# Patient Record
Sex: Female | Born: 1940 | Race: Black or African American | Hispanic: No | Smoking: Former smoker
Health system: Southern US, Community
[De-identification: ages and names within clinical notes are randomized; demographics above are authoritative.]

## PROBLEM LIST (undated history)

## (undated) DIAGNOSIS — I10 Essential (primary) hypertension: Secondary | ICD-10-CM

## (undated) DIAGNOSIS — E785 Hyperlipidemia, unspecified: Secondary | ICD-10-CM

## (undated) DIAGNOSIS — M1990 Unspecified osteoarthritis, unspecified site: Secondary | ICD-10-CM

## (undated) DIAGNOSIS — H269 Unspecified cataract: Secondary | ICD-10-CM

## (undated) DIAGNOSIS — C801 Malignant (primary) neoplasm, unspecified: Secondary | ICD-10-CM

## (undated) HISTORY — DX: Hyperlipidemia, unspecified: E78.5

## (undated) HISTORY — PX: UPPER GASTROINTESTINAL ENDOSCOPY: SHX188

## (undated) HISTORY — DX: Essential (primary) hypertension: I10

## (undated) HISTORY — DX: Unspecified cataract: H26.9

## (undated) HISTORY — DX: Malignant (primary) neoplasm, unspecified: C80.1

## (undated) HISTORY — PX: COLONOSCOPY: SHX174

## (undated) HISTORY — DX: Unspecified osteoarthritis, unspecified site: M19.90

## (undated) HISTORY — PX: OTHER SURGICAL HISTORY: SHX169

---

## 2020-03-28 HISTORY — PX: ESOPHAGOGASTRODUODENOSCOPY: SHX1529

## 2020-05-24 ENCOUNTER — Encounter: Payer: Self-pay | Admitting: *Deleted

## 2020-05-24 NOTE — Progress Notes (Signed)
Reached out to Vanessa Garrett, and spoke to her daughter, [name], to introduce myself as the office RN Navigator and explain our new patient process. Reviewed the reason for their referral and scheduled their new patient appointment along with labs. Provided address and directions to the office including call back phone number. Reviewed with patient any concerns they may have or any possible barriers to attending their appointment.   Records from recent hospitalization in [place] obtained and printed for Dr Marin Olp.   Oncology Nurse Navigator Documentation  Oncology Nurse Navigator Flowsheets 05/24/2020  Abnormal Finding Date 05/17/2020  Navigator Follow Up Date: 05/28/2020  Navigator Follow Up Reason: New Patient Appointment  Navigator Location [facility]  Referral Date to RadOnc/MedOnc 05/21/2020  Navigator Encounter Type Introductory Phone Call  Patient Visit Type MedOnc  Treatment Phase Abnormal Labs;Abnormal Scans  Barriers/Navigation Needs Coordination of Care;Education  Education Other  Interventions Coordination of Care;Education  Acuity Level 2-Minimal Needs (1-2 Barriers Identified)  Coordination of Care Appts  Education Method Verbal  Support Groups/Services Friends and Family  Time Spent with Patient 48

## 2020-05-28 ENCOUNTER — Inpatient Hospital Stay (HOSPITAL_BASED_OUTPATIENT_CLINIC_OR_DEPARTMENT_OTHER): Payer: Medicare Other | Admitting: Hematology & Oncology

## 2020-05-28 ENCOUNTER — Encounter: Payer: Self-pay | Admitting: Hematology & Oncology

## 2020-05-28 ENCOUNTER — Inpatient Hospital Stay: Payer: Medicare Other | Attending: Hematology & Oncology

## 2020-05-28 ENCOUNTER — Other Ambulatory Visit: Payer: Self-pay

## 2020-05-28 ENCOUNTER — Encounter: Payer: Self-pay | Admitting: *Deleted

## 2020-05-28 VITALS — BP 173/70 | HR 80 | Temp 98.6°F | Resp 18 | Ht 65.5 in | Wt 175.0 lb

## 2020-05-28 DIAGNOSIS — C221 Intrahepatic bile duct carcinoma: Secondary | ICD-10-CM

## 2020-05-28 DIAGNOSIS — R160 Hepatomegaly, not elsewhere classified: Secondary | ICD-10-CM

## 2020-05-28 DIAGNOSIS — Z79899 Other long term (current) drug therapy: Secondary | ICD-10-CM | POA: Insufficient documentation

## 2020-05-28 DIAGNOSIS — C787 Secondary malignant neoplasm of liver and intrahepatic bile duct: Secondary | ICD-10-CM | POA: Insufficient documentation

## 2020-05-28 DIAGNOSIS — C779 Secondary and unspecified malignant neoplasm of lymph node, unspecified: Secondary | ICD-10-CM | POA: Diagnosis not present

## 2020-05-28 DIAGNOSIS — Z7189 Other specified counseling: Secondary | ICD-10-CM

## 2020-05-28 DIAGNOSIS — K769 Liver disease, unspecified: Secondary | ICD-10-CM | POA: Diagnosis not present

## 2020-05-28 DIAGNOSIS — D500 Iron deficiency anemia secondary to blood loss (chronic): Secondary | ICD-10-CM

## 2020-05-28 DIAGNOSIS — R978 Other abnormal tumor markers: Secondary | ICD-10-CM | POA: Insufficient documentation

## 2020-05-28 DIAGNOSIS — D509 Iron deficiency anemia, unspecified: Secondary | ICD-10-CM | POA: Diagnosis not present

## 2020-05-28 DIAGNOSIS — Z87891 Personal history of nicotine dependence: Secondary | ICD-10-CM | POA: Diagnosis not present

## 2020-05-28 DIAGNOSIS — IMO0001 Reserved for inherently not codable concepts without codable children: Secondary | ICD-10-CM | POA: Insufficient documentation

## 2020-05-28 DIAGNOSIS — C23 Malignant neoplasm of gallbladder: Secondary | ICD-10-CM | POA: Diagnosis not present

## 2020-05-28 DIAGNOSIS — Z789 Other specified health status: Secondary | ICD-10-CM

## 2020-05-28 DIAGNOSIS — K909 Intestinal malabsorption, unspecified: Secondary | ICD-10-CM | POA: Insufficient documentation

## 2020-05-28 HISTORY — DX: Iron deficiency anemia secondary to blood loss (chronic): D50.0

## 2020-05-28 HISTORY — DX: Other specified counseling: Z71.89

## 2020-05-28 HISTORY — DX: Other specified health status: Z78.9

## 2020-05-28 HISTORY — DX: Reserved for inherently not codable concepts without codable children: IMO0001

## 2020-05-28 HISTORY — DX: Hepatomegaly, not elsewhere classified: R16.0

## 2020-05-28 HISTORY — DX: Intestinal malabsorption, unspecified: K90.9

## 2020-05-28 LAB — CMP (CANCER CENTER ONLY)
ALT: 6 U/L (ref 0–44)
AST: 15 U/L (ref 15–41)
Albumin: 3.7 g/dL (ref 3.5–5.0)
Alkaline Phosphatase: 109 U/L (ref 38–126)
Anion gap: 7 (ref 5–15)
BUN: 14 mg/dL (ref 8–23)
CO2: 31 mmol/L (ref 22–32)
Calcium: 9.3 mg/dL (ref 8.9–10.3)
Chloride: 100 mmol/L (ref 98–111)
Creatinine: 0.72 mg/dL (ref 0.44–1.00)
GFR, Est AFR Am: >60 mL/min (ref >60)
GFR, Estimated: >60 mL/min (ref >60)
Glucose, Bld: 111 mg/dL — ABNORMAL HIGH (ref 70–99)
Potassium: 3.3 mmol/L — ABNORMAL LOW (ref 3.5–5.1)
Sodium: 138 mmol/L (ref 135–145)
Total Bilirubin: 0.2 mg/dL — ABNORMAL LOW (ref 0.3–1.2)
Total Protein: 7 g/dL (ref 6.5–8.1)

## 2020-05-28 LAB — CBC WITH DIFFERENTIAL (CANCER CENTER ONLY)
Abs Immature Granulocytes: 0.07 10*3/uL (ref 0.00–0.07)
Basophils Absolute: 0.1 10*3/uL (ref 0.0–0.1)
Basophils Relative: 1 %
Eosinophils Absolute: 0.1 10*3/uL (ref 0.0–0.5)
Eosinophils Relative: 1 %
HCT: 31.7 % — ABNORMAL LOW (ref 36.0–46.0)
Hemoglobin: 9.3 g/dL — ABNORMAL LOW (ref 12.0–15.0)
Immature Granulocytes: 1 %
Lymphocytes Relative: 21 %
Lymphs Abs: 1.1 10*3/uL (ref 0.7–4.0)
MCH: 23.7 pg — ABNORMAL LOW (ref 26.0–34.0)
MCHC: 29.3 g/dL — ABNORMAL LOW (ref 30.0–36.0)
MCV: 80.9 fL (ref 80.0–100.0)
Monocytes Absolute: 0.5 10*3/uL (ref 0.1–1.0)
Monocytes Relative: 10 %
Neutro Abs: 3.4 10*3/uL (ref 1.7–7.7)
Neutrophils Relative %: 66 %
Platelet Count: 335 10*3/uL (ref 150–400)
RBC: 3.92 MIL/uL (ref 3.87–5.11)
RDW: 16.6 % — ABNORMAL HIGH (ref 11.5–15.5)
WBC Count: 5.2 10*3/uL (ref 4.0–10.5)
nRBC: 0 % (ref 0.0–0.2)

## 2020-05-28 LAB — RETICULOCYTES
Immature Retic Fract: 10 % (ref 2.3–15.9)
RBC.: 3.88 MIL/uL (ref 3.87–5.11)
Retic Count, Absolute: 46.9 10*3/uL (ref 19.0–186.0)
Retic Ct Pct: 1.2 % (ref 0.4–3.1)

## 2020-05-28 LAB — PREALBUMIN: Prealbumin: 15.3 mg/dL — ABNORMAL LOW (ref 18–38)

## 2020-05-28 NOTE — Progress Notes (Signed)
Referral MD  Reason for Referral: Hepatobiliary tumor-biopsy results still pending; severe iron deficiency anemia-Jehovah's Witness  Chief Complaint  Patient presents with  . Follow-up  : I had very low blood and was in the hospital for 3 weeks.  HPI: [name] is an incredibly charming 79 year old African-American female.  She comes in with a daughter.  A son and daughter-in-law are on the cell phone.  She has recently moved down here from [place].  As such, we had a great time talking about [place] since I went to medical school in [place] and lived in [place].  She apparently was incredibly weak and had to go to the hospital.  This was in July.  She was found to have a hemoglobin of 2.3.  I suspect she was GI bleeding.  She only had an upper endoscopy.  I am not sure why colonoscopy was not done.  She is a Restaurant manager, fast food.  She cannot receive blood.  She was given ESA and iron.  This has helped.  While hospitalized, she had a CT scan of the abdomen.  This showed a mass in the liver.  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  A stack of records came with her.  Unfortunately I cannot find anything in the records about tumor markers or biopsies.  Her daughter said that she did have a biopsy of the liver as an outpatient.  We will have to call up to her hospital and her oncologist to see if we can get information.  She looks quite healthy.  She obviously has a strong constitution to be able to manage a hemoglobin of 2.3.  She has had no real weight loss.  I think she may have lost weight while in the hospital.  She has had a decreased appetite.  She says that food just does not taste all that good.  She has had no diarrhea.  She used to smoke but stopped over 50 years ago.  Quality of life clearly is our primary goal.  Currently, her performance status is ECOG 1.   History reviewed. No pertinent past medical  history.:  History reviewed. No pertinent surgical history.:   Current Outpatient Medications:  .  Iron-Vitamins (S.S.S. TONIC PO), Take 45 mLs by mouth every other day., Disp: , Rfl:  .  NON FORMULARY, Take 2 tablets by mouth every other day. Mega Food - Blood Builder, Disp: , Rfl:  .  NON FORMULARY, 1.25 mg every other day. Beet Root Powder, Disp: , Rfl:  .  metoprolol tartrate (LOPRESSOR) 12.5 mg TABS tablet, Take 12.5 mg by mouth 2 (two) times daily., Disp: , Rfl:  .  vitamin B-12 (CYANOCOBALAMIN) 1000 MCG tablet, Take 1,000 mcg by mouth daily., Disp: , Rfl: :  :  No Known Allergies:  History reviewed. No pertinent family history.:  Social History   Socioeconomic History  . Marital status: Divorced    Spouse name: Not on file  . Number of children: Not on file  . Years of education: Not on file  . Highest education level: Not on file  Occupational History  . Not on file  Tobacco Use  . Smoking status: Former Smoker    Packs/day: 0.25    Years: 10.00    Pack years: 2.50    Types: Cigarettes    Quit date: 08/26/1969    Years since quitting: 50.7  . Smokeless tobacco: Never Used  Vaping Use  . Vaping Use:  Never used  Substance and Sexual Activity  . Alcohol use: Not on file  . Drug use: Not on file  . Sexual activity: Not on file  Other Topics Concern  . Not on file  Social History Narrative  . Not on file   Social Determinants of Health   Financial Resource Strain:   . Difficulty of Paying Living Expenses: Not on file  Food Insecurity:   . Worried About Charity fundraiser in the Last Year: Not on file  . Ran Out of Food in the Last Year: Not on file  Transportation Needs:   . Lack of Transportation (Medical): Not on file  . Lack of Transportation (Non-Medical): Not on file  Physical Activity:   . Days of Exercise per Week: Not on file  . Minutes of Exercise per Session: Not on file  Stress:   . Feeling of Stress : Not on file  Social Connections:   .  Frequency of Communication with Friends and Family: Not on file  . Frequency of Social Gatherings with Friends and Family: Not on file  . Attends Religious Services: Not on file  . Active Member of Clubs or Organizations: Not on file  . Attends Archivist Meetings: Not on file  . Marital Status: Not on file  Intimate Partner Violence:   . Fear of Current or Ex-Partner: Not on file  . Emotionally Abused: Not on file  . Physically Abused: Not on file  . Sexually Abused: Not on file  :  Review of Systems  Constitutional: Positive for malaise/fatigue.  HENT: Negative.   Eyes: Negative.   Respiratory: Negative.   Cardiovascular: Negative.   Gastrointestinal: Negative.   Genitourinary: Negative.   Musculoskeletal: Negative.   Skin: Negative.   Neurological: Negative.   Endo/Heme/Allergies: Negative.   Psychiatric/Behavioral: Negative.      Exam:  Well-developed well-nourished African-American female in no obvious distress.  Vital signs show temperature of 98.6.  Pulse 80.  Blood pressure 173/70.  Weight is 175 pounds.  Head and neck exam shows no ocular or oral lesions.  Conjunctiva are slightly pale.  She has no scleral icterus.  There is no adenopathy in the neck or supraclavicular regions.  Lungs are clear bilaterally.  Cardiac exam regular rate and rhythm with no murmurs, rubs or bruits.  Abdomen is soft.  She has decent bowel sounds.  Her liver edge is palpable about 3 to 4 cm below the right costal margin.  There is no palpable spleen tip.  There is no fluid wave in the abdomen.  Back exam shows no tenderness over the spine, ribs or hips.  Extremities shows no clubbing or cyanosis.  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  She has decent range of motion of her joints.  She has good strength bilaterally.  Skin exam shows no rashes, ecchymoses or petechia.  Neurological exam shows no focal neurological deficits.   @IPVITALS @   Recent Labs    05/28/20 1528  WBC  5.2  HGB 9.3*  HCT 31.7*  PLT 335   Recent Labs    05/28/20 1528  NA 138  K 3.3*  CL 100  CO2 31  GLUCOSE 111*  BUN 14  CREATININE 0.72  CALCIUM 9.3    Blood smear review: None  Pathology: Pending    Assessment and Plan: [name] is a very charming 79 year old African-American female.  She has the main problem being this hepatic mass.  Again she had a  biopsy done.  Will have to speak to her oncologist up in [place] and see if we can get information about this mass.  If it is a primary liver or biliary cancer, we really need to make sure molecular markers have been sent.  We will have to get scans down here so we know what is going on.  I will get an MRI of her abdomen and a CT of her chest.  This will give [place] an idea as to how extensive this tumor involves her liver.  We did send off some tumor markers.  We will see what this shows.  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  She has been given IV iron.  We will go ahead and give her IV iron.  She may or may not need all ESA.  I think that what ever we are dealing with in the liver, we are not going to cure.  We can certainly treat this but we really need to know what the pathology is.  Again our primary goal is quality of life.  I want to make sure that we focus on that for Ms. Alroy Dust.  Of note, she will need a colonoscopy.  We will have to see if the gastroenterologists across the hall will be able to see her and do a colonoscopy quickly.  We will set her up with IV iron.  We will do this weekly.  Again, we may or may not need ESA.  I spent a good 60 minutes with Ms. Hester and her daughter.  I answered all their questions.  She is delightful.  It was really nice talking to her.  She has a very strong faith.  I did give her a prayer blanket and she was grateful for this.  We will plan to get her back probably in a few weeks depending on what we find with the  biopsy results up in [place].

## 2020-05-28 NOTE — Progress Notes (Signed)
Initial RN Navigator Patient Visit  Name: Vanessa Garrett Date of Referral : 05/21/20 Diagnosis: Cholangiocarcinoma  Met with patient prior to their visit with MD. Hanley Seamen patient MD and Navigator business card. Reviewed with patient the general overview of expected course after initial diagnosis and time frame for all steps to be completed.  Patient completed visit with Dr. Marin Olp  Will review patient's appointment when I return to the office on Tuesday and continue to coordinate all patient needs.   Patient understands all follow up procedures and expectations. They have my number to reach out for any further clarification or additional needs.   Oncology Nurse Navigator Documentation  Oncology Nurse Navigator Flowsheets 05/28/2020  Abnormal Finding Date -  Navigator Follow Up Date: 06/01/2020  Navigator Follow Up Reason: Appointment Review  Navigator Location [facility]  Referral Date to RadOnc/MedOnc -  Navigator Encounter Type Initial MedOnc  Patient Visit Type MedOnc  Treatment Phase Pre-Tx/Tx Discussion  Barriers/Navigation Needs Coordination of Care;Education  Education Other  Interventions Coordination of Care;Education  Acuity Level 2-Minimal Needs (1-2 Barriers Identified)  Coordination of Care -  Education Method Verbal  Support Groups/Services Friends and Family  Time Spent with Patient 30

## 2020-05-30 LAB — IRON AND TIBC
Iron: 91 ug/dL (ref 28–170)
Saturation Ratios: 26 % (ref 10.4–31.8)
TIBC: 353 ug/dL (ref 250–450)
UIBC: 262 ug/dL

## 2020-05-30 LAB — FERRITIN: Ferritin: 48 ng/mL (ref 11–307)

## 2020-05-31 ENCOUNTER — Other Ambulatory Visit: Payer: Self-pay

## 2020-05-31 DIAGNOSIS — Z1211 Encounter for screening for malignant neoplasm of colon: Secondary | ICD-10-CM

## 2020-05-31 DIAGNOSIS — K921 Melena: Secondary | ICD-10-CM

## 2020-05-31 LAB — CEA (IN HOUSE-CHCC): CEA (CHCC-In House): 2.06 ng/mL (ref 0.00–5.00)

## 2020-06-01 ENCOUNTER — Encounter: Payer: Self-pay | Admitting: *Deleted

## 2020-06-01 LAB — CANCER ANTIGEN 19-9: CA 19-9: 2205 U/mL — ABNORMAL HIGH (ref 0–35)

## 2020-06-01 NOTE — Progress Notes (Signed)
Reviewed patient's new patient appointment.   Dr Marin Olp would like to talk to Dr Courtney Paris with [facility] in [place]. Able to connect them via the telephone. Dr Marin Olp would like [place] to determine if molecular studies have been sent. We received a message from [place] with Dr Courtney Paris that [place] had been sent on the sample, and that we should have results in 7-10 days. Contact: Wells Guiles [phone])  Patient needs a CT and MRI. Radiology called because patient is receiving IV iron and they prefer to scheduled MRIs at least 3 months out from IV iron. Called and spoke to Dr Kris Hartmann at [facility] and discussed the case with him. 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 We cannot stop for 3 months. He agreed that in this case, we should proceed with the MRI, being aware that we may have suboptimal images. Notified the radiology department and they will schedule.   Oncology Nurse Navigator Documentation  Oncology Nurse Navigator Flowsheets 06/01/2020  Abnormal Finding Date -  Navigator Follow Up Date: 06/28/2020  Navigator Follow Up Reason: Follow-up Appointment  Navigator Location [facility]  Referral Date to RadOnc/MedOnc -  Navigator Encounter Type Appt/Treatment Plan Review;Telephone  Telephone Outgoing Call  Patient Visit Type MedOnc  Treatment Phase Pre-Tx/Tx Discussion  Barriers/Navigation Needs Coordination of Care;Education  Education -  Interventions Coordination of Care;Other  Acuity Level 2-Minimal Needs (1-2 Barriers Identified)  Coordination of Care Appts;Radiology;Other  Education Method -  Support Groups/Services Friends and Family  Time Spent with Patient 8

## 2020-06-02 ENCOUNTER — Ambulatory Visit (INDEPENDENT_AMBULATORY_CARE_PROVIDER_SITE_OTHER): Payer: Medicare Other | Admitting: Gastroenterology

## 2020-06-02 ENCOUNTER — Encounter: Payer: Self-pay | Admitting: Gastroenterology

## 2020-06-02 VITALS — BP 142/68 | HR 84 | Ht 65.5 in | Wt 175.1 lb

## 2020-06-02 DIAGNOSIS — K921 Melena: Secondary | ICD-10-CM | POA: Diagnosis not present

## 2020-06-02 DIAGNOSIS — C249 Malignant neoplasm of biliary tract, unspecified: Secondary | ICD-10-CM | POA: Diagnosis not present

## 2020-06-02 DIAGNOSIS — D509 Iron deficiency anemia, unspecified: Secondary | ICD-10-CM | POA: Diagnosis not present

## 2020-06-02 NOTE — Patient Instructions (Signed)
If you are age 79 or older, your body mass index should be between 23-30. Your Body mass index is 28.7 kg/m. If this is out of the aforementioned range listed, please consider follow up with your Primary Care Provider.  If you are age 2 or younger, your body mass index should be between 19-25. Your Body mass index is 28.7 kg/m. If this is out of the aformentioned range listed, please consider follow up with your Primary Care Provider.   You have been scheduled for a colonoscopy. Please follow written instructions given to you at your visit today.  Please pick up your prep supplies at the pharmacy within the next 1-3 days. If you use inhalers (even only as needed), please bring them with you on the day of your procedure.  You have been given a Clenpiq prep.  It was a pleasure to see you today!  Vito Cirigliano, D.O.

## 2020-06-02 NOTE — Progress Notes (Signed)
Chief Complaint: Iron deficiency anemia, hepatobiliary tumor   Referring Provider:     Burney Gauze, MD   HPI:     Vanessa Garrett is a 79 y.o. female referred to the Gastroenterology Clinic for evaluation of iron deficiency anemia and recently diagnosed hepatobiliary tumor.  She was hospitalized 03/16/2020 with weakness, fatigue, symptomatic anemia with admission hemoglobin of 2.8.  Per patient, was hospitalized x3 weeks, treated with Epo, IV iron. Was evaluated with EGD as below, but colonoscopy was not done.  She was discharged with hemoglobin 8.3, and has since moved from the [place] to [facility].  -CTA abdomen/pelvis (03/16/2020): 1. Extensive hepatobiliary abnormalities as described consistent with malignancy. Amorphous intraductal mass centered at the hepatic hilum and extending throughout the extrahepatic biliary ducts to the level of the ampulla, and within the gallbladder, most likely cholangiocarcinoma or less likely gallbladder carcinoma with extension into the ducts.  2. Associated severe obstructive intrahepatic ductal dilatation.  3. 2 ill-defined masses in the medial aspect of the right hepatic lobe measuring up to4.6 cm, consistent with metastatic disease.  4. Enlarged portacaval lymph node which may be metastatic.  5. No evidence of active arterial GI bleeding.  6. Fibroid uterus. Small left adnexal cyst which could be followed up with ultrasound in 12 months.  7. Cardiomegaly. Evidence of anemia.  -EGD (04/05/2020): Normal  -Ultrasound-guided liver biopsy (05/10/2020): Moderate to poorly differentiated adenocarcinoma.  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 Marin Olp in the Oncology clinic on 05/28/2020.  Send off tumor markers/labs as below and additional imaging with MRI abdomen and CT chest (pending).  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  Referred to GI clinic for expedited  colonoscopy which is scheduled for tomorrow.  -CEA normal -CA 19-9 2205 -Ferritin 48, iron 91, TIBC 353, sat 26% -H/H 9.3/31.7 with MCV/RDW 81/16.6 -K 3.3 otherwise normal CMP  Today, states prior to hospital admission had intermittent dark stools, then brisk marroon stools just prior to admission. No prior similar sxs. No prior colonsocopy.  Since hospital discharge, has had intermittent small-volume hematochezia.  No known family history of CRC, GI malignancy, liver disease, pancreatic disease, or IBD.    Past Medical History:  Diagnosis Date  . Goals of care, counseling/discussion 05/28/2020  . 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  . 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 Liver mass 05/28/2020  . Patient is Jehovah's Witness 05/28/2020     Past Surgical History:  Procedure Laterality Date  . ESOPHAGOGASTRODU[facility]  [facility]   Family History  Problem Relation Age of Onset  . Cancer Maternal Aunt        not sure the type  . Colon cancer Neg Hx    Social History   Tobacco Use  . Smoking status: Former Smoker    Packs/day: 0.25    Years: 10.00    Pack years: 2.50    Types: Cigarettes    Quit date: 08/26/1969    Years since quitting: 50.8  . Smokeless tobacco: Never Used  Vaping Use  . Vaping Use: Never used  Substance Use Topics  . Alcohol use: Not Currently  . Drug use: Never   Current Outpatient Medications  Medication Sig Dispense Refill  . Iron-Vitamins (S.S.S. TONIC PO) Take 45 mLs by mouth every other day.    . metoprolol tartrate (  LOPRESSOR) 25 MG tablet Take 12.5 mg by mouth 2 (two) times daily.    . NON FORMULARY Take 2 tablets by mouth every other day. Mega Food - Blood Builder    . NON FORMULARY 1.25 mg every other day. Beet Root Powder    . vitamin B-12 (CYANOCOBALAMIN) 1000 MCG tablet Take 1,000 mcg by mouth daily.    Marland Kitchen VITAMIN D PO Take 2 tablets by mouth daily.     No current facility-administered  medications for this visit.   No Known Allergies   Review of Systems: All systems reviewed and negative except where noted in HPI.     Physical Exam:    Wt Readings from Last 3 Encounters:  06/02/20 175 lb 2 oz (79.4 kg)  05/28/20 175 lb (79.4 kg)    BP (!) 142/68   Pulse 84   Ht 5' 5.5" (1.664 m)   Wt 175 lb 2 oz (79.4 kg)   BMI 28.70 kg/m  Constitutional:  Pleasant, in no acute distress. Psychiatric: Normal mood and affect. Behavior is normal. EENT: Pupils normal.  Conjunctivae are normal. No scleral icterus. Neck supple. No cervical LAD. Cardiovascular: Normal rate, regular rhythm. No edema Pulmonary/chest: Effort normal and breath sounds normal. No wheezing, rales or rhonchi. Abdominal: Palpable mass with TTP in RUQ.  No rebound or guarding.  No peritoneal signs.  Soft, nondistended. Bowel sounds active throughout. There are no masses palpable. No hepatomegaly. Neurological: Alert and oriented to person place and time. Skin: Skin is warm and dry. No rashes noted. Rectal: Exam deferred by patient to time of colonoscopy.    ASSESSMENT AND PLAN;   1) Iron deficiency anemia 2) Hematochezia -Plan for colonoscopy as scheduled tomorrow for completion of IDA work-up along with evaluation of hematochezia -Continue serial CBC checks with additional IV iron as needed -Patient is Jehovah's Witness  3) Hepatobiliary tumor with apparent regional metastasis -Was seen in the Oncology clinic by Dr. Marin Olp last week.  Biopsies requested and I was able to review some of those records from [place] today.  Liver biopsy consistent with hepatobiliary adenocarcinoma -CMP otherwise normal and without evidence of obstruction   The indications, risks, and benefits of colonoscopy were explained to the patient in detail. Risks include but are not limited to bleeding, perforation, adverse reaction to medications, and cardiopulmonary compromise. Sequelae include but are not limited to the  possibility of surgery, hospitalization, and mortality. The patient verbalized understanding and wished to proceed. All questions answered, referred to the scheduler and bowel prep ordered. Further recommendations pending results of the exam.     Lavena Bullion, DO, FACG  06/02/2020, 10:31 AM   Einar Pheasant, DO

## 2020-06-03 ENCOUNTER — Encounter: Payer: Self-pay | Admitting: Gastroenterology

## 2020-06-03 ENCOUNTER — Other Ambulatory Visit: Payer: Self-pay

## 2020-06-03 ENCOUNTER — Ambulatory Visit (AMBULATORY_SURGERY_CENTER): Payer: Medicare Other | Admitting: Gastroenterology

## 2020-06-03 VITALS — BP 164/88 | HR 68 | Temp 97.9°F | Resp 21 | Ht 65.5 in | Wt 175.0 lb

## 2020-06-03 DIAGNOSIS — K641 Second degree hemorrhoids: Secondary | ICD-10-CM

## 2020-06-03 DIAGNOSIS — K921 Melena: Secondary | ICD-10-CM | POA: Diagnosis not present

## 2020-06-03 DIAGNOSIS — D128 Benign neoplasm of rectum: Secondary | ICD-10-CM

## 2020-06-03 DIAGNOSIS — D509 Iron deficiency anemia, unspecified: Secondary | ICD-10-CM

## 2020-06-03 DIAGNOSIS — K635 Polyp of colon: Secondary | ICD-10-CM

## 2020-06-03 DIAGNOSIS — D125 Benign neoplasm of sigmoid colon: Secondary | ICD-10-CM

## 2020-06-03 DIAGNOSIS — K5730 Diverticulosis of large intestine without perforation or abscess without bleeding: Secondary | ICD-10-CM

## 2020-06-03 MED ORDER — SODIUM CHLORIDE 0.9 % IV SOLN: 500.0000 mL | Freq: Once | INTRAVENOUS | Status: DC

## 2020-06-03 NOTE — Progress Notes (Signed)
Called daughter to bedside, pt w/o complaints, passing flatus, no bleeding.  Explained pt has 2 clips per rectum and clip card given. Instructions

## 2020-06-03 NOTE — Progress Notes (Signed)
To PACU, VSS. Report to [email or web address] 

## 2020-06-03 NOTE — Patient Instructions (Signed)
Handouts given for diverticulosis, hemorrhoids and polyps.  YOU HAD AN ENDOSCOPIC PROCEDURE TODAY AT [facility]:   Refer to the procedure report that was given to you for any specific questions about what was found during the examination.  If the procedure report does not answer your questions, please call your gastroenterologist to clarify.  If you requested that your care partner not be given the details of your procedure findings, then the procedure report has been included in a sealed envelope for you to review at your convenience later.  YOU SHOULD EXPECT: Some feelings of bloating in the abdomen. Passage of more gas than usual.  Walking can help get rid of the air that was put into your GI tract during the procedure and reduce the bloating. If you had a lower endoscopy (such as a colonoscopy or flexible sigmoidoscopy) you may notice spotting of blood in your stool or on the toilet paper. If you underwent a bowel prep for your procedure, you may not have a normal bowel movement for a few days.  Please Note:  You might notice some irritation and congestion in your nose or some drainage.  This is from the oxygen used during your procedure.  There is no need for concern and it should clear up in a day or so.  SYMPTOMS TO REPORT IMMEDIATELY:   Following lower endoscopy (colonoscopy or flexible sigmoidoscopy):  Excessive amounts of blood in the stool  Significant tenderness or worsening of abdominal pains  Swelling of the abdomen that is new, acute  Fever of 100F or higher  For urgent or emergent issues, a gastroenterologist can be reached at any hour by calling [phone]. Do not use MyChart messaging for urgent concerns.    DIET:  We do recommend a small meal at first, but then you may proceed to your regular diet.  Drink plenty of fluids but you should avoid alcoholic beverages for 24 hours.  ACTIVITY:  You should plan to take it easy for the rest of today and you  should NOT DRIVE or use heavy machinery until tomorrow (because of the sedation medicines used during the test).    FOLLOW UP: Our staff will call the number listed on your records 48-72 hours following your procedure to check on you and address any questions or concerns that you may have regarding the information given to you following your procedure. If we do not reach you, we will leave a message.  We will attempt to reach you two times.  During this call, we will ask if you have developed any symptoms of COVID 19. If you develop any symptoms (ie: fever, flu-like symptoms, shortness of breath, cough etc.) before then, please call [phone].  If you test positive for Covid 19 in the 2 weeks post procedure, please call and report this information to [place].    If any biopsies were taken you will be contacted by phone or by letter within the next 1-3 weeks.  Please call us at [phone] if you have not heard about the biopsies in 3 weeks.    SIGNATURES/CONFIDENTIALITY: You and/or your care partner have signed paperwork which will be entered into your electronic medical record.  These signatures attest to the fact that that the information above on your After Visit Summary has been reviewed and is understood.  Full responsibility of the confidentiality of this discharge information lies with you and/or your care-partner.

## 2020-06-03 NOTE — Progress Notes (Signed)
Called to room to assist during endoscopic procedure.  Patient ID and intended procedure confirmed with present staff. Received instructions for my participation in the procedure from the performing physician.  

## 2020-06-03 NOTE — Progress Notes (Signed)
VS- Vanessa Garrett 

## 2020-06-03 NOTE — Op Note (Signed)
[facility] Patient Name: Vanessa Garrett Procedure Date: 06/03/2020 3:41 PM MRN: [id number] Endoscopist: Gerrit Heck , MD Age: 79 Referring MD:  Date of Birth: June 29, 1941 Gender: Female Account #: [id number] Procedure:                Colonoscopy Indications:              Hematochezia, Iron deficiency anemia, Abnormal CT                            of the GI tract Medicines:                Monitored Anesthesia Care Procedure:                Pre-Anesthesia Assessment:                           - Prior to the procedure, a History and Physical                            was performed, and patient medications and                            allergies were reviewed. The patient's tolerance of                            previous anesthesia was also reviewed. The risks                            and benefits of the procedure and the sedation                            options and risks were discussed with the patient.                            All questions were answered, and informed consent                            was obtained. Prior Anticoagulants: The patient has                            taken no previous anticoagulant or antiplatelet                            agents. ASA Grade Assessment: II - A patient with                            mild systemic disease. After reviewing the risks                            and benefits, the patient was deemed in                            satisfactory condition to undergo the procedure.  After obtaining informed consent, the colonoscope                            was passed under direct vision. Throughout the                            procedure, the patient's blood pressure, pulse, and                            oxygen saturations were monitored continuously. The                            Colonoscope was introduced through the anus and                            advanced to the the terminal ileum. The  colonoscopy                            was performed without difficulty. The patient                            tolerated the procedure well. The quality of the                            bowel preparation was good. The terminal ileum,                            ileocecal valve, appendiceal orifice, and rectum                            were photographed. Scope In: 3:47:02 PM Scope Out: 4:14:29 PM Scope Withdrawal Time: 0 hours 19 minutes 44 seconds  Total Procedure Duration: 0 hours 27 minutes 27 seconds  Findings:                 Hemorrhoids and skin tags were found on perianal                            exam.                           A 4 mm polyp was found in the sigmoid colon. The                            polyp was sessile. The polyp was removed with a                            cold snare. Resection and retrieval were complete.                            Estimated blood loss was minimal.                           A localized area of congested, erythematous and  granular mucosa was found in the distal rectum.                            Biopsies were taken with a cold forceps for                            histology. There was prolonged bleeding at the                            biopsy site. For hemostasis, two hemostatic clips                            were successfully placed. There was no bleeding at                            the end of the procedure.                           Non-bleeding internal hemorrhoids were found during                            retroflexion. The hemorrhoids were small and Grade                            II (internal hemorrhoids that prolapse but reduce                            spontaneously).                           Multiple small and large-mouthed diverticula were                            found in the sigmoid colon.                           The exam was otherwise normal throughout the                             remainder of the colon.                           The terminal ileum appeared normal. Complications:            No immediate complications. Estimated Blood Loss:     Estimated blood loss was minimal. Impression:               - Hemorrhoids found on perianal exam.                           - One 4 mm polyp in the sigmoid colon, removed with                            a cold snare. Resected and retrieved.                           -  Congested, erythematous and granular mucosa in                            the distal rectum. Biopsied. Clips were placed.                           - Non-bleeding internal hemorrhoids.                           - Diverticulosis in the sigmoid colon.                           - The examined portion of the ileum was normal. Recommendation:           - Patient has a contact number available for                            emergencies. The signs and symptoms of potential                            delayed complications were discussed with the                            patient. Return to normal activities tomorrow.                            Written discharge instructions were provided to the                            patient.                           - Resume previous diet.                           - Continue present medications.                           - Await pathology results.                           - Return to GI clinic at appointment to be                            scheduled.                           - Follow-up with Dr. Marin Olp in the                            Hematology-Oncology Clinic as scheduled.                           - Proceed with additional imaging studies this                            month as scheduled. [employer],  MD 06/03/2020 4:23:54 PM

## 2020-06-04 ENCOUNTER — Inpatient Hospital Stay: Payer: Medicare Other

## 2020-06-04 VITALS — BP 180/78 | HR 66 | Temp 97.9°F | Resp 18

## 2020-06-04 DIAGNOSIS — K909 Intestinal malabsorption, unspecified: Secondary | ICD-10-CM

## 2020-06-04 DIAGNOSIS — C23 Malignant neoplasm of gallbladder: Secondary | ICD-10-CM | POA: Diagnosis not present

## 2020-06-04 DIAGNOSIS — D500 Iron deficiency anemia secondary to blood loss (chronic): Secondary | ICD-10-CM

## 2020-06-04 MED ORDER — SODIUM CHLORIDE 0.9 % IV SOLN
300.0000 mg | Freq: Once | INTRAVENOUS | Status: AC
  Administered 2020-06-04: 300 mg via INTRAVENOUS
  Filled 2020-06-04: qty 15

## 2020-06-04 MED ORDER — SODIUM CHLORIDE 0.9 % IV SOLN
Freq: Once | INTRAVENOUS | Status: AC
  Filled 2020-06-04: qty 250

## 2020-06-07 ENCOUNTER — Telehealth: Payer: Self-pay

## 2020-06-07 ENCOUNTER — Telehealth: Payer: Self-pay | Admitting: *Deleted

## 2020-06-07 NOTE — Telephone Encounter (Signed)
First attempt follow up call to pt, lm on vm 

## 2020-06-07 NOTE — Telephone Encounter (Signed)
No answer for post procedure call back. Unable to leave message. 

## 2020-06-11 ENCOUNTER — Inpatient Hospital Stay: Payer: Medicare Other

## 2020-06-11 ENCOUNTER — Other Ambulatory Visit: Payer: Self-pay

## 2020-06-11 ENCOUNTER — Encounter: Payer: Self-pay | Admitting: *Deleted

## 2020-06-11 VITALS — BP 187/89 | HR 70 | Temp 98.3°F | Resp 17

## 2020-06-11 DIAGNOSIS — D500 Iron deficiency anemia secondary to blood loss (chronic): Secondary | ICD-10-CM

## 2020-06-11 DIAGNOSIS — C23 Malignant neoplasm of gallbladder: Secondary | ICD-10-CM | POA: Diagnosis not present

## 2020-06-11 DIAGNOSIS — K909 Intestinal malabsorption, unspecified: Secondary | ICD-10-CM

## 2020-06-11 MED ORDER — SODIUM CHLORIDE 0.9 % IV SOLN
Freq: Once | INTRAVENOUS | Status: AC
  Filled 2020-06-11: qty 250

## 2020-06-11 MED ORDER — SODIUM CHLORIDE 0.9 % IV SOLN
300.0000 mg | Freq: Once | INTRAVENOUS | Status: AC
  Administered 2020-06-11: 300 mg via INTRAVENOUS
  Filled 2020-06-11: qty 15

## 2020-06-11 NOTE — Patient Instructions (Signed)

## 2020-06-11 NOTE — Progress Notes (Signed)
CARIS report received from oncologist in [place]. Dr Marin Olp has a copy, and a copy placed into the scan bin.   Oncology Nurse Navigator Documentation  Oncology Nurse Navigator Flowsheets 06/11/2020  Abnormal Finding Date -  Navigator Follow Up Date: 06/28/2020  Navigator Follow Up Reason: Follow-up Appointment  Navigator Location [facility]  Referral Date to RadOnc/MedOnc -  Navigator Encounter Type Molecular Studies  Telephone -  Patient Visit Type MedOnc  Treatment Phase Pre-Tx/Tx Discussion  Barriers/Navigation Needs Coordination of Care;Education  Education -  Interventions Other  Acuity Level 2-Minimal Needs (1-2 Barriers Identified)  Coordination of Care Other  Education Method -  Support Groups/Services Friends and Family  Time Spent with Patient 15

## 2020-06-12 ENCOUNTER — Encounter (HOSPITAL_BASED_OUTPATIENT_CLINIC_OR_DEPARTMENT_OTHER): Payer: Self-pay

## 2020-06-12 ENCOUNTER — Ambulatory Visit (HOSPITAL_BASED_OUTPATIENT_CLINIC_OR_DEPARTMENT_OTHER)
Admission: RE | Admit: 2020-06-12 | Discharge: 2020-06-12 | Disposition: A | Discharge status: Home / Self Care | Payer: Medicare Other | Source: Ambulatory Visit | Attending: Hematology & Oncology | Admitting: Hematology & Oncology

## 2020-06-12 DIAGNOSIS — R160 Hepatomegaly, not elsewhere classified: Secondary | ICD-10-CM | POA: Diagnosis present

## 2020-06-12 DIAGNOSIS — K769 Liver disease, unspecified: Secondary | ICD-10-CM | POA: Insufficient documentation

## 2020-06-12 IMAGING — CT CT CHEST W/ CM
2 of 4 series · 15 of 36 positions shown, 18 images · IV contrast (omnipaque)
Comparison: None.

CLINICAL DATA: Liver mass, for staging

EXAM:
CT CHEST WITH CONTRAST
TECHNIQUE: Multidetector CT imaging of the chest was performed during
intravenous contrast administration.
CONTRAST:  75mL OMNIPAQUE IOHEXOL 300 MG/ML  SOLN

[Series 2: axial st · axial · 0.87mm/px · z∈[-295,-49]mm · 12 of 145 slices shown, 15 images]
[im 11/145  mediastinal]
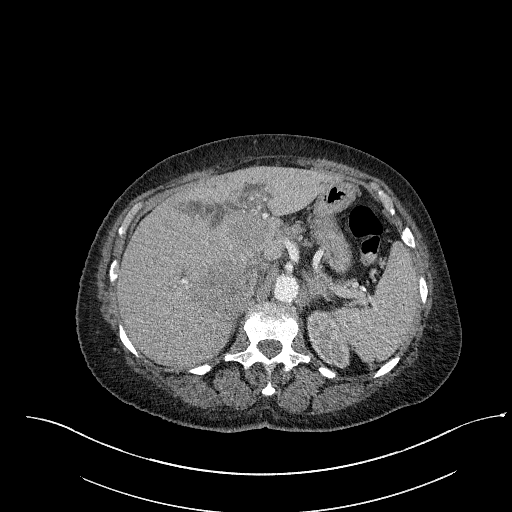
[im 11/145  lung]
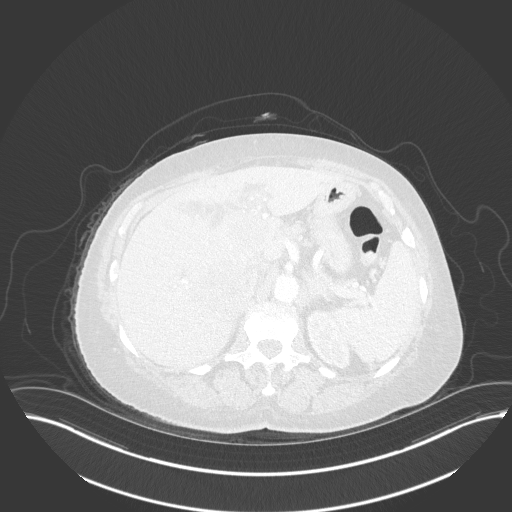
[im 21/145  lung]
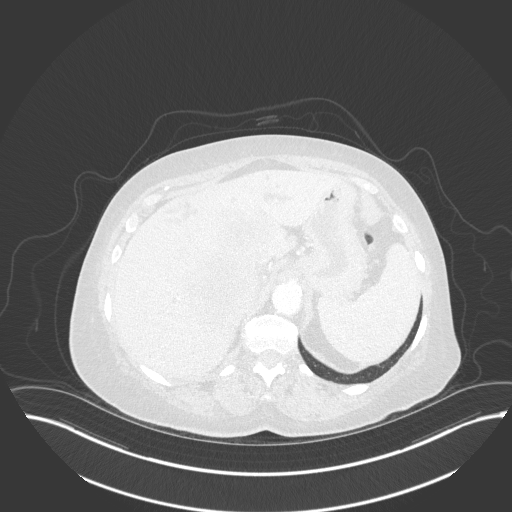
[im 31/145  lung]
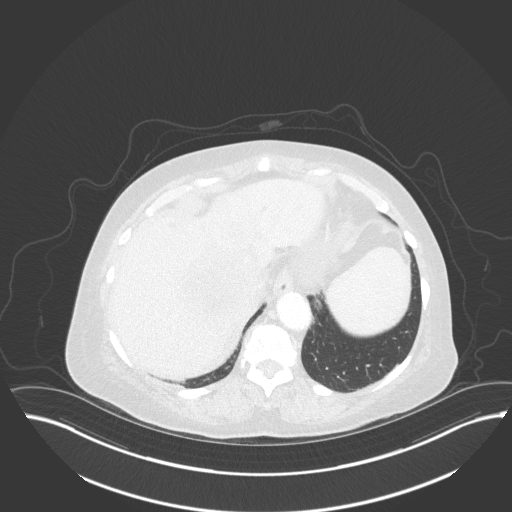
[im 42/145  lung]
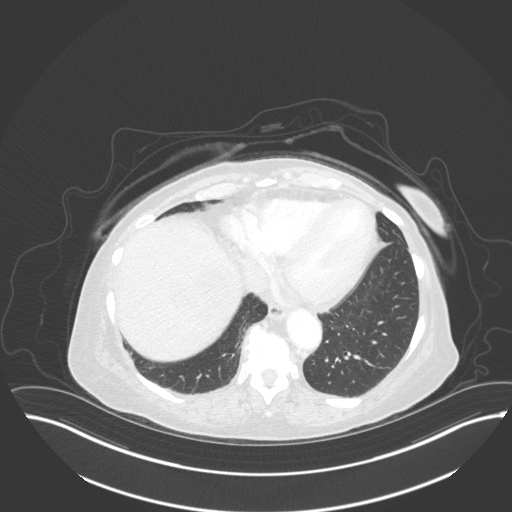
[im 52/145  mediastinal]
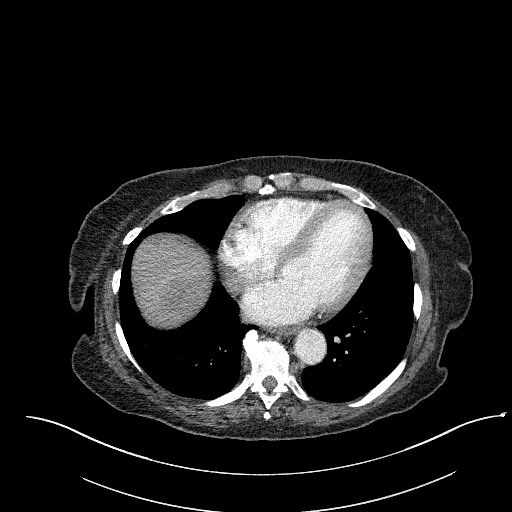
[im 52/145  lung]
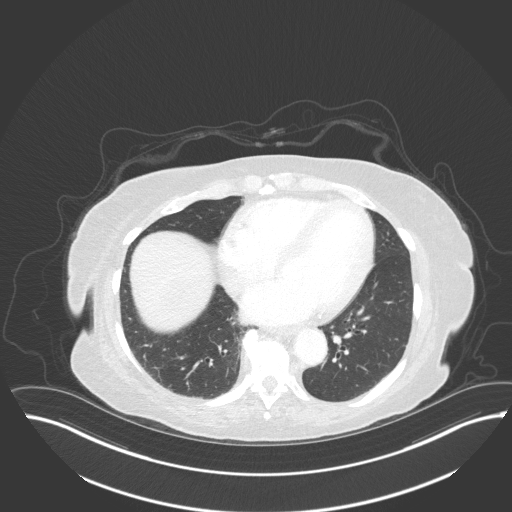
[im 62/145  lung]
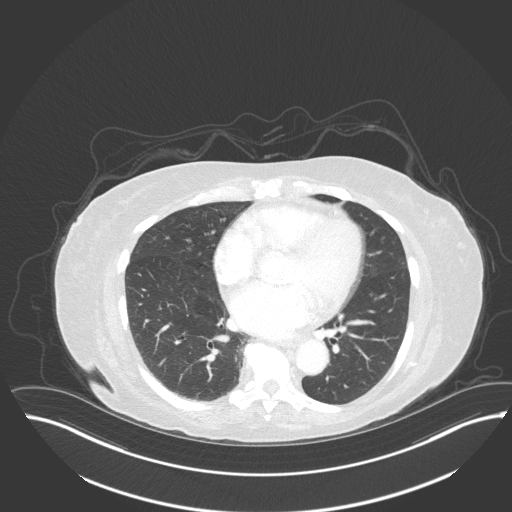
[im 83/145  lung]
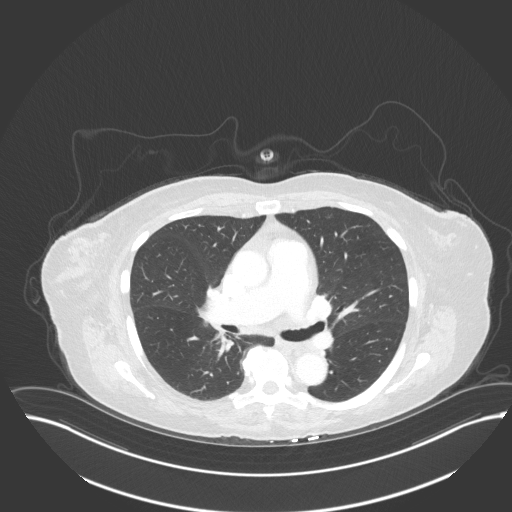
[im 93/145  lung]
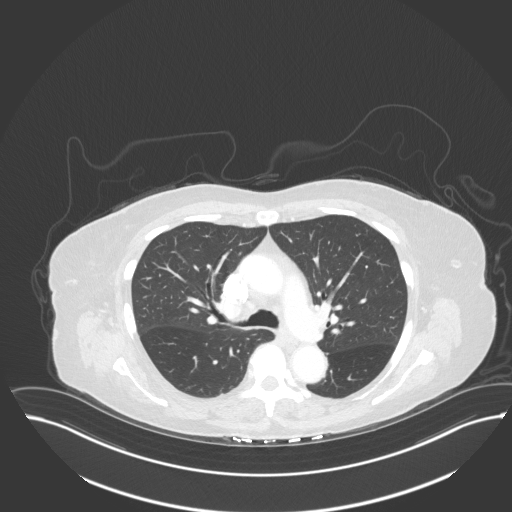
[im 103/145  mediastinal]
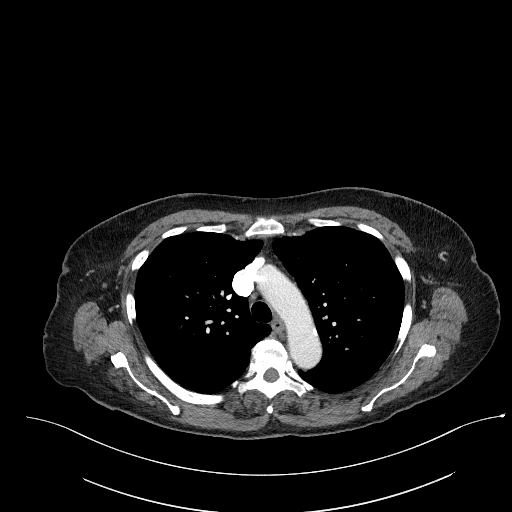
[im 103/145  lung]
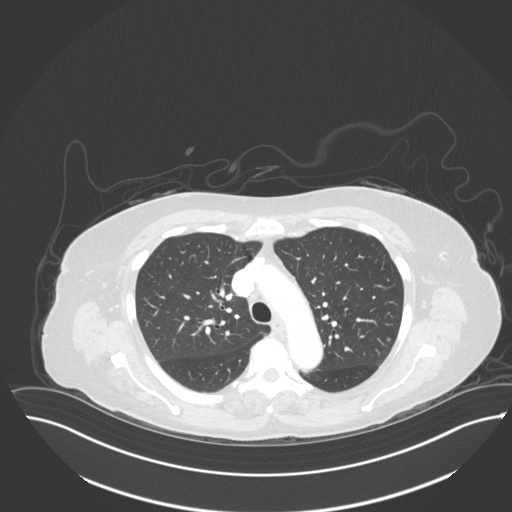
[im 114/145  lung]
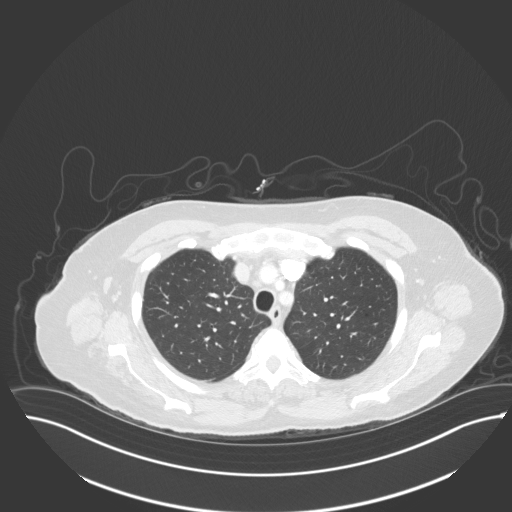
[im 124/145  lung]
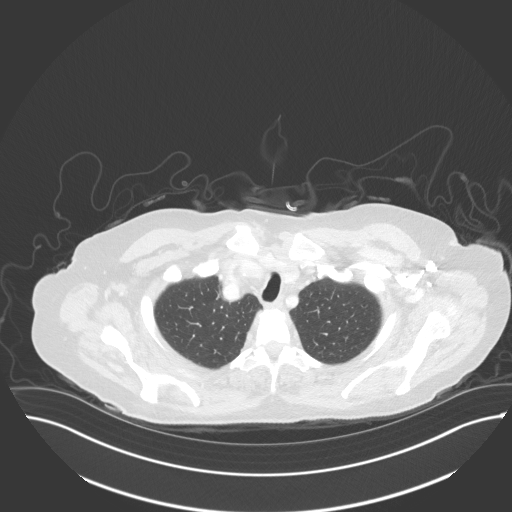
[im 134/145  lung]
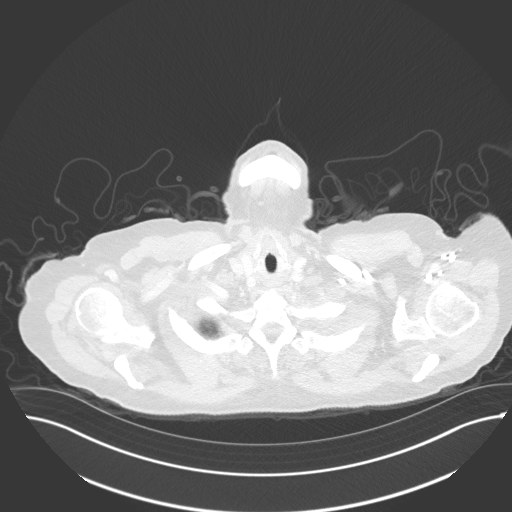

[Series 5: coronal · coronal · 0.60mm/px · 3 of 128 slices shown]
[im 26/128  lung]
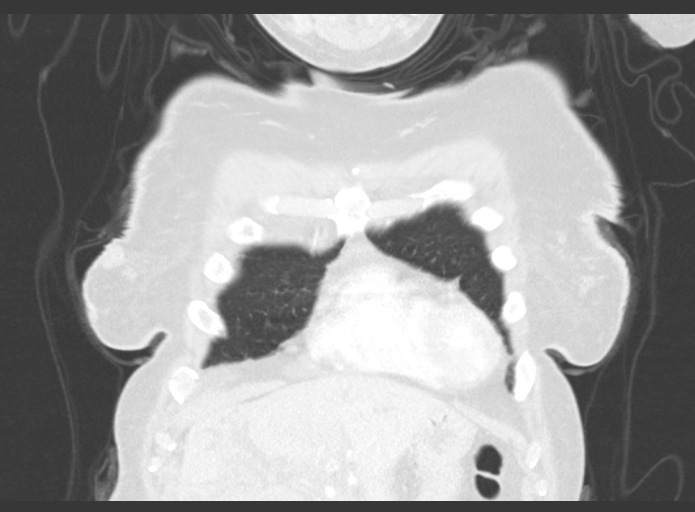
[im 51/128  lung]
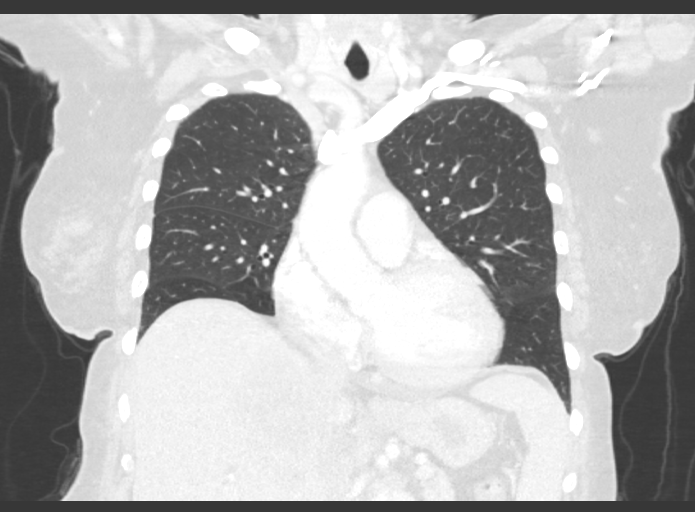
[im 77/128  lung]
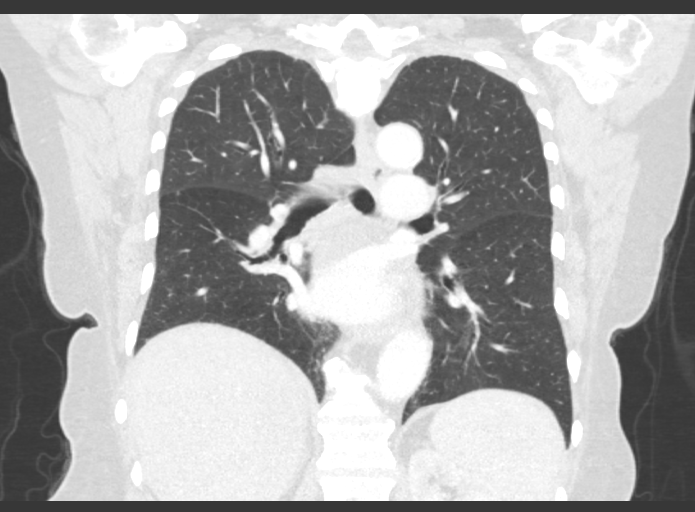

[15 of 36 positions shown; findings below may reference images not displayed]

FINDINGS: Cardiovascular: The heart is normal in size. No pericardial
effusion.

No evidence of thoracic aortic aneurysm. Atherosclerotic
calcifications of the aortic arch.

Mild three-vessel coronary atherosclerosis.

Mediastinum/Nodes: Small mediastinal lymph nodes which do not meet
pathologic CT size criteria.

Visualized thyroid is unremarkable.

Lungs/Pleura: No suspicious pulmonary nodules.

No focal consolidation. Mild compressive atelectasis in the medial
right lower lobe.

No pleural effusion or pneumothorax.

Upper Abdomen: Visualized upper abdomen is notable for multifocal
hepatic masses measuring up to 6.9 cm in segment 5 (series 2/image
117). Intrahepatic ductal dilatation within the central left hepatic
lobe (series 2/image 137). These findings are better evaluated on
concurrent MRI abdomen.

Musculoskeletal: Degenerative changes of the visualized
thoracolumbar spine.
IMPRESSION: No evidence of metastatic disease in the chest.

Multifocal hepatic masses, better evaluated on concurrent MRI
abdomen.

Aortic Atherosclerosis ([I7]-[I7]).

## 2020-06-12 IMAGING — MR MR ABDOMEN WO/W CM
8 of 18 series · 18 of 48 positions shown · IV contrast (gadavist)
Comparison: None.

CLINICAL DATA: Rectal bleeding. Liver masses, favoring primary
hepatobiliary malignancy on outside report.

EXAM:
MRI ABDOMEN WITHOUT AND WITH CONTRAST
TECHNIQUE: Multiplanar multisequence MR imaging of the abdomen was performed
both before and after the administration of intravenous contrast.
CONTRAST:  8mL GADAVIST GADOBUTROL 1 MMOL/ML IV SOLN

[Series 3: T2 fat-sat · axial · 6.0mm · 1.48mm/px · 1 of 30 slices shown]
[im 1/30]
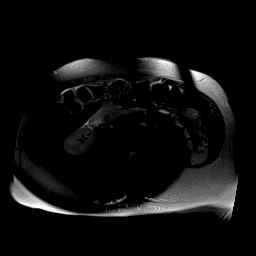

[Series 4: ep2d_diff_b50_500_800 free breathing · axial · 6.0mm · 1.98mm/px · z∈[-82,+135]mm · 4 of 90 slices shown]
[im 1/90]
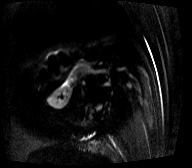
[im 30/90]
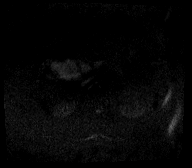
[im 60/90]
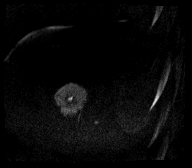
[im 90/90]
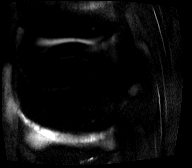

[Series 5: ep2d_diff_b50_500_800 free breathing_adc · axial · 6.0mm · 1.98mm/px · 1 of 30 slices shown]
[im 1/30]
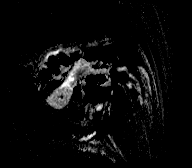

[Series 6: T2 · coronal · 7.0mm · 1.56mm/px · 2 of 36 slices shown (1 of 2)]
[im 1/36]
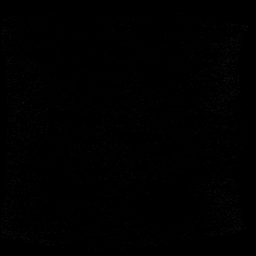
[im 36/36]
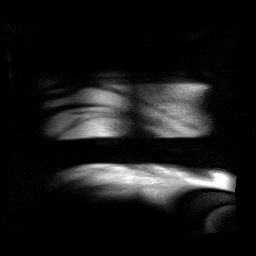

[Series 9: DWI · axial · 6.0mm · 0.82mm/px · z∈[-146,+131]mm · 2 of 38 slices shown]
[im 1/38]
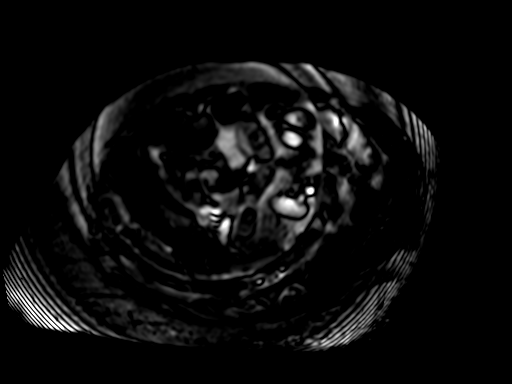
[im 38/38]
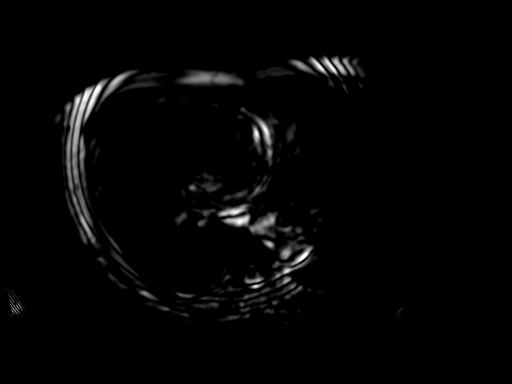

[Series 11: acr-in + out · axial · 6.0mm · 0.74mm/px · z∈[-138,+124]mm · 3 of 72 slices shown]
[im 1/72]
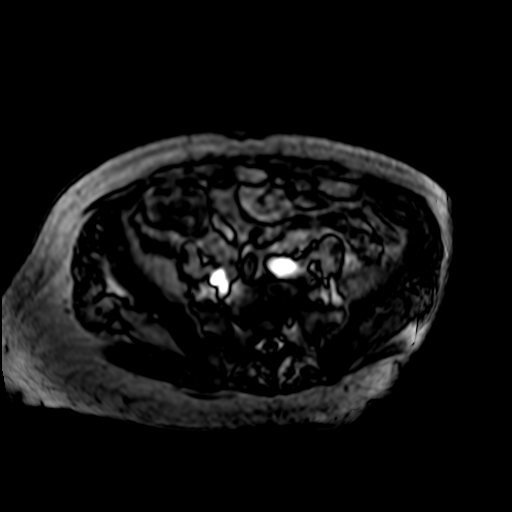
[im 36/72]
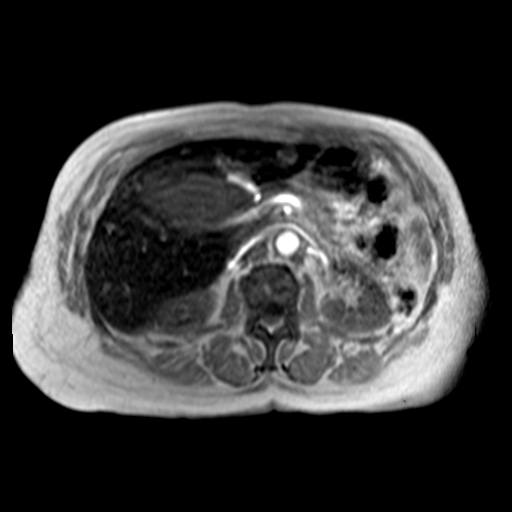
[im 72/72]
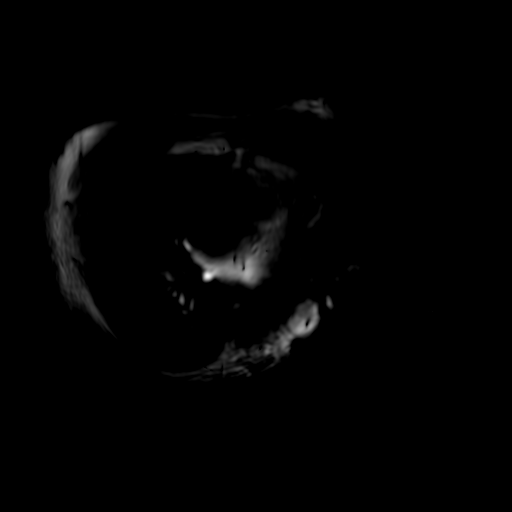

[Series 24: T2 · axial · 6.0mm · 1.56mm/px · z∈[-122,+141]mm · 2 of 36 slices shown (2 of 2)]
[im 1/36]
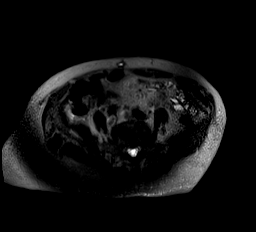
[im 36/36]
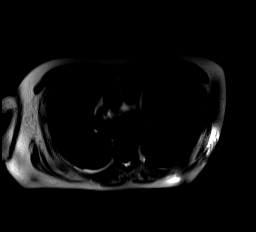

[Series 5003: sub_3 min · axial · 4.4mm · 0.74mm/px · z∈[-168,+144]mm · 3 of 72 slices shown]
[im 1/72]
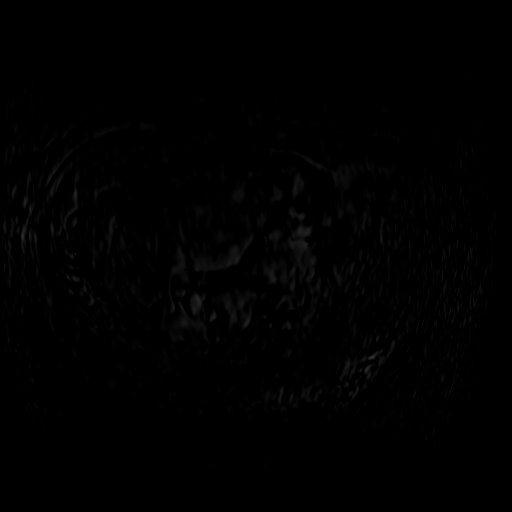
[im 36/72]
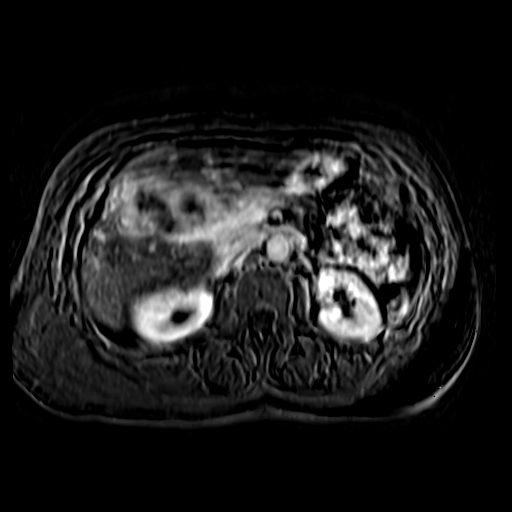
[im 72/72]
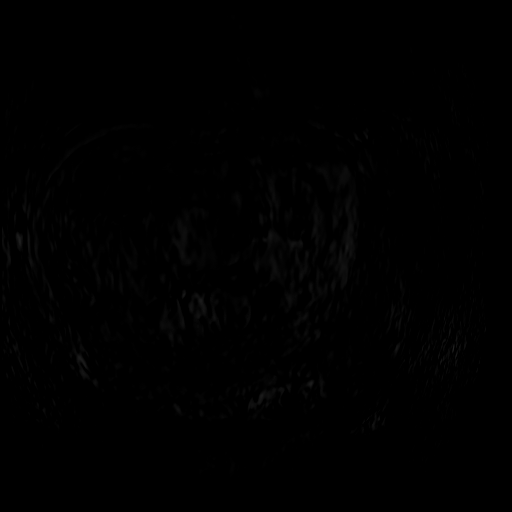

[18 of 48 positions shown; findings below may reference images not displayed]

FINDINGS: Motion degraded imaging, particularly on the dynamic postcontrast
imaging, which constraints evaluation.

Lower chest: Visualized lungs are clear.

Hepatobiliary: Irregular soft tissue/enhancing mass in the expected
location of the gallbladder, measuring 8.0 x 6.1 x 7.6 cm (series
24/image 11), suspicious for gallbladder adenocarcinoma. If the
patient is status post cholecystectomy, a central cholangiocarcinoma
extruding into the gallbladder fossa would be the other diagnostic
consideration.

Mild central intrahepatic ductal dilatation in the left hepatic lobe
(series 3/image 18).

Three enhancing masses in the liver, compatible with metastatic
disease, as follows:

--3.1 x 3.7 cm lesion in segment 7 (series 3/image 5)

--6.9 x 6.7 cm lesion in segment [DATE] (series 3/image 12)

--5.1 x 4.1 cm lesion in segment 4B (series 3/image 15)

No morphologic findings of cirrhosis. Signal loss on inphase
imaging, raising concern for iron deposition/hemosiderosis.

Pancreas:  Within normal limits.

Spleen:  Within normal limits.

Adrenals/Urinary Tract:  Adrenal glands are within normal limits.

Kidneys are within normal limits.  No hydronephrosis.

Stomach/Bowel: Stomach and visualized bowel are grossly
unremarkable.

Vascular/Lymphatic:  No evidence of abdominal aortic aneurysm.

Upper abdominal nodes measuring up to 11 mm short axis (series
3/images 15, 19, and 23). 9 mm short axis left para-aortic node
(series 3/image 22).

Other:  No abdominal ascites.

Musculoskeletal: No focal osseous lesions.
IMPRESSION: Motion degraded images.

8.0 cm mass in the expected location of the gallbladder, suspicious
for gallbladder adenocarcinoma. If the patient is status post
cholecystectomy, a central cholangiocarcinoma extruding into the
gallbladder fossa would be the other diagnostic consideration. Mild
central intrahepatic ductal dilatation in the left hepatic lobe.

Three hepatic metastases measuring up to 6.9 cm, as above.

Suspected upper abdominal/retroperitoneal nodal metastases measuring
up to 11 mm short axis.

## 2020-06-12 MED ORDER — GADOBUTROL 1 MMOL/ML IV SOLN
8.0000 mL | Freq: Once | INTRAVENOUS | Status: AC | PRN
  Administered 2020-06-12: 8 mL via INTRAVENOUS

## 2020-06-12 MED ORDER — IOHEXOL 300 MG/ML  SOLN
100.0000 mL | Freq: Once | INTRAMUSCULAR | Status: AC | PRN
  Administered 2020-06-12: 75 mL via INTRAVENOUS

## 2020-06-14 ENCOUNTER — Encounter: Payer: Self-pay | Admitting: Gastroenterology

## 2020-06-14 ENCOUNTER — Other Ambulatory Visit: Payer: Medicare Other

## 2020-06-14 ENCOUNTER — Ambulatory Visit: Payer: Medicare Other | Admitting: Hematology & Oncology

## 2020-06-14 ENCOUNTER — Other Ambulatory Visit (HOSPITAL_BASED_OUTPATIENT_CLINIC_OR_DEPARTMENT_OTHER): Payer: Medicare Other

## 2020-06-14 ENCOUNTER — Ambulatory Visit: Payer: Medicare Other

## 2020-06-15 ENCOUNTER — Encounter: Payer: Self-pay | Admitting: *Deleted

## 2020-06-15 NOTE — Progress Notes (Signed)
Oncology Nurse Navigator Documentation  Oncology Nurse Navigator Flowsheets 06/15/2020  Abnormal Finding Date -  Navigator Follow Up Date: 06/28/2020  Navigator Follow Up Reason: Follow-up Appointment  Navigator Location [facility]  Referral Date to RadOnc/MedOnc -  Navigator Encounter Type Scan Review  Telephone -  Patient Visit Type MedOnc  Treatment Phase Pre-Tx/Tx Discussion  Barriers/Navigation Needs Coordination of Care;Education  Education -  Interventions None Required  Acuity Level 2-Minimal Needs (1-2 Barriers Identified)  Coordination of Care -  Education Method -  Support Groups/Services Friends and Family  Time Spent with Patient 15

## 2020-06-17 ENCOUNTER — Encounter: Payer: Self-pay | Admitting: *Deleted

## 2020-06-17 ENCOUNTER — Other Ambulatory Visit: Payer: Self-pay | Admitting: Hematology & Oncology

## 2020-06-17 NOTE — Progress Notes (Signed)
Vanessa Garrett, patient's daughter, calling for treatment plan update. Patient has had all her scans, and colonoscopy. Her CARIS report has returned and was scanned into the chart. Vanessa Garrett would like to know what treatment recommendations Dr Marin Olp has. Currently, patient's next appointment isn't until 06/28/2020.  Spoke with daughter and informed her that Dr Marin Olp is out of the office this week. Explained that I would talk to Dr Marin Olp Monday morning and let her know what the next steps would be. She appreciated the information.   Oncology Nurse Navigator Documentation  Oncology Nurse Navigator Flowsheets 06/17/2020  Abnormal Finding Date -  Navigator Follow Up Date: 06/21/2020  Navigator Follow Up Reason: Appointment Review  Navigator Location [facility]  Referral Date to RadOnc/MedOnc -  Navigator Encounter Type Telephone  Telephone Incoming Call;Appt Confirmation/Clarification  Patient Visit Type MedOnc  Treatment Phase Pre-Tx/Tx Discussion  Barriers/Navigation Needs Coordination of Care;Education  Education Other  Interventions Coordination of Care;Psycho-Social Support  Acuity Level 2-Minimal Needs (1-2 Barriers Identified)  Coordination of Care -  Education Method Verbal  Support Groups/Services Friends and Family  Time Spent with Patient 30

## 2020-06-18 ENCOUNTER — Other Ambulatory Visit: Payer: Self-pay

## 2020-06-18 ENCOUNTER — Inpatient Hospital Stay: Payer: Medicare Other

## 2020-06-18 VITALS — BP 184/61 | HR 69 | Temp 98.6°F | Resp 17

## 2020-06-18 DIAGNOSIS — C23 Malignant neoplasm of gallbladder: Secondary | ICD-10-CM | POA: Diagnosis not present

## 2020-06-18 DIAGNOSIS — K909 Intestinal malabsorption, unspecified: Secondary | ICD-10-CM

## 2020-06-18 DIAGNOSIS — D500 Iron deficiency anemia secondary to blood loss (chronic): Secondary | ICD-10-CM

## 2020-06-18 MED ORDER — SODIUM CHLORIDE 0.9 % IV SOLN
300.0000 mg | Freq: Once | INTRAVENOUS | Status: AC
  Administered 2020-06-18: 300 mg via INTRAVENOUS
  Filled 2020-06-18: qty 15

## 2020-06-18 MED ORDER — SODIUM CHLORIDE 0.9 % IV SOLN
Freq: Once | INTRAVENOUS | Status: AC
  Filled 2020-06-18: qty 250

## 2020-06-18 NOTE — Patient Instructions (Signed)

## 2020-06-21 ENCOUNTER — Encounter: Payer: Self-pay | Admitting: *Deleted

## 2020-06-21 ENCOUNTER — Other Ambulatory Visit: Payer: Self-pay

## 2020-06-21 ENCOUNTER — Inpatient Hospital Stay: Payer: Medicare Other

## 2020-06-21 ENCOUNTER — Encounter: Payer: Self-pay | Admitting: Hematology & Oncology

## 2020-06-21 ENCOUNTER — Inpatient Hospital Stay (HOSPITAL_BASED_OUTPATIENT_CLINIC_OR_DEPARTMENT_OTHER): Payer: Medicare Other | Admitting: Hematology & Oncology

## 2020-06-21 DIAGNOSIS — D500 Iron deficiency anemia secondary to blood loss (chronic): Secondary | ICD-10-CM

## 2020-06-21 DIAGNOSIS — C23 Malignant neoplasm of gallbladder: Secondary | ICD-10-CM | POA: Diagnosis not present

## 2020-06-21 DIAGNOSIS — K909 Intestinal malabsorption, unspecified: Secondary | ICD-10-CM

## 2020-06-21 DIAGNOSIS — R160 Hepatomegaly, not elsewhere classified: Secondary | ICD-10-CM

## 2020-06-21 HISTORY — DX: Malignant neoplasm of gallbladder: C23

## 2020-06-21 LAB — CBC WITH DIFFERENTIAL (CANCER CENTER ONLY)
Abs Immature Granulocytes: 0.06 10*3/uL (ref 0.00–0.07)
Basophils Absolute: 0 10*3/uL (ref 0.0–0.1)
Basophils Relative: 1 %
Eosinophils Absolute: 0 10*3/uL (ref 0.0–0.5)
Eosinophils Relative: 1 %
HCT: 36.8 % (ref 36.0–46.0)
Hemoglobin: 10.7 g/dL — ABNORMAL LOW (ref 12.0–15.0)
Immature Granulocytes: 1 %
Lymphocytes Relative: 18 %
Lymphs Abs: 1.1 10*3/uL (ref 0.7–4.0)
MCH: 23.5 pg — ABNORMAL LOW (ref 26.0–34.0)
MCHC: 29.1 g/dL — ABNORMAL LOW (ref 30.0–36.0)
MCV: 80.9 fL (ref 80.0–100.0)
Monocytes Absolute: 0.5 10*3/uL (ref 0.1–1.0)
Monocytes Relative: 9 %
Neutro Abs: 4.2 10*3/uL (ref 1.7–7.7)
Neutrophils Relative %: 70 %
Platelet Count: 333 10*3/uL (ref 150–400)
RBC: 4.55 MIL/uL (ref 3.87–5.11)
RDW: 17.4 % — ABNORMAL HIGH (ref 11.5–15.5)
WBC Count: 6 10*3/uL (ref 4.0–10.5)
nRBC: 0 % (ref 0.0–0.2)

## 2020-06-21 LAB — CMP (CANCER CENTER ONLY)
ALT: 5 U/L (ref 0–44)
AST: 14 U/L — ABNORMAL LOW (ref 15–41)
Albumin: 3.6 g/dL (ref 3.5–5.0)
Alkaline Phosphatase: 104 U/L (ref 38–126)
Anion gap: 7 (ref 5–15)
BUN: 10 mg/dL (ref 8–23)
CO2: 32 mmol/L (ref 22–32)
Calcium: 9.4 mg/dL (ref 8.9–10.3)
Chloride: 99 mmol/L (ref 98–111)
Creatinine: 0.72 mg/dL (ref 0.44–1.00)
GFR, Estimated: >60 mL/min (ref >60)
Glucose, Bld: 133 mg/dL — ABNORMAL HIGH (ref 70–99)
Potassium: 3.6 mmol/L (ref 3.5–5.1)
Sodium: 138 mmol/L (ref 135–145)
Total Bilirubin: 0.3 mg/dL (ref 0.3–1.2)
Total Protein: 7.3 g/dL (ref 6.5–8.1)

## 2020-06-21 LAB — AMMONIA: Ammonia: 24 umol/L (ref 9–35)

## 2020-06-21 LAB — RETICULOCYTES
Immature Retic Fract: 13.4 % (ref 2.3–15.9)
RBC.: 4.39 MIL/uL (ref 3.87–5.11)
Retic Count, Absolute: 54.9 10*3/uL (ref 19.0–186.0)
Retic Ct Pct: 1.3 % (ref 0.4–3.1)

## 2020-06-21 NOTE — Progress Notes (Signed)
Spoke with Dr Marin Olp this morning. He would like to see patient today. Called and spoke to [name]. She is able to bring patient to an appointment this afternoon to discuss treatment plan updates.  Oncology Nurse Navigator Documentation  Oncology Nurse Navigator Flowsheets 06/21/2020  Abnormal Finding Date -  Navigator Follow Up Date: 06/21/2020  Navigator Follow Up Reason: Follow-up Appointment  Navigator Location [facility]  Referral Date to RadOnc/MedOnc -  Navigator Encounter Type Treatment;Appt/Treatment Plan Review  Telephone Outgoing Call  Patient Visit Type MedOnc  Treatment Phase Pre-Tx/Tx Discussion  Barriers/Navigation Needs Coordination of Care;Education  Education -  Interventions Coordination of Care  Acuity Level 2-Minimal Needs (1-2 Barriers Identified)  Coordination of Care Appts  Education Method -  Support Groups/Services Friends and Family  Time Spent with Patient 30

## 2020-06-21 NOTE — Progress Notes (Signed)
START OFF PATHWAY REGIMEN - Other   [id number]:Carboplatin + Gemcitabine (5/1,000) q21 Days:   A cycle is every 21 days:     Carboplatin      Gemcitabine   **Always confirm dose/schedule in your pharmacy ordering system**  Patient Characteristics: Intent of Therapy: Non-Curative / Palliative Intent, Discussed with Patient

## 2020-06-21 NOTE — Progress Notes (Signed)
Hematology and Oncology Follow Up Visit  Vanessa Garrett [id number] [birth year] 78 y.o. 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  We now have more information on her.  We get information sent down from Vanessa Garrett her oncologist up in [place].  She had a biopsy done.  This appeared to be consistent with gallbladder cancer.  Unfortunately, the molecular analysis did not show any molecular mutations that we could target.  She had an MRI done a couple weeks ago.  This is of the abdomen.  She has a large mass in the gallbladder measuring 8 x 6.1 x 7.6 cm.  She also has 3 liver masses.  The largest measures 6.9 x 6.7 cm.  There is some upper abdominal adenopathy.  A CT scan of the chest did not show any disease above the diaphragm.  She is still in pretty good shape.  I think that she would benefit from systemic chemotherapy.  Her son came in with her.  She had 2 daughters listen on the cell phone.  I told him that this is stage IV gallbladder cancer.  We can treat this but we cannot cure this.  I think that standard treatment would be platinum/gemcitabine.  I think given her age, we probably would use carboplatinum and not cis-platinum.  I suspect that cis-platinum is probably more effective but I just worried given her age that her kidneys would not be able to handle cis-platinum.  She will need to have a Port-A-Cath placed.  I talked to her about this.  She understands what a Port-A-Cath is.  We will see about getting this set up next week.  She is doing well with the iron.  She still has occasional bright red blood per rectum.  The hemoglobin is improving.  We give her weekly IV iron.  Her appetite is doing a  little better.  She is had no nausea or vomiting.  Currently, I would say her performance status is probably ECOG 1-2.  Medications:  Current Outpatient Medications:  .  Iron-Vitamins (S.S.S. TONIC PO), Take 45 mLs by mouth every other day., Disp: , Rfl:  .  metoprolol tartrate (LOPRESSOR) 25 MG tablet, Take 12.5 mg by mouth 2 (two) times daily., Disp: , Rfl:  .  NON FORMULARY, Take 2 tablets by mouth every other day. Mega Food - Blood Builder, Disp: , Rfl:  .  NON FORMULARY, 1.25 mg every other day. Beet Root Powder, Disp: , Rfl:  .  vitamin B-12 (CYANOCOBALAMIN) 1000 MCG tablet, Take 1,000 mcg by mouth daily., Disp: , Rfl:  .  VITAMIN D PO, Take 2 tablets by mouth daily., Disp: , Rfl:   Allergies: No Known Allergies  Past Medical History, Surgical history, Social history, and Family History were reviewed and updated.  Review of Systems: Review of Systems  Constitutional: Negative.   HENT:  Negative.   Eyes: Negative.   Respiratory: Negative.   Cardiovascular: Negative.   Gastrointestinal: Positive for blood in stool.  Endocrine: Negative.   Genitourinary: Negative.    Musculoskeletal: Negative.   Skin: Negative.   Neurological: Negative.   Hematological: Negative.   Psychiatric/Behavioral: Negative.     Physical Exam:  weight is  168 lb (76.2 kg). Her oral temperature is 98.5 F (36.9 C). Her blood pressure is 168/85 (abnormal) and her pulse is 75. Her respiration is 18 and oxygen saturation is 98%.   Wt Readings from Last 3 Encounters:  06/21/20 168 lb (76.2 kg)  06/03/20 175 lb (79.4 kg)  06/02/20 175 lb 2 oz (79.4 kg)    Physical Exam Vitals reviewed.  HENT:     Head: Normocephalic and atraumatic.  Eyes:     Pupils: Pupils are equal, round, and reactive to light.  Cardiovascular:     Rate and Rhythm: Normal rate and regular rhythm.     Heart sounds: Normal heart sounds.  Pulmonary:     Effort: Pulmonary effort is normal.     Breath sounds: Normal breath  sounds.  Abdominal:     General: Bowel sounds are normal.     Palpations: Abdomen is soft.  Musculoskeletal:        General: No tenderness or deformity. Normal range of motion.     Cervical back: Normal range of motion.  Lymphadenopathy:     Cervical: No cervical adenopathy.  Skin:    General: Skin is warm and dry.     Findings: No erythema or rash.  Neurological:     Mental Status: She is alert and oriented to person, place, and time.  Psychiatric:        Behavior: Behavior normal.        Thought Content: Thought content normal.        Judgment: Judgment normal.    Lab Results  Component Value Date   WBC 6.0 06/21/2020   HGB 10.7 (L) 06/21/2020   HCT 36.8 06/21/2020   MCV 80.9 06/21/2020   PLT 333 06/21/2020     Chemistry      Component Value Date/Time   NA 138 06/21/2020 1353   K 3.6 06/21/2020 1353   CL 99 06/21/2020 1353   CO2 32 06/21/2020 1353   BUN 10 06/21/2020 1353   CREATININE 0.72 06/21/2020 1353      Component Value Date/Time   CALCIUM 9.4 06/21/2020 1353   ALKPHOS 104 06/21/2020 1353   AST 14 (L) 06/21/2020 1353   ALT 5 06/21/2020 1353   BILITOT 0.3 06/21/2020 1353      Impression and Plan: Vanessa Garrett is a incredibly charming 79 year old African-American female.  She has stage IV adenocarcinoma of the gallbladder.  She is in decent shape so far.  Again, we will see if systemic chemotherapy will be able to help.  I will go ahead and start her on carboplatinum/Gemzar.  I do this is very reasonable considering her age and overall performance status.  If, she does not respond, then we might consider using FOLFOX.  There is no role for immunotherapy given the fact that her PD1 status is negative.  She does not have any actionable mutations.  I would give her 2 cycles of treatment and then repeat her MRI of the liver.  Forgot to mention that her CA 19-9 was 2200.  We certainly can use this as a marker for response.  She will need to have a  Port-A-Cath placed.  She will continue her weekly IV iron.  This is definitely helping her.  We'll try to get treatment started on November 5.  I will plan to see her back for her second cycle of treatment.  We'll try to treat after Thanksgiving her second cycle.   Volanda Napoleon, MD 10/25/20215:31 PM

## 2020-06-22 ENCOUNTER — Telehealth: Payer: Self-pay | Admitting: Hematology & Oncology

## 2020-06-22 ENCOUNTER — Other Ambulatory Visit: Payer: Self-pay | Admitting: Family

## 2020-06-22 ENCOUNTER — Encounter: Payer: Self-pay | Admitting: *Deleted

## 2020-06-22 LAB — IRON AND TIBC
Iron: 39 ug/dL — ABNORMAL LOW (ref 41–142)
Saturation Ratios: 16 % — ABNORMAL LOW (ref 21–57)
TIBC: 243 ug/dL (ref 236–444)
UIBC: 204 ug/dL (ref 120–384)

## 2020-06-22 LAB — FERRITIN: Ferritin: 300 ng/mL (ref 11–307)

## 2020-06-22 LAB — ERYTHROPOIETIN: Erythropoietin: 21.2 m[IU]/mL — ABNORMAL HIGH (ref 2.6–18.5)

## 2020-06-22 LAB — AFP TUMOR MARKER: AFP, Serum, Tumor Marker: 51.1 ng/mL — ABNORMAL HIGH (ref 0.0–8.3)

## 2020-06-22 NOTE — Progress Notes (Signed)
Plan is for patient to start chemotherapy next week. All necessary appointments have been made including chemo education, port placement, and treatment.   Scheduler spoke to patient's daughter regarding office appointments. Radiology spoke to daughter about port.   Oncology Nurse Navigator Documentation  Oncology Nurse Navigator Flowsheets 06/22/2020  Abnormal Finding Date -  Planned Course of Treatment Chemotherapy  Phase of Treatment Chemo  Navigator Follow Up Date: 07/02/2020  Navigator Follow Up Reason: Chemotherapy  Navigator Location [facility]  Referral Date to RadOnc/MedOnc -  Navigator Encounter Type Appt/Treatment Plan Review  Telephone -  Patient Visit Type MedOnc  Treatment Phase Pre-Tx/Tx Discussion  Barriers/Navigation Needs Coordination of Care;Education  Education -  Interventions Coordination of Care;Referrals  Acuity Level 2-Minimal Needs (1-2 Barriers Identified)  Referrals Other  Coordination of Care Appts  Education Method -  Support Groups/Services Friends and Family  Time Spent with Patient 65

## 2020-06-22 NOTE — Telephone Encounter (Signed)
Appointments scheduled and patient will get updated calendar at visit on 10/29/ per 10/25 los

## 2020-06-22 NOTE — Progress Notes (Signed)
Met with patient prior to her visit with Dr Marin Olp. She was accompanied by her son. Explained that once Dr Marin Olp places orders we will work toward getting everything scheduled for her. Will follow up with orders and patient tomorrow once Dr Marin Olp places all orders.  Oncology Nurse Navigator Documentation  Oncology Nurse Navigator Flowsheets 06/21/2020  Abnormal Finding Date -  Navigator Follow Up Date: 06/22/2020  Navigator Follow Up Reason: Appointment Review  Navigator Location [facility]  Referral Date to RadOnc/MedOnc -  Navigator Encounter Type Follow-up Appt  Telephone -  Patient Visit Type MedOnc  Treatment Phase Pre-Tx/Tx Discussion  Barriers/Navigation Needs Coordination of Care;Education  Education -  Interventions Psycho-Social Support  Acuity Level 2-Minimal Needs (1-2 Barriers Identified)  Coordination of Care -  Education Method -  Support Groups/Services Friends and Family  Time Spent with Patient 15

## 2020-06-23 ENCOUNTER — Other Ambulatory Visit: Payer: Self-pay | Admitting: *Deleted

## 2020-06-23 ENCOUNTER — Encounter: Payer: Self-pay | Admitting: *Deleted

## 2020-06-23 ENCOUNTER — Telehealth: Payer: Self-pay | Admitting: Hematology & Oncology

## 2020-06-23 MED ORDER — DRONABINOL 2.5 MG PO CAPS: 2.5000 mg | ORAL_CAPSULE | Freq: Two times a day (BID) | ORAL | 2 refills | Status: DC

## 2020-06-23 NOTE — Progress Notes (Signed)
Received a call from patient's daughter, [name] with several requests.  They would like to reschedule her December appointment. Message sent to scheduling.  The patient would like to try Marinol for appetite stimulation. Request sent to Dr Marin Olp. Reviewed with [name] that often this medication requires a PA and may take several days before the medication is ready for pick up. She understood.   Oncology Nurse Navigator Documentation  Oncology Nurse Navigator Flowsheets 06/23/2020  Abnormal Finding Date -  Planned Course of Treatment -  Phase of Treatment -  Navigator Follow Up Date: 07/02/2020  Navigator Follow Up Reason: Chemotherapy  Navigator Location [facility]  Referral Date to RadOnc/MedOnc -  Navigator Encounter Type Telephone  Telephone Incoming Call;Medication Assistance;Appt Confirmation/Clarification  Patient Visit Type MedOnc  Treatment Phase Pre-Tx/Tx Discussion  Barriers/Navigation Needs Coordination of Care;Education  Education Other  Interventions Coordination of Care;Referrals  Acuity Level 2-Minimal Needs (1-2 Barriers Identified)  Referrals -  Coordination of Care Appts  Education Method Verbal  Support Groups/Services Friends and Family  Time Spent with Patient 14

## 2020-06-23 NOTE — Telephone Encounter (Signed)
Called LMVM for patient regarding appointment date/time change per 10/27 sch msg

## 2020-06-25 ENCOUNTER — Inpatient Hospital Stay: Payer: Medicare Other

## 2020-06-25 ENCOUNTER — Other Ambulatory Visit: Payer: Self-pay

## 2020-06-25 VITALS — BP 173/89 | HR 70 | Temp 97.9°F | Resp 18

## 2020-06-25 DIAGNOSIS — C23 Malignant neoplasm of gallbladder: Secondary | ICD-10-CM | POA: Diagnosis not present

## 2020-06-25 DIAGNOSIS — K909 Intestinal malabsorption, unspecified: Secondary | ICD-10-CM

## 2020-06-25 DIAGNOSIS — D500 Iron deficiency anemia secondary to blood loss (chronic): Secondary | ICD-10-CM

## 2020-06-25 MED ORDER — SODIUM CHLORIDE 0.9 % IV SOLN
510.0000 mg | Freq: Once | INTRAVENOUS | Status: AC
  Administered 2020-06-25: 510 mg via INTRAVENOUS
  Filled 2020-06-25: qty 510

## 2020-06-25 MED ORDER — SODIUM CHLORIDE 0.9 % IV SOLN
Freq: Once | INTRAVENOUS | Status: AC
  Filled 2020-06-25: qty 250

## 2020-06-25 NOTE — Patient Instructions (Signed)

## 2020-06-25 NOTE — Progress Notes (Signed)
Pt discharged in no apparent distress. Pt left ambulatory without assistance. Pt aware of discharge instructions and verbalized understanding and had no further questions.  

## 2020-06-28 ENCOUNTER — Inpatient Hospital Stay: Payer: Medicare Other | Admitting: Family

## 2020-06-28 ENCOUNTER — Inpatient Hospital Stay: Payer: Medicare Other

## 2020-06-29 ENCOUNTER — Encounter: Payer: Self-pay | Admitting: *Deleted

## 2020-06-29 ENCOUNTER — Inpatient Hospital Stay: Payer: Medicare Other | Attending: Hematology & Oncology

## 2020-06-29 ENCOUNTER — Other Ambulatory Visit: Payer: Self-pay | Admitting: *Deleted

## 2020-06-29 ENCOUNTER — Other Ambulatory Visit: Payer: Self-pay

## 2020-06-29 ENCOUNTER — Other Ambulatory Visit: Payer: Self-pay | Admitting: Radiology

## 2020-06-29 DIAGNOSIS — Z5111 Encounter for antineoplastic chemotherapy: Secondary | ICD-10-CM | POA: Insufficient documentation

## 2020-06-29 DIAGNOSIS — C23 Malignant neoplasm of gallbladder: Secondary | ICD-10-CM

## 2020-06-29 DIAGNOSIS — D508 Other iron deficiency anemias: Secondary | ICD-10-CM | POA: Insufficient documentation

## 2020-06-29 DIAGNOSIS — C787 Secondary malignant neoplasm of liver and intrahepatic bile duct: Secondary | ICD-10-CM | POA: Insufficient documentation

## 2020-06-29 MED ORDER — DEXAMETHASONE 4 MG PO TABS: 8.0000 mg | ORAL_TABLET | Freq: Every day | ORAL | 1 refills | Status: DC

## 2020-06-29 MED ORDER — ONDANSETRON HCL 8 MG PO TABS: 8.0000 mg | ORAL_TABLET | Freq: Two times a day (BID) | ORAL | 1 refills | Status: DC | PRN

## 2020-06-29 MED ORDER — LIDOCAINE-PRILOCAINE 2.5-2.5 % EX CREA: TOPICAL_CREAM | CUTANEOUS | 3 refills | Status: DC

## 2020-06-29 MED ORDER — PROCHLORPERAZINE MALEATE 10 MG PO TABS: 10.0000 mg | ORAL_TABLET | Freq: Four times a day (QID) | ORAL | 1 refills | Status: DC | PRN

## 2020-06-29 NOTE — Progress Notes (Signed)
Patient in chemotherapy education class with  Daughter in law [place].  Discussed side effects of Carboplatin, Gemzar  which include but are not limited to myelosuppression, decreased appetite, fatigue, fever, allergic or infusional reaction, mucositis, cardiac toxicity, cough, SOB, altered taste, nausea and vomiting, diarrhea, constipation, elevated LFTs myalgia and arthralgias, hair loss or thinning, rash, skin dryness, nail changes, peripheral neuropathy, discolored urine, delayed wound healing, mental changes (Chemo brain), increased risk of infections, weight loss.  Reviewed infusion room and office policy and procedure and phone numbers 24 hours x 7 days a week.  Reviewed when to call the office with any concerns or problems.  Scientist, clinical (histocompatibility and immunogenetics) given.  Discussed portacath insertion and EMLA cream administration.  Antiemetic protocol and chemotherapy schedule reviewed. Patient verbalized understanding of chemotherapy indications and possible side effects.  Teachback done

## 2020-06-29 NOTE — H&P (Signed)
Chief Complaint: Patient was seen in consultation today for port-a-catheter placement  Referring Physician(s): Volanda Napoleon  Supervising Physician: Sandi Mariscal  Patient Status: Vanessa Garrett - Out-pt  History of Present Illness: Vanessa Garrett is a 79 y.o. 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 Her oncology team is preparing her for chemotherapy.  Interventional Radiology has been asked to evaluate this patient for an image-guided port-a-catheter placement to facilitate her treatment plans.   Past Medical History:  Diagnosis Date  . Arthritis   . Cancer ([place])   . Cataract   . Gallbladder cancer ([place]) 06/21/2020  . Goals of care, counseling/discussion 05/28/2020  . Hyperlipidemia   . Hypertension   . 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  . 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 Liver mass 05/28/2020  . Liver mass   . Patient is Jehovah's Witness 05/28/2020    Past Surgical History:  Procedure Laterality Date  . COLONOSCOPY    . D anc [facility]    . ESOPHAGOGASTRODUODENOSCOPY  03/[facility]  . UPPER GASTROINTESTINAL ENDOSCOPY      Allergies: Patient has no known allergies.  Medications: Prior to Admission medications   Medication Sig Start Date End Date Taking? Authorizing Provider  dronabinol (MARINOL) 2.5 MG capsule Take 1 capsule (2.5 mg total) by mouth 2 (two) times daily before a meal. 06/23/20   Ennever, Rudell Cobb, MD  Iron-Vitamins (S.S.S. TONIC PO) Take 45 mLs by mouth every other day.    [provider]  metoprolol tartrate (LOPRESSOR) 25 MG tablet Take 12.5 mg by mouth 2 (two) times daily.    [provider]  NON FORMULARY Take 2 tablets by mouth every other day. [employer] - Blood Builder    [provider]  NON FORMULARY 1.25 mg every other day. Beet Root Powder    [provider]  vitamin B-12  (CYANOCOBALAMIN) 1000 MCG tablet Take 1,000 mcg by mouth daily. 05/20/20   [provider]  VITAMIN D PO Take 2 tablets by mouth daily.    [provider]     Family History  Problem Relation Age of Onset  . Cancer Maternal Aunt        not sure the type  . Colon cancer Neg Hx   . Esophageal cancer Neg Hx   . Stomach cancer Neg Hx   . Rectal cancer Neg Hx     Social History   Socioeconomic History  . Marital status: Divorced    Spouse name: Not on file  . Number of children: Not on file  . Years of education: Not on file  . Highest education level: Not on file  Occupational History  . Not on file  Tobacco Use  . Smoking status: Former Smoker    Packs/day: 0.25    Years: 10.00    Pack years: 2.50    Types: Cigarettes    Quit date: 08/26/1969    Years since quitting: 50.8  . Smokeless tobacco: Never Used  Vaping Use  . Vaping Use: Never used  Substance and Sexual Activity  . Alcohol use: Not Currently  . Drug use: Never  . Sexual activity: Not on file  Other Topics Concern  . Not on file  Social History Narrative  . Not on file   Social Determinants of Health   Financial Resource Strain:   . Difficulty of Paying  Living Expenses: Not on file  Food Insecurity:   . Worried About Charity fundraiser in the Last Year: Not on file  . Ran Out of Food in the Last Year: Not on file  Transportation Needs:   . Lack of Transportation (Medical): Not on file  . Lack of Transportation (Non-Medical): Not on file  Physical Activity:   . Days of Exercise per Week: Not on file  . Minutes of Exercise per Session: Not on file  Stress:   . Feeling of Stress : Not on file  Social Connections:   . Frequency of Communication with Friends and Family: Not on file  . Frequency of Social Gatherings with Friends and Family: Not on file  . Attends Religious Services: Not on file  . Active Member of Clubs or Organizations: Not on file  . Attends Archivist  Meetings: Not on file  . Marital Status: Not on file    Review of Systems: A 12 point ROS discussed and pertinent positives are indicated in the HPI above.  All other systems are negative.  Review of Systems  Constitutional: Negative for appetite change and fatigue.  Respiratory: Negative for cough and shortness of breath.   Cardiovascular: Positive for leg swelling. Negative for chest pain.       Chronic.   Gastrointestinal: Negative for abdominal pain, diarrhea, nausea and vomiting.  Genitourinary: Positive for flank pain.       Intermittent pain  Neurological: Negative for dizziness and headaches.    Vital Signs: BP (!) 141/78   Pulse 74   Temp 97.8 F (36.6 C) (Oral)   Resp 18   SpO2 99%   Physical Exam Constitutional:      General: She is not in acute distress. HENT:     Mouth/Throat:     Mouth: Mucous membranes are moist.     Pharynx: Oropharynx is clear.  Cardiovascular:     Rate and Rhythm: Normal rate and regular rhythm.     Pulses: Normal pulses.     Heart sounds: Normal heart sounds.  Pulmonary:     Effort: Pulmonary effort is normal.     Breath sounds: Normal breath sounds.  Abdominal:     General: Bowel sounds are normal.     Palpations: Abdomen is soft.  Musculoskeletal:        General: Normal range of motion.     Right lower leg: Edema present.     Left lower leg: Edema present.     Comments: Compression stockings in place  Skin:    General: Skin is warm and dry.  Neurological:     Mental Status: She is alert and oriented to person, place, and time.     Imaging: CT Chest W Contrast  Result Date: 06/13/2020 CLINICAL DATA:  Liver mass, for staging EXAM: CT CHEST WITH CONTRAST TECHNIQUE: Multidetector CT imaging of the chest was performed during intravenous contrast administration. CONTRAST:  38mL OMNIPAQUE IOHEXOL 300 MG/ML  SOLN COMPARISON:  None. FINDINGS: Cardiovascular: The heart is normal in size. No pericardial effusion. No evidence of  thoracic aortic aneurysm. Atherosclerotic calcifications of the aortic arch. Mild three-vessel coronary atherosclerosis. Mediastinum/Nodes: Small mediastinal lymph nodes which do not meet pathologic CT size criteria. Visualized thyroid is unremarkable. Lungs/Pleura: No suspicious pulmonary nodules. No focal consolidation. Mild compressive atelectasis in the medial right lower lobe. No pleural effusion or pneumothorax. Upper Abdomen: Visualized upper abdomen is notable for multifocal hepatic masses measuring up to 6.9 cm in segment  5 (series 2/image 117). Intrahepatic ductal dilatation within the central left hepatic lobe (series 2/image 137). These findings are better evaluated on concurrent MRI abdomen. Musculoskeletal: Degenerative changes of the visualized thoracolumbar spine. IMPRESSION: No evidence of metastatic disease in the chest. Multifocal hepatic masses, better evaluated on concurrent MRI abdomen. Aortic Atherosclerosis (ICD10-I70.0). Electronically Signed   By: Julian Hy M.D.   On: 06/13/2020 04:07   MR LIVER W WO CONTRAST  Result Date: 06/13/2020 CLINICAL DATA:  Rectal bleeding. Liver masses, favoring primary hepatobiliary malignancy on outside report. EXAM: MRI ABDOMEN WITHOUT AND WITH CONTRAST TECHNIQUE: Multiplanar multisequence MR imaging of the abdomen was performed both before and after the administration of intravenous contrast. CONTRAST:  68mL GADAVIST GADOBUTROL 1 MMOL/ML IV SOLN COMPARISON:  None. FINDINGS: Motion degraded imaging, particularly on the dynamic postcontrast imaging, which constraints evaluation. Lower chest: Visualized lungs are clear. Hepatobiliary: Irregular soft tissue/enhancing mass in the expected location of the gallbladder, measuring 8.0 x 6.1 x 7.6 cm (series 24/image 11), suspicious for gallbladder adenocarcinoma. If the patient is status post cholecystectomy, a central cholangiocarcinoma extruding into the gallbladder fossa would be the other diagnostic  consideration. Mild central intrahepatic ductal dilatation in the left hepatic lobe (series 3/image 18). Three enhancing masses in the liver, compatible with metastatic disease, as follows: --3.1 x 3.7 cm lesion in segment 7 (series 3/image 5) --6.9 x 6.7 cm lesion in segment 5/6 (series 3/image 12) --5.1 x 4.1 cm lesion in segment 4B (series 3/image 15) No morphologic findings of cirrhosis. Signal loss on inphase imaging, raising concern for iron deposition/hemosiderosis. Pancreas:  Within normal limits. Spleen:  Within normal limits. Adrenals/Urinary Tract:  Adrenal glands are within normal limits. Kidneys are within normal limits.  No hydronephrosis. Stomach/Bowel: Stomach and visualized bowel are grossly unremarkable. Vascular/Lymphatic:  No evidence of abdominal aortic aneurysm. Upper abdominal nodes measuring up to 11 mm short axis (series 3/images 15, 19, and 23). 9 mm short axis left para-aortic node (series 3/image 22). Other:  No abdominal ascites. Musculoskeletal: No focal osseous lesions. IMPRESSION: Motion degraded images. 8.0 cm mass in the expected location of the gallbladder, suspicious for gallbladder adenocarcinoma. If the patient is status post cholecystectomy, a central cholangiocarcinoma extruding into the gallbladder fossa would be the other diagnostic consideration. Mild central intrahepatic ductal dilatation in the left hepatic lobe. Three hepatic metastases measuring up to 6.9 cm, as above. Suspected upper abdominal/retroperitoneal nodal metastases measuring up to 11 mm short axis. Electronically Signed   By: Julian Hy M.D.   On: 06/13/2020 05:19    Labs:  CBC: Recent Labs    05/28/20 1528 06/21/20 1353  WBC 5.2 6.0  HGB 9.3* 10.7*  HCT 31.7* 36.8  PLT 335 333    COAGS: No results for input(s): INR, APTT in the last 8760 hours.  BMP: Recent Labs    05/28/20 1528 06/21/20 1353  NA 138 138  K 3.3* 3.6  CL 100 99  CO2 31 32  GLUCOSE 111* 133*  BUN 14 10    CALCIUM 9.3 9.4  CREATININE 0.72 0.72  GFRNONAA >60 >60  GFRAA >60  --     LIVER FUNCTION TESTS: Recent Labs    05/28/20 1528 06/21/20 1353  BILITOT 0.2* 0.3  AST 15 14*  ALT 6 5  ALKPHOS 109 104  PROT 7.0 7.3  ALBUMIN 3.7 3.6    TUMOR MARKERS: No results for input(s): AFPTM, CEA, CA199, CHROMGRNA in the last 8760 hours.  Assessment and Plan:  Stage IV  adenocarcinoma of the gallbladder with liver and lymph node metastases: Vanessa Garrett, 79 year old female, presents today to the [facility] department for an image-guided port-a-catheter placement.   Risks and benefits of an image-guided port-a-catheter placement were discussed with the patient including, but not limited to bleeding, infection, pneumothorax, or fibrin sheath development and need for additional procedures.  All of the patient's questions were answered, patient is agreeable to proceed.  She has been NPO. Vitals have been reviewed.   Consent signed and in chart.  Thank you for this interesting consult.  I greatly enjoyed meeting Vanessa Garrett and look forward to participating in their care.  A copy of this report was sent to the requesting provider on this date.  Electronically Signed: Soyla Dryer, AGACNP-BC [phone] 06/30/2020, 12:37 PM   I spent a total of  30 Minutes   in face to face in clinical consultation, greater than 50% of which was counseling/coordinating care for port-a-catheter placement.

## 2020-06-30 ENCOUNTER — Other Ambulatory Visit: Payer: Self-pay | Admitting: Hematology & Oncology

## 2020-06-30 ENCOUNTER — Ambulatory Visit (HOSPITAL_COMMUNITY)
Admission: RE | Admit: 2020-06-30 | Discharge: 2020-06-30 | Disposition: A | Discharge status: Home / Self Care | Payer: Medicare Other | Source: Ambulatory Visit | Attending: Hematology & Oncology | Admitting: Hematology & Oncology

## 2020-06-30 ENCOUNTER — Encounter (HOSPITAL_COMMUNITY): Payer: Self-pay

## 2020-06-30 DIAGNOSIS — C787 Secondary malignant neoplasm of liver and intrahepatic bile duct: Secondary | ICD-10-CM | POA: Insufficient documentation

## 2020-06-30 DIAGNOSIS — C779 Secondary and unspecified malignant neoplasm of lymph node, unspecified: Secondary | ICD-10-CM | POA: Diagnosis not present

## 2020-06-30 DIAGNOSIS — C23 Malignant neoplasm of gallbladder: Secondary | ICD-10-CM

## 2020-06-30 DIAGNOSIS — Z87891 Personal history of nicotine dependence: Secondary | ICD-10-CM | POA: Insufficient documentation

## 2020-06-30 HISTORY — PX: IR IMAGING GUIDED PORT INSERTION: IMG5740

## 2020-06-30 LAB — NO BLOOD PRODUCTS

## 2020-06-30 IMAGING — US IR IMAGING GUIDED PORT INSERTION
2 series · 2 of 2 positions shown · non-contrast
Comparison: Chest CT-[DATE]

INDICATION: History of metastatic of answer cancer. In need of durable
intravenous access for chemotherapy administration

EXAM:
IMPLANTED PORT A CATH PLACEMENT WITH ULTRASOUND AND FLUOROSCOPIC
GUIDANCE

[Series 1: (id) · 1 of 1 slices shown]
[im 1/1]
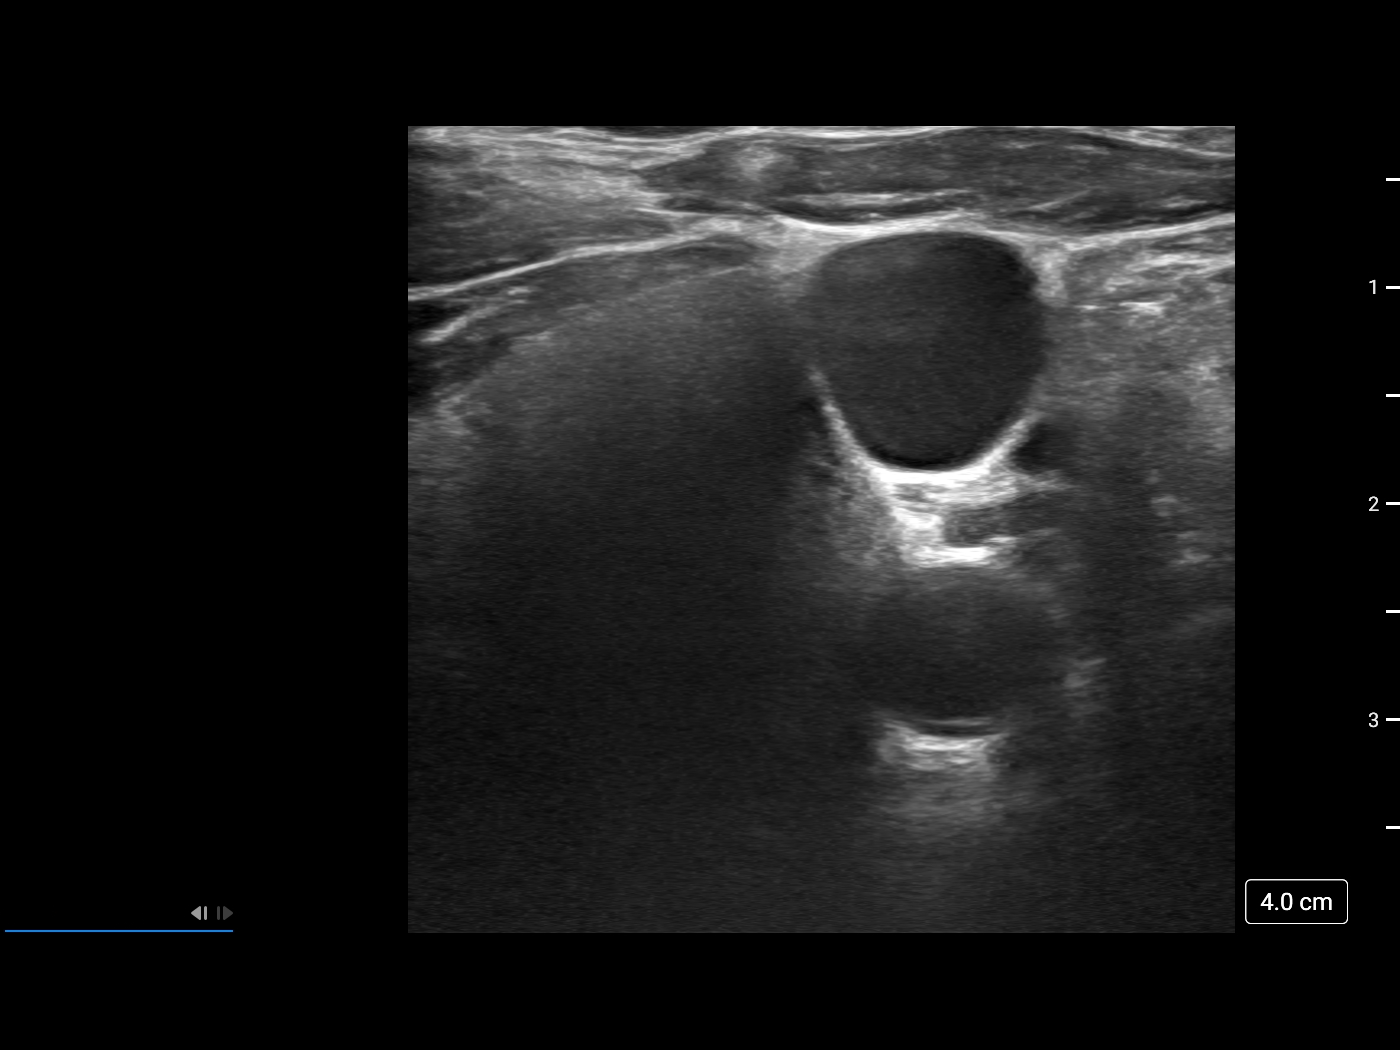

[Series 300: line placements · 1 of 1 slices shown]
[im 1/1]
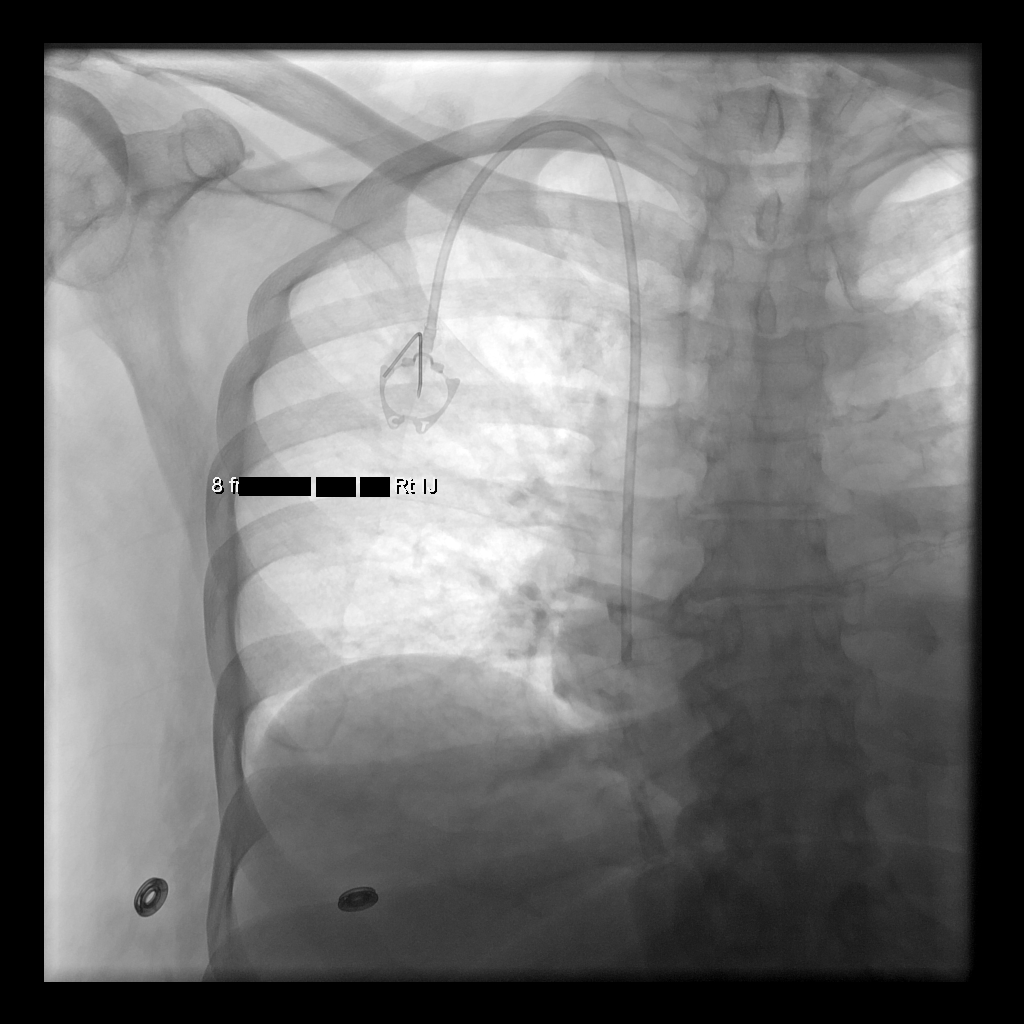

[2 of 2 positions shown; findings below may reference images not displayed]

MEDICATIONS:
Ancef 2 gm IV; The antibiotic was administered within an appropriate
time interval prior to skin puncture.

ANESTHESIA/SEDATION:
Moderate (conscious) sedation was employed during this procedure. A
total of Versed 2 mg and Fentanyl 100 mcg was administered
intravenously.

Moderate Sedation Time: 23 minutes. The patient's level of
consciousness and vital signs were monitored continuously by
radiology nursing throughout the procedure under my direct
supervision.

CONTRAST:  None

FLUOROSCOPY TIME:  48 seconds

COMPLICATIONS:
None immediate.

PROCEDURE:
The procedure, risks, benefits, and alternatives were explained to
the patient. Questions regarding the procedure were encouraged and
answered. The patient understands and consents to the procedure.

The right neck and chest were prepped with chlorhexidine in a
sterile fashion, and a sterile drape was applied covering the
operative field. Maximum barrier sterile technique with sterile
gowns and gloves were used for the procedure. A timeout was
performed prior to the initiation of the procedure. Local anesthesia
was provided with 1% lidocaine with epinephrine.

After creating a small venotomy incision, a micropuncture kit was
utilized to access the internal jugular vein. Real-time ultrasound
guidance was utilized for vascular access including the acquisition
of a permanent ultrasound image documenting patency of the accessed
vessel. The microwire was utilized to measure appropriate catheter
length.

A subcutaneous port pocket was then created along the upper chest
wall utilizing a combination of sharp and blunt dissection. The
pocket was irrigated with sterile saline. A single lumen "Slim"
sized power injectable port was chosen for placement. The 8 Fr
catheter was tunneled from the port pocket site to the venotomy
incision. The port was placed in the pocket. The external catheter
was trimmed to appropriate length. At the venotomy, an 8 Fr
peel-away sheath was placed over a guidewire under fluoroscopic
guidance. The catheter was then placed through the sheath and the
sheath was removed. Final catheter positioning was confirmed and
documented with a fluoroscopic spot radiograph. The port was
accessed with GEORGE needle, aspirated and flushed with heparinized
saline.

The venotomy site was closed with an interrupted 4-0 Vicryl suture.
The port pocket incision was closed with interrupted 2-0 Vicryl
suture. The skin was opposed with a running subcuticular 4-0 Vicryl
suture. Dermabond and GEORGE were applied to both incisions.
Dressings were applied. The patient tolerated the procedure well
without immediate post procedural complication.
FINDINGS: After catheter placement, the tip lies within the superior
cavoatrial junction. The catheter aspirates and flushes normally and
is ready for immediate use.
IMPRESSION: Successful placement of a right internal jugular approach power
injectable Port-A-Cath. The catheter is ready for immediate use.

## 2020-06-30 MED ORDER — FENTANYL CITRATE (PF) 100 MCG/2ML IJ SOLN
INTRAMUSCULAR | Status: AC | PRN
Start: 2020-06-30 — End: 2020-06-30
  Administered 2020-06-30 (×2): 50 ug via INTRAVENOUS

## 2020-06-30 MED ORDER — LIDOCAINE-EPINEPHRINE 1 %-1:100000 IJ SOLN
INTRAMUSCULAR | Status: AC | PRN
  Administered 2020-06-30: 10 mL

## 2020-06-30 MED ORDER — CEFAZOLIN SODIUM-DEXTROSE 2-4 GM/100ML-% IV SOLN: 2.0000 g | Freq: Once | INTRAVENOUS | Status: AC

## 2020-06-30 MED ORDER — CEFAZOLIN SODIUM-DEXTROSE 2-4 GM/100ML-% IV SOLN
INTRAVENOUS | Status: AC
  Administered 2020-06-30: 2 g via INTRAVENOUS
  Filled 2020-06-30: qty 100

## 2020-06-30 MED ORDER — FENTANYL CITRATE (PF) 100 MCG/2ML IJ SOLN
INTRAMUSCULAR | Status: AC
  Filled 2020-06-30: qty 2

## 2020-06-30 MED ORDER — MIDAZOLAM HCL 2 MG/2ML IJ SOLN
INTRAMUSCULAR | Status: AC
  Filled 2020-06-30: qty 4

## 2020-06-30 MED ORDER — HEPARIN SOD (PORK) LOCK FLUSH 100 UNIT/ML IV SOLN
INTRAVENOUS | Status: AC
  Filled 2020-06-30: qty 5

## 2020-06-30 MED ORDER — MIDAZOLAM HCL 2 MG/2ML IJ SOLN
INTRAMUSCULAR | Status: AC | PRN
  Administered 2020-06-30 (×2): 1 mg via INTRAVENOUS

## 2020-06-30 MED ORDER — SODIUM CHLORIDE 0.9 % IV SOLN: INTRAVENOUS | Status: DC

## 2020-06-30 MED ORDER — LIDOCAINE-EPINEPHRINE 1 %-1:100000 IJ SOLN
INTRAMUSCULAR | Status: AC
  Filled 2020-06-30: qty 1

## 2020-06-30 NOTE — Procedures (Signed)
Pre Procedure Dx: Poor venous access Post Procedural Dx: Same  Successful placement of right IJ approach port-a-cath with tip at the superior caval atrial junction. The catheter is ready for immediate use.  Estimated Blood Loss: Minimal  Complications: None immediate.  Jay Toyna Erisman, MD Pager #: [phone]   

## 2020-06-30 NOTE — Discharge Instructions (Signed)
Do not use lidocaine cream over your new port until it has healed. The petroleum in the lidocaine cream will dissolve the skin glue holding the incision together.    Implanted Port Insertion, Care After This sheet gives you information about how to care for yourself after your procedure. Your health care provider may also give you more specific instructions. If you have problems or questions, contact your health care provider. What can I expect after the procedure? After the procedure, it is common to have:  Discomfort at the port insertion site.  Bruising on the skin over the port. This should improve over 3-4 days. Follow these instructions at home: [facility] care  After your port is placed, you will get a manufacturer's information card. The card has information about your port. Keep this card with you at all times.  Take care of the port as told by your health care provider. Ask your health care provider if you or a family member can get training for taking care of the port at home. A home health care nurse may also take care of the port.  Make sure to remember what type of port you have. Incision care      Follow instructions from your health care provider about how to take care of your port insertion site. Make sure you: ? Wash your hands with soap and water before and after you change your bandage (dressing). If soap and water are not available, use hand sanitizer. ? Change your dressing as told by your health care provider. Remove gauze and transparent dressing over your new port at 3 PM 07/01/20.  Leave  skin glue in place. These skin closures may need to stay in place for 2 weeks or longer.  Check your port insertion site every day for signs of infection. Check for: ? Redness, swelling, or pain. ? Fluid or blood. ? Warmth. ? Pus or a bad smell. Activity  Return to your normal activities as told by your health care provider. Ask your health care provider what activities are  safe for you.  Do not lift anything that is heavier than 10 lb (4.5 kg), or the limit that you are told, until your health care provider says that it is safe. General instructions  Take over-the-counter and prescription medicines only as told by your health care provider.  Do not take baths, swim, or use a hot tub until your health care provider approves. You may remove the gauze and clear dressing over your incision tomorrow around 3 PM and shower.  Do not drive for 24 hours if you were given a sedative during your procedure.  Wear a medical alert bracelet in case of an emergency. This will tell any health care providers that you have a port.  Keep all follow-up visits as told by your health care provider. This is important. Contact a health care provider if:  You cannot flush your port with saline as directed, or you cannot draw blood from the port.  You have a fever or chills.  You have redness, swelling, or pain around your port insertion site.  You have fluid or blood coming from your port insertion site.  Your port insertion site feels warm to the touch.  You have pus or a bad smell coming from the port insertion site. Get help right away if:  You have chest pain or shortness of breath.  You have bleeding from your port that you cannot control. Summary  Take care of the  port as told by your health care provider. Keep the manufacturer's information card with you at all times.  Change your dressing as told by your health care provider.  Contact a health care provider if you have a fever or chills or if you have redness, swelling, or pain around your port insertion site.  Keep all follow-up visits as told by your health care provider. This information is not intended to replace advice given to you by your health care provider. Make sure you discuss any questions you have with your health care provider. Document Revised: 03/12/2018 Document Reviewed: 03/12/2018 Elsevier  Patient Education  [place].     Moderate Conscious Sedation, Adult, Care After These instructions provide you with information about caring for yourself after your procedure. Your health care provider may also give you more specific instructions. Your treatment has been planned according to current medical practices, but problems sometimes occur. Call your health care provider if you have any problems or questions after your procedure. What can I expect after the procedure? After your procedure, it is common:  To feel sleepy for several hours.  To feel clumsy and have poor balance for several hours.  To have poor judgment for several hours.  To vomit if you eat too soon. Follow these instructions at home: For at least 24 hours after the procedure:   Do not: ? Participate in activities where you could fall or become injured. ? Drive. ? Use heavy machinery. ? Drink alcohol. ? Take sleeping pills or medicines that cause drowsiness. ? Make important decisions or sign legal documents. ? Take care of children on your own.  Rest. Eating and drinking  Follow the diet recommended by your health care provider.  If you vomit: ? Drink water, juice, or soup when you can drink without vomiting. ? Make sure you have little or no nausea before eating solid foods. General instructions  Have a responsible adult stay with you until you are awake and alert.  Take over-the-counter and prescription medicines only as told by your health care provider.  If you smoke, do not smoke without supervision.  Keep all follow-up visits as told by your health care provider. This is important. Contact a health care provider if:  You keep feeling nauseous or you keep vomiting.  You feel light-headed.  You develop a rash.  You have a fever. Get help right away if:  You have trouble breathing. This information is not intended to replace advice given to you by your health care  provider. Make sure you discuss any questions you have with your health care provider. Document Revised: 07/27/2017 Document Reviewed: 12/04/2015 Elsevier Patient Education  2020 [employer].

## 2020-07-02 ENCOUNTER — Inpatient Hospital Stay: Payer: Medicare Other

## 2020-07-02 ENCOUNTER — Encounter: Payer: Self-pay | Admitting: *Deleted

## 2020-07-02 ENCOUNTER — Other Ambulatory Visit: Payer: Self-pay

## 2020-07-02 VITALS — BP 162/72 | HR 70

## 2020-07-02 DIAGNOSIS — C23 Malignant neoplasm of gallbladder: Secondary | ICD-10-CM | POA: Diagnosis present

## 2020-07-02 DIAGNOSIS — D500 Iron deficiency anemia secondary to blood loss (chronic): Secondary | ICD-10-CM

## 2020-07-02 DIAGNOSIS — Z5111 Encounter for antineoplastic chemotherapy: Secondary | ICD-10-CM | POA: Diagnosis present

## 2020-07-02 DIAGNOSIS — C787 Secondary malignant neoplasm of liver and intrahepatic bile duct: Secondary | ICD-10-CM | POA: Diagnosis present

## 2020-07-02 DIAGNOSIS — R160 Hepatomegaly, not elsewhere classified: Secondary | ICD-10-CM

## 2020-07-02 DIAGNOSIS — D508 Other iron deficiency anemias: Secondary | ICD-10-CM | POA: Diagnosis not present

## 2020-07-02 DIAGNOSIS — K909 Intestinal malabsorption, unspecified: Secondary | ICD-10-CM

## 2020-07-02 LAB — CBC WITH DIFFERENTIAL (CANCER CENTER ONLY)
Abs Immature Granulocytes: 0.01 10*3/uL (ref 0.00–0.07)
Basophils Absolute: 0 10*3/uL (ref 0.0–0.1)
Basophils Relative: 1 %
Eosinophils Absolute: 0 10*3/uL (ref 0.0–0.5)
Eosinophils Relative: 0 %
HCT: 35.7 % — ABNORMAL LOW (ref 36.0–46.0)
Hemoglobin: 10.7 g/dL — ABNORMAL LOW (ref 12.0–15.0)
Immature Granulocytes: 0 %
Lymphocytes Relative: 14 %
Lymphs Abs: 0.8 10*3/uL (ref 0.7–4.0)
MCH: 24.6 pg — ABNORMAL LOW (ref 26.0–34.0)
MCHC: 30 g/dL (ref 30.0–36.0)
MCV: 82.1 fL (ref 80.0–100.0)
Monocytes Absolute: 0.5 10*3/uL (ref 0.1–1.0)
Monocytes Relative: 8 %
Neutro Abs: 4.2 10*3/uL (ref 1.7–7.7)
Neutrophils Relative %: 77 %
Platelet Count: 275 10*3/uL (ref 150–400)
RBC: 4.35 MIL/uL (ref 3.87–5.11)
RDW: 18.3 % — ABNORMAL HIGH (ref 11.5–15.5)
WBC Count: 5.5 10*3/uL (ref 4.0–10.5)
nRBC: 0 % (ref 0.0–0.2)

## 2020-07-02 LAB — BASIC METABOLIC PANEL - CANCER CENTER ONLY
Anion gap: 7 (ref 5–15)
BUN: 9 mg/dL (ref 8–23)
CO2: 32 mmol/L (ref 22–32)
Calcium: 9.5 mg/dL (ref 8.9–10.3)
Chloride: 101 mmol/L (ref 98–111)
Creatinine: 0.72 mg/dL (ref 0.44–1.00)
GFR, Estimated: >60 mL/min (ref >60)
Glucose, Bld: 162 mg/dL — ABNORMAL HIGH (ref 70–99)
Potassium: 3.1 mmol/L — ABNORMAL LOW (ref 3.5–5.1)
Sodium: 140 mmol/L (ref 135–145)

## 2020-07-02 MED ORDER — SODIUM CHLORIDE 0.9 % IV SOLN
Freq: Once | INTRAVENOUS | Status: AC
  Filled 2020-07-02: qty 250

## 2020-07-02 MED ORDER — HEPARIN SOD (PORK) LOCK FLUSH 100 UNIT/ML IV SOLN
500.0000 [IU] | Freq: Once | INTRAVENOUS | Status: AC | PRN
  Administered 2020-07-02: 500 [IU]
  Filled 2020-07-02: qty 5

## 2020-07-02 MED ORDER — SODIUM CHLORIDE 0.9% FLUSH
10.0000 mL | INTRAVENOUS | Status: DC | PRN
  Administered 2020-07-02: 10 mL
  Filled 2020-07-02: qty 10

## 2020-07-02 MED ORDER — SODIUM CHLORIDE 0.9 % IV SOLN
150.0000 mg | Freq: Once | INTRAVENOUS | Status: AC
  Administered 2020-07-02: 150 mg via INTRAVENOUS
  Filled 2020-07-02: qty 150

## 2020-07-02 MED ORDER — SODIUM CHLORIDE 0.9 % IV SOLN
319.6000 mg | Freq: Once | INTRAVENOUS | Status: AC
  Administered 2020-07-02: 320 mg via INTRAVENOUS
  Filled 2020-07-02: qty 32

## 2020-07-02 MED ORDER — SODIUM CHLORIDE 0.9 % IV SOLN
10.0000 mg | Freq: Once | INTRAVENOUS | Status: AC
  Administered 2020-07-02: 10 mg via INTRAVENOUS
  Filled 2020-07-02: qty 10

## 2020-07-02 MED ORDER — SODIUM CHLORIDE 0.9 % IV SOLN
510.0000 mg | Freq: Once | INTRAVENOUS | Status: AC
  Administered 2020-07-02: 510 mg via INTRAVENOUS
  Filled 2020-07-02: qty 510

## 2020-07-02 MED ORDER — PALONOSETRON HCL INJECTION 0.25 MG/5ML
INTRAVENOUS | Status: AC
  Filled 2020-07-02: qty 5

## 2020-07-02 MED ORDER — PALONOSETRON HCL INJECTION 0.25 MG/5ML
0.2500 mg | Freq: Once | INTRAVENOUS | Status: AC
  Administered 2020-07-02: 0.25 mg via INTRAVENOUS

## 2020-07-02 MED ORDER — SODIUM CHLORIDE 0.9 % IV SOLN
800.0000 mg/m2 | Freq: Once | INTRAVENOUS | Status: AC
  Administered 2020-07-02: 1520 mg via INTRAVENOUS
  Filled 2020-07-02: qty 26.3

## 2020-07-02 NOTE — Patient Instructions (Addendum)
[facility] Discharge Instructions for Patients Receiving Chemotherapy  Today you received the following chemotherapy agents Gemcitabine and Carboplatin  To help prevent nausea and vomiting after your treatment, we encourage you to take your nausea medication  as prescribed by MD  1) Beginning tomorrow 07/03/2020 Begin taking your Dexamethasone (Decadron) 2 tablets by mouth for 3 days  2)  At any time you can take Prochlorperazine (Compazine) by mouth every 6 hours.    If you develop nausea and vomiting that is not controlled by your nausea medication, call the clinic.   BELOW ARE SYMPTOMS THAT SHOULD BE REPORTED IMMEDIATELY:  *FEVER GREATER THAN 100.5 F  *CHILLS WITH OR WITHOUT FEVER  NAUSEA AND VOMITING THAT IS NOT CONTROLLED WITH YOUR NAUSEA MEDICATION  *UNUSUAL SHORTNESS OF BREATH  *UNUSUAL BRUISING OR BLEEDING  TENDERNESS IN MOUTH AND THROAT WITH OR WITHOUT PRESENCE OF ULCERS  *URINARY PROBLEMS  *BOWEL PROBLEMS  UNUSUAL RASH Items with * indicate a potential emergency and should be followed up as soon as possible.  Feel free to call the clinic should you have any questions or concerns. The clinic phone number is [phone].  Please show the [place] at check-in to the Emergency Department and triage nurse.

## 2020-07-02 NOTE — Progress Notes (Signed)
Pt discharged in no apparent distress. Pt left ambulatory without assistance. Pt aware of discharge instructions and verbalized understanding and had no further questions.  

## 2020-07-02 NOTE — Progress Notes (Signed)
Oncology Nurse Navigator Documentation  Oncology Nurse Navigator Flowsheets 07/02/2020  Abnormal Finding Date -  Planned Course of Treatment -  Phase of Treatment Chemo  Chemotherapy Actual Start Date: 07/02/2020  Navigator Follow Up Date: 07/28/2020  Navigator Follow Up Reason: Follow-up Appointment;Chemotherapy  Navigator Location [facility]  Referral Date to RadOnc/MedOnc -  Navigator Encounter Type Treatment  Telephone -  Treatment Initiated Date 07/02/2020  Patient Visit Type MedOnc  Treatment Phase First Chemo Tx  Barriers/Navigation Needs Coordination of Care;Education  Education -  Interventions Psycho-Social Support  Acuity Level 2-Minimal Needs (1-2 Barriers Identified)  Referrals -  Coordination of Care -  Education Method -  Support Groups/Services Friends and Family  Time Spent with Patient 30

## 2020-07-02 NOTE — Patient Instructions (Signed)

## 2020-07-05 ENCOUNTER — Telehealth: Payer: Self-pay | Admitting: *Deleted

## 2020-07-05 NOTE — Telephone Encounter (Signed)
Message left on Onc Navigator's voicemail by patient's daughter requesting a call back.  Call placed back to patient's daughter, Vanessa Garrett and pt.'s daughter states that pt is having no difficulties at this time, but does have questions regarding anti-emetics and EMLA cream.  Zofran, Compazine, Decadron and EMLA directions reviewed with pt.'s daughter.  Teach back done.  Pt.'s daughter is appreciative of assistance and has no further questions at this time.

## 2020-07-09 ENCOUNTER — Other Ambulatory Visit: Payer: Self-pay

## 2020-07-09 ENCOUNTER — Inpatient Hospital Stay: Payer: Medicare Other

## 2020-07-09 DIAGNOSIS — R160 Hepatomegaly, not elsewhere classified: Secondary | ICD-10-CM

## 2020-07-09 DIAGNOSIS — Z5111 Encounter for antineoplastic chemotherapy: Secondary | ICD-10-CM | POA: Diagnosis not present

## 2020-07-09 DIAGNOSIS — C23 Malignant neoplasm of gallbladder: Secondary | ICD-10-CM

## 2020-07-09 LAB — BASIC METABOLIC PANEL - CANCER CENTER ONLY
Anion gap: 9 (ref 5–15)
BUN: 14 mg/dL (ref 8–23)
CO2: 31 mmol/L (ref 22–32)
Calcium: 9 mg/dL (ref 8.9–10.3)
Chloride: 97 mmol/L — ABNORMAL LOW (ref 98–111)
Creatinine: 0.67 mg/dL (ref 0.44–1.00)
GFR, Estimated: >60 mL/min (ref >60)
Glucose, Bld: 188 mg/dL — ABNORMAL HIGH (ref 70–99)
Potassium: 3.1 mmol/L — ABNORMAL LOW (ref 3.5–5.1)
Sodium: 137 mmol/L (ref 135–145)

## 2020-07-09 LAB — CBC WITH DIFFERENTIAL (CANCER CENTER ONLY)
Abs Immature Granulocytes: 0.02 10*3/uL (ref 0.00–0.07)
Basophils Absolute: 0 10*3/uL (ref 0.0–0.1)
Basophils Relative: 0 %
Eosinophils Absolute: 0 10*3/uL (ref 0.0–0.5)
Eosinophils Relative: 1 %
HCT: 33.2 % — ABNORMAL LOW (ref 36.0–46.0)
Hemoglobin: 10.3 g/dL — ABNORMAL LOW (ref 12.0–15.0)
Immature Granulocytes: 1 %
Lymphocytes Relative: 19 %
Lymphs Abs: 0.8 10*3/uL (ref 0.7–4.0)
MCH: 24.9 pg — ABNORMAL LOW (ref 26.0–34.0)
MCHC: 31 g/dL (ref 30.0–36.0)
MCV: 80.2 fL (ref 80.0–100.0)
Monocytes Absolute: 0.1 10*3/uL (ref 0.1–1.0)
Monocytes Relative: 2 %
Neutro Abs: 3.2 10*3/uL (ref 1.7–7.7)
Neutrophils Relative %: 77 %
Platelet Count: 191 10*3/uL (ref 150–400)
RBC: 4.14 MIL/uL (ref 3.87–5.11)
RDW: 18.2 % — ABNORMAL HIGH (ref 11.5–15.5)
WBC Count: 4.1 10*3/uL (ref 4.0–10.5)
nRBC: 0 % (ref 0.0–0.2)

## 2020-07-09 MED ORDER — PROCHLORPERAZINE MALEATE 10 MG PO TABS
10.0000 mg | ORAL_TABLET | Freq: Once | ORAL | Status: AC
  Administered 2020-07-09: 10 mg via ORAL

## 2020-07-09 MED ORDER — SODIUM CHLORIDE 0.9 % IV SOLN
Freq: Once | INTRAVENOUS | Status: AC
  Filled 2020-07-09: qty 250

## 2020-07-09 MED ORDER — SODIUM CHLORIDE 0.9 % IV SOLN
800.0000 mg/m2 | Freq: Once | INTRAVENOUS | Status: AC
  Administered 2020-07-09: 1520 mg via INTRAVENOUS
  Filled 2020-07-09: qty 26.3

## 2020-07-09 MED ORDER — PROCHLORPERAZINE MALEATE 10 MG PO TABS
ORAL_TABLET | ORAL | Status: AC
  Filled 2020-07-09: qty 1

## 2020-07-09 MED ORDER — SODIUM CHLORIDE 0.9% FLUSH
10.0000 mL | INTRAVENOUS | Status: DC | PRN
  Administered 2020-07-09: 10 mL
  Filled 2020-07-09: qty 10

## 2020-07-09 MED ORDER — HEPARIN SOD (PORK) LOCK FLUSH 100 UNIT/ML IV SOLN
500.0000 [IU] | Freq: Once | INTRAVENOUS | Status: AC | PRN
  Administered 2020-07-09: 500 [IU]
  Filled 2020-07-09: qty 5

## 2020-07-09 NOTE — Patient Instructions (Signed)
[facility] Discharge Instructions for Patients Receiving Chemotherapy  Today you received the following chemotherapy agents Gemzar To help prevent nausea and vomiting after your treatment, we encourage you to take your nausea medication as prescribed.  If you develop nausea and vomiting that is not controlled by your nausea medication, call the clinic.   BELOW ARE SYMPTOMS THAT SHOULD BE REPORTED IMMEDIATELY:  *FEVER GREATER THAN 100.5 F  *CHILLS WITH OR WITHOUT FEVER  NAUSEA AND VOMITING THAT IS NOT CONTROLLED WITH YOUR NAUSEA MEDICATION  *UNUSUAL SHORTNESS OF BREATH  *UNUSUAL BRUISING OR BLEEDING  TENDERNESS IN MOUTH AND THROAT WITH OR WITHOUT PRESENCE OF ULCERS  *URINARY PROBLEMS  *BOWEL PROBLEMS  UNUSUAL RASH Items with * indicate a potential emergency and should be followed up as soon as possible.  Feel free to call the clinic should you have any questions or concerns. The clinic phone number is [phone].  Please show the CHEMO ALERT CARD at check-in to the Emergency Department and triage nurse.   

## 2020-07-09 NOTE — Patient Instructions (Signed)

## 2020-07-09 NOTE — Progress Notes (Signed)
Pt discharged in no apparent distress. Pt left ambulatory without assistance. Pt aware of discharge instructions and verbalized understanding and had no further questions.  

## 2020-07-10 ENCOUNTER — Other Ambulatory Visit: Payer: Self-pay | Admitting: Hematology & Oncology

## 2020-07-10 DIAGNOSIS — C23 Malignant neoplasm of gallbladder: Secondary | ICD-10-CM

## 2020-07-18 ENCOUNTER — Other Ambulatory Visit: Payer: Self-pay | Admitting: Hematology & Oncology

## 2020-07-18 DIAGNOSIS — C23 Malignant neoplasm of gallbladder: Secondary | ICD-10-CM

## 2020-07-21 ENCOUNTER — Other Ambulatory Visit: Payer: Self-pay | Admitting: Hematology & Oncology

## 2020-07-21 ENCOUNTER — Inpatient Hospital Stay: Payer: Medicare Other

## 2020-07-21 ENCOUNTER — Other Ambulatory Visit: Payer: Self-pay

## 2020-07-21 DIAGNOSIS — C23 Malignant neoplasm of gallbladder: Secondary | ICD-10-CM

## 2020-07-21 DIAGNOSIS — R160 Hepatomegaly, not elsewhere classified: Secondary | ICD-10-CM

## 2020-07-21 DIAGNOSIS — Z5111 Encounter for antineoplastic chemotherapy: Secondary | ICD-10-CM | POA: Diagnosis not present

## 2020-07-21 LAB — BASIC METABOLIC PANEL - CANCER CENTER ONLY
Anion gap: 6 (ref 5–15)
BUN: 9 mg/dL (ref 8–23)
CO2: 33 mmol/L — ABNORMAL HIGH (ref 22–32)
Calcium: 9.1 mg/dL (ref 8.9–10.3)
Chloride: 100 mmol/L (ref 98–111)
Creatinine: 0.68 mg/dL (ref 0.44–1.00)
GFR, Estimated: >60 mL/min (ref >60)
Glucose, Bld: 142 mg/dL — ABNORMAL HIGH (ref 70–99)
Potassium: 3 mmol/L — ABNORMAL LOW (ref 3.5–5.1)
Sodium: 139 mmol/L (ref 135–145)

## 2020-07-21 LAB — CBC WITH DIFFERENTIAL (CANCER CENTER ONLY)
Abs Immature Granulocytes: 0.01 10*3/uL (ref 0.00–0.07)
Basophils Absolute: 0 10*3/uL (ref 0.0–0.1)
Basophils Relative: 0 %
Eosinophils Absolute: 0 10*3/uL (ref 0.0–0.5)
Eosinophils Relative: 0 %
HCT: 32.1 % — ABNORMAL LOW (ref 36.0–46.0)
Hemoglobin: 10 g/dL — ABNORMAL LOW (ref 12.0–15.0)
Immature Granulocytes: 0 %
Lymphocytes Relative: 26 %
Lymphs Abs: 1 10*3/uL (ref 0.7–4.0)
MCH: 25.6 pg — ABNORMAL LOW (ref 26.0–34.0)
MCHC: 31.2 g/dL (ref 30.0–36.0)
MCV: 82.1 fL (ref 80.0–100.0)
Monocytes Absolute: 0.4 10*3/uL (ref 0.1–1.0)
Monocytes Relative: 10 %
Neutro Abs: 2.3 10*3/uL (ref 1.7–7.7)
Neutrophils Relative %: 64 %
Platelet Count: 355 10*3/uL (ref 150–400)
RBC: 3.91 MIL/uL (ref 3.87–5.11)
RDW: 19.9 % — ABNORMAL HIGH (ref 11.5–15.5)
WBC Count: 3.7 10*3/uL — ABNORMAL LOW (ref 4.0–10.5)
nRBC: 0 % (ref 0.0–0.2)

## 2020-07-21 MED ORDER — SODIUM CHLORIDE 0.9 % IV SOLN
Freq: Once | INTRAVENOUS | Status: DC
  Filled 2020-07-21: qty 250

## 2020-07-21 MED ORDER — HEPARIN SOD (PORK) LOCK FLUSH 100 UNIT/ML IV SOLN
500.0000 [IU] | Freq: Once | INTRAVENOUS | Status: AC | PRN
  Administered 2020-07-21: 500 [IU]
  Filled 2020-07-21: qty 5

## 2020-07-21 MED ORDER — PALONOSETRON HCL INJECTION 0.25 MG/5ML
INTRAVENOUS | Status: AC
  Filled 2020-07-21: qty 5

## 2020-07-21 MED ORDER — SODIUM CHLORIDE 0.9 % IV SOLN
10.0000 mg | Freq: Once | INTRAVENOUS | Status: AC
  Administered 2020-07-21: 10 mg via INTRAVENOUS
  Filled 2020-07-21: qty 10

## 2020-07-21 MED ORDER — SODIUM CHLORIDE 0.9% FLUSH
10.0000 mL | INTRAVENOUS | Status: DC | PRN
  Administered 2020-07-21: 10 mL
  Filled 2020-07-21: qty 10

## 2020-07-21 MED ORDER — SODIUM CHLORIDE 0.9 % IV SOLN
Freq: Once | INTRAVENOUS | Status: AC
  Filled 2020-07-21: qty 250

## 2020-07-21 MED ORDER — SODIUM CHLORIDE 0.9 % IV SOLN
150.0000 mg | Freq: Once | INTRAVENOUS | Status: AC
  Administered 2020-07-21: 150 mg via INTRAVENOUS
  Filled 2020-07-21: qty 150

## 2020-07-21 MED ORDER — SODIUM CHLORIDE 0.9 % IV SOLN
800.0000 mg/m2 | Freq: Once | INTRAVENOUS | Status: AC
  Administered 2020-07-21: 1520 mg via INTRAVENOUS
  Filled 2020-07-21: qty 15.78

## 2020-07-21 MED ORDER — SODIUM CHLORIDE 0.9 % IV SOLN
319.6000 mg | Freq: Once | INTRAVENOUS | Status: AC
  Administered 2020-07-21: 320 mg via INTRAVENOUS
  Filled 2020-07-21: qty 32

## 2020-07-21 MED ORDER — PALONOSETRON HCL INJECTION 0.25 MG/5ML
0.2500 mg | Freq: Once | INTRAVENOUS | Status: AC
  Administered 2020-07-21: 0.25 mg via INTRAVENOUS

## 2020-07-21 NOTE — Progress Notes (Signed)
Leave carboplatin dose at 320 mg, AUC = 4, per Dr. Marin Olp. Orders changed to reflect this per his instructions.

## 2020-07-21 NOTE — Patient Instructions (Signed)

## 2020-07-28 ENCOUNTER — Inpatient Hospital Stay: Payer: Medicare Other

## 2020-07-28 ENCOUNTER — Other Ambulatory Visit: Payer: Self-pay

## 2020-07-28 ENCOUNTER — Encounter: Payer: Self-pay | Admitting: Hematology & Oncology

## 2020-07-28 ENCOUNTER — Encounter: Payer: Self-pay | Admitting: *Deleted

## 2020-07-28 ENCOUNTER — Inpatient Hospital Stay (HOSPITAL_BASED_OUTPATIENT_CLINIC_OR_DEPARTMENT_OTHER): Payer: Medicare Other | Admitting: Hematology & Oncology

## 2020-07-28 ENCOUNTER — Inpatient Hospital Stay: Payer: Medicare Other | Attending: Hematology & Oncology

## 2020-07-28 VITALS — BP 184/66 | HR 71

## 2020-07-28 VITALS — BP 182/72 | HR 70 | Temp 98.6°F | Resp 16 | Wt 173.0 lb

## 2020-07-28 DIAGNOSIS — Z7952 Long term (current) use of systemic steroids: Secondary | ICD-10-CM | POA: Diagnosis not present

## 2020-07-28 DIAGNOSIS — Z79899 Other long term (current) drug therapy: Secondary | ICD-10-CM | POA: Diagnosis not present

## 2020-07-28 DIAGNOSIS — D500 Iron deficiency anemia secondary to blood loss (chronic): Secondary | ICD-10-CM | POA: Diagnosis not present

## 2020-07-28 DIAGNOSIS — Z5111 Encounter for antineoplastic chemotherapy: Secondary | ICD-10-CM | POA: Diagnosis not present

## 2020-07-28 DIAGNOSIS — D631 Anemia in chronic kidney disease: Secondary | ICD-10-CM | POA: Diagnosis not present

## 2020-07-28 DIAGNOSIS — C787 Secondary malignant neoplasm of liver and intrahepatic bile duct: Secondary | ICD-10-CM | POA: Diagnosis present

## 2020-07-28 DIAGNOSIS — R160 Hepatomegaly, not elsewhere classified: Secondary | ICD-10-CM

## 2020-07-28 DIAGNOSIS — C23 Malignant neoplasm of gallbladder: Secondary | ICD-10-CM

## 2020-07-28 HISTORY — DX: Anemia in chronic kidney disease: D63.1

## 2020-07-28 LAB — CBC WITH DIFFERENTIAL (CANCER CENTER ONLY)
Abs Immature Granulocytes: 0.02 10*3/uL (ref 0.00–0.07)
Basophils Absolute: 0 10*3/uL (ref 0.0–0.1)
Basophils Relative: 1 %
Eosinophils Absolute: 0 10*3/uL (ref 0.0–0.5)
Eosinophils Relative: 1 %
HCT: 31.1 % — ABNORMAL LOW (ref 36.0–46.0)
Hemoglobin: 9.9 g/dL — ABNORMAL LOW (ref 12.0–15.0)
Immature Granulocytes: 1 %
Lymphocytes Relative: 41 %
Lymphs Abs: 0.9 10*3/uL (ref 0.7–4.0)
MCH: 25.7 pg — ABNORMAL LOW (ref 26.0–34.0)
MCHC: 31.8 g/dL (ref 30.0–36.0)
MCV: 80.8 fL (ref 80.0–100.0)
Monocytes Absolute: 0.1 10*3/uL (ref 0.1–1.0)
Monocytes Relative: 4 %
Neutro Abs: 1.2 10*3/uL — ABNORMAL LOW (ref 1.7–7.7)
Neutrophils Relative %: 52 %
Platelet Count: 456 10*3/uL — ABNORMAL HIGH (ref 150–400)
RBC: 3.85 MIL/uL — ABNORMAL LOW (ref 3.87–5.11)
RDW: 19.9 % — ABNORMAL HIGH (ref 11.5–15.5)
WBC Count: 2.2 10*3/uL — ABNORMAL LOW (ref 4.0–10.5)
nRBC: 0 % (ref 0.0–0.2)

## 2020-07-28 LAB — SAMPLE TO BLOOD BANK

## 2020-07-28 LAB — CMP (CANCER CENTER ONLY)
ALT: 19 U/L (ref 0–44)
AST: 19 U/L (ref 15–41)
Albumin: 3.4 g/dL — ABNORMAL LOW (ref 3.5–5.0)
Alkaline Phosphatase: 136 U/L — ABNORMAL HIGH (ref 38–126)
Anion gap: 6 (ref 5–15)
BUN: 9 mg/dL (ref 8–23)
CO2: 32 mmol/L (ref 22–32)
Calcium: 9 mg/dL (ref 8.9–10.3)
Chloride: 102 mmol/L (ref 98–111)
Creatinine: 0.55 mg/dL (ref 0.44–1.00)
GFR, Estimated: >60 mL/min (ref >60)
Glucose, Bld: 93 mg/dL (ref 70–99)
Potassium: 3.4 mmol/L — ABNORMAL LOW (ref 3.5–5.1)
Sodium: 140 mmol/L (ref 135–145)
Total Bilirubin: 0.2 mg/dL — ABNORMAL LOW (ref 0.3–1.2)
Total Protein: 6 g/dL — ABNORMAL LOW (ref 6.5–8.1)

## 2020-07-28 LAB — FERRITIN: Ferritin: 1130 ng/mL — ABNORMAL HIGH (ref 11–307)

## 2020-07-28 LAB — IRON AND TIBC
Iron: 100 ug/dL (ref 41–142)
Saturation Ratios: 44 % (ref 21–57)
TIBC: 230 ug/dL — ABNORMAL LOW (ref 236–444)
UIBC: 130 ug/dL (ref 120–384)

## 2020-07-28 MED ORDER — HEPARIN SOD (PORK) LOCK FLUSH 100 UNIT/ML IV SOLN
500.0000 [IU] | Freq: Once | INTRAVENOUS | Status: AC | PRN
  Administered 2020-07-28: 500 [IU]
  Filled 2020-07-28: qty 5

## 2020-07-28 MED ORDER — SODIUM CHLORIDE 0.9 % IV SOLN
800.0000 mg/m2 | Freq: Once | INTRAVENOUS | Status: AC
  Administered 2020-07-28: 1520 mg via INTRAVENOUS
  Filled 2020-07-28: qty 15.78

## 2020-07-28 MED ORDER — PROCHLORPERAZINE MALEATE 10 MG PO TABS
10.0000 mg | ORAL_TABLET | Freq: Once | ORAL | Status: AC
  Administered 2020-07-28: 10 mg via ORAL

## 2020-07-28 MED ORDER — PROCHLORPERAZINE MALEATE 10 MG PO TABS
ORAL_TABLET | ORAL | Status: AC
  Filled 2020-07-28: qty 1

## 2020-07-28 MED ORDER — COLD PACK MISC ONCOLOGY
1.0000 | Freq: Once | Status: DC | PRN
  Filled 2020-07-28: qty 1

## 2020-07-28 MED ORDER — SODIUM CHLORIDE 0.9% FLUSH
10.0000 mL | INTRAVENOUS | Status: DC | PRN
  Administered 2020-07-28: 10 mL
  Filled 2020-07-28: qty 10

## 2020-07-28 MED ORDER — SODIUM CHLORIDE 0.9 % IV SOLN
Freq: Once | INTRAVENOUS | Status: AC
  Filled 2020-07-28: qty 250

## 2020-07-28 NOTE — Patient Instructions (Signed)
Gemcitabine injection What is this medicine? GEMCITABINE (jem SYE ta been) is a chemotherapy drug. This medicine is used to treat many types of cancer like breast cancer, lung cancer, pancreatic cancer, and ovarian cancer. This medicine may be used for other purposes; ask your health care provider or pharmacist if you have questions. COMMON BRAND NAME(S): Gemzar, Infugem What should I tell my health care provider before I take this medicine? They need to know if you have any of these conditions:  blood disorders  infection  kidney disease  liver disease  lung or breathing disease, like asthma  recent or ongoing radiation therapy  an unusual or allergic reaction to gemcitabine, other chemotherapy, other medicines, foods, dyes, or preservatives  pregnant or trying to get pregnant  breast-feeding How should I use this medicine? This drug is given as an infusion into a vein. It is administered in a hospital or clinic by a specially trained health care professional. Talk to your pediatrician regarding the use of this medicine in children. Special care may be needed. Overdosage: If you think you have taken too much of this medicine contact a poison control center or emergency room at once. NOTE: This medicine is only for you. Do not share this medicine with others. What if I miss a dose? It is important not to miss your dose. Call your doctor or health care professional if you are unable to keep an appointment. What may interact with this medicine?  medicines to increase blood counts like filgrastim, pegfilgrastim, sargramostim  some other chemotherapy drugs like cisplatin  vaccines Talk to your doctor or health care professional before taking any of these medicines:  acetaminophen  aspirin  ibuprofen  ketoprofen  naproxen This list may not describe all possible interactions. Give your health care provider a list of all the medicines, herbs, non-prescription drugs, or  dietary supplements you use. Also tell them if you smoke, drink alcohol, or use illegal drugs. Some items may interact with your medicine. What should I watch for while using this medicine? Visit your doctor for checks on your progress. This drug may make you feel generally unwell. This is not uncommon, as chemotherapy can affect healthy cells as well as cancer cells. Report any side effects. Continue your course of treatment even though you feel ill unless your doctor tells you to stop. In some cases, you may be given additional medicines to help with side effects. Follow all directions for their use. Call your doctor or health care professional for advice if you get a fever, chills or sore throat, or other symptoms of a cold or flu. Do not treat yourself. This drug decreases your body's ability to fight infections. Try to avoid being around people who are sick. This medicine may increase your risk to bruise or bleed. Call your doctor or health care professional if you notice any unusual bleeding. Be careful brushing and flossing your teeth or using a toothpick because you may get an infection or bleed more easily. If you have any dental work done, tell your dentist you are receiving this medicine. Avoid taking products that contain aspirin, acetaminophen, ibuprofen, naproxen, or ketoprofen unless instructed by your doctor. These medicines may hide a fever. Do not become pregnant while taking this medicine or for 6 months after stopping it. Women should inform their doctor if they wish to become pregnant or think they might be pregnant. Men should not father a child while taking this medicine and for 3 months after stopping it.   There is a potential for serious side effects to an unborn child. Talk to your health care professional or pharmacist for more information. Do not breast-feed an infant while taking this medicine or for at least 1 week after stopping it. Men should inform their doctors if they wish  to father a child. This medicine may lower sperm counts. Talk with your doctor or health care professional if you are concerned about your fertility. What side effects may I notice from receiving this medicine? Side effects that you should report to your doctor or health care professional as soon as possible:  allergic reactions like skin rash, itching or hives, swelling of the face, lips, or tongue  breathing problems  pain, redness, or irritation at site where injected  signs and symptoms of a dangerous change in heartbeat or heart rhythm like chest pain; dizziness; fast or irregular heartbeat; palpitations; feeling faint or lightheaded, falls; breathing problems  signs of decreased platelets or bleeding - bruising, pinpoint red spots on the skin, black, tarry stools, blood in the urine  signs of decreased red blood cells - unusually weak or tired, feeling faint or lightheaded, falls  signs of infection - fever or chills, cough, sore throat, pain or difficulty passing urine  signs and symptoms of kidney injury like trouble passing urine or change in the amount of urine  signs and symptoms of liver injury like dark yellow or brown urine; general ill feeling or flu-like symptoms; light-colored stools; loss of appetite; nausea; right upper belly pain; unusually weak or tired; yellowing of the eyes or skin  swelling of ankles, feet, hands Side effects that usually do not require medical attention (report to your doctor or health care professional if they continue or are bothersome):  constipation  diarrhea  hair loss  loss of appetite  nausea  rash  vomiting This list may not describe all possible side effects. Call your doctor for medical advice about side effects. You may report side effects to FDA at 1-800-FDA-1088. Where should I keep my medicine? This drug is given in a hospital or clinic and will not be stored at home. NOTE: This sheet is a summary. It may not cover all  possible information. If you have questions about this medicine, talk to your doctor, pharmacist, or health care provider.  2020 Elsevier/Gold Standard (2017-11-07 18:06:11)  

## 2020-07-28 NOTE — Progress Notes (Signed)
OK to treat with BP value from today per Dr. Marin Olp.

## 2020-07-28 NOTE — Progress Notes (Signed)
Received call from patient's daughter prior to her appointment. Vanessa Garrett had questions regarding clarification on how many treatments/cycles patient has received and when her next MRI would be. I reviewed with her that Vanessa Garrett would be finishing up cycle 2 today and an MRI would likely be ordered after seeing Dr Marin Olp today. Vanessa Garrett was unaware that patient would be seeing the MD and was disappointed as she is usually with her mom during MD appointments. She asked that she be placed on speaker phone during the appointment. Message sent to Dr Antonieta Pert rooming RN asking her to make Dr Marin Olp aware that Vanessa Garrett wanted to be placed on speaker phone during the appointment.

## 2020-07-28 NOTE — Progress Notes (Signed)
Hematology and Oncology Follow Up Visit  Astin Rape [id number] 1941/01/11 79 y.o. 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 Achee is back for follow-up.  I must say that she "looks quite good.  I am very happy for her.  She actually tolerated treatment very nicely to date.  She had a very nice Thanksgiving.  She is with her family.  She has had no problems with nausea or vomiting.  Is been no diarrhea.  I know that she had any problems with bleeding.    She still has the anemia.  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  She has not yet received ESA therapy.  I really think this is going to be necessary for her.  She is really not iron deficient.   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  There is been no problems with pain.  She has had no leg swelling.  She has had no rashes.  There has been no fever.  We will have to see how her CA 19-9 looks.  This is markedly elevated.  This would be a reasonable tumor marker for [place] to monitor.  Overall, I would say her performance status is ECOG 2.    Medications:  Current Outpatient Medications:  .  dexamethasone (DECADRON) 4 MG tablet, Take 2 tablets (8 mg total) by mouth daily. Start the day after carboplatin chemotherapy for 3 days., Disp: 30 tablet, Rfl: 1 .  Iron-Vitamins (S.S.S. TONIC PO), Take 45 mLs by mouth every other day., Disp: , Rfl:  .  lidocaine-prilocaine (EMLA) cream, Apply to affected area once, Disp: 30 g, Rfl: 3 .  metoprolol tartrate (LOPRESSOR) 25 MG tablet, Take 12.5 mg by mouth 2 (two) times daily., Disp: , Rfl:  .  NON FORMULARY,  Take 2 tablets by mouth every other day. Mega Food - Blood Builder, Disp: , Rfl:  .  NON FORMULARY, 1.25 mg every other day. Beet Root Powder, Disp: , Rfl:  .  vitamin B-12 (CYANOCOBALAMIN) 1000 MCG tablet, Take 1,000 mcg by mouth daily., Disp: , Rfl:  .  VITAMIN D PO, Take 2 tablets by mouth daily., Disp: , Rfl:  .  dronabinol (MARINOL) 2.5 MG capsule, Take 1 capsule (2.5 mg total) by mouth 2 (two) times daily before a meal. (Patient taking differently: Take 2.5 mg by mouth 2 (two) times daily before a meal. 07/28/2020 Takes every other day.), Disp: 60 capsule, Rfl: 2 .  ondansetron (ZOFRAN) 8 MG tablet, Take 1 tablet (8 mg total) by mouth 2 (two) times daily as needed for refractory nausea / vomiting. Start on day 3 after carboplatin chemo. (Patient not taking: Reported on 07/28/2020), Disp: 30 tablet, Rfl: 1 .  prochlorperazine (COMPAZINE) 10 MG tablet, TAKE 1 TABLET(10 MG) BY MOUTH EVERY 6 HOURS AS NEEDED FOR NAUSEA OR VOMITING (Patient not taking: Reported on 07/28/2020), Disp: 30 tablet, Rfl: 1  Allergies: No Known Allergies  Past Medical History, Surgical history, Social history, and Family History were reviewed and updated.  Review of Systems: Review of Systems  Constitutional: Negative.   HENT:  Negative.  Eyes: Negative.   Respiratory: Negative.   Cardiovascular: Negative.   Gastrointestinal: Positive for blood in stool.  Endocrine: Negative.   Genitourinary: Negative.    Musculoskeletal: Negative.   Skin: Negative.   Neurological: Negative.   Hematological: Negative.   Psychiatric/Behavioral: Negative.     Physical Exam:  weight is 173 lb (78.5 kg). Her oral temperature is 98.6 F (37 C). Her blood pressure is 182/72 (abnormal) and her pulse is 70. Her respiration is 16 and oxygen saturation is 98%.   Wt Readings from Last 3 Encounters:  07/28/20 173 lb (78.5 kg)  07/02/20 169 lb (76.7 kg)  06/21/20 168 lb (76.2 kg)    Physical Exam Vitals reviewed.  HENT:     Head:  Normocephalic and atraumatic.  Eyes:     Pupils: Pupils are equal, round, and reactive to light.  Cardiovascular:     Rate and Rhythm: Normal rate and regular rhythm.     Heart sounds: Normal heart sounds.  Pulmonary:     Effort: Pulmonary effort is normal.     Breath sounds: Normal breath sounds.  Abdominal:     General: Bowel sounds are normal.     Palpations: Abdomen is soft.  Musculoskeletal:        General: No tenderness or deformity. Normal range of motion.     Cervical back: Normal range of motion.  Lymphadenopathy:     Cervical: No cervical adenopathy.  Skin:    General: Skin is warm and dry.     Findings: No erythema or rash.  Neurological:     Mental Status: She is alert and oriented to person, place, and time.  Psychiatric:        Behavior: Behavior normal.        Thought Content: Thought content normal.        Judgment: Judgment normal.    Lab Results  Component Value Date   WBC 2.2 (L) 07/28/2020   HGB 9.9 (L) 07/28/2020   HCT 31.1 (L) 07/28/2020   MCV 80.8 07/28/2020   PLT 456 (H) 07/28/2020     Chemistry      Component Value Date/Time   NA 139 07/21/2020 1110   K 3.0 (L) 07/21/2020 1110   CL 100 07/21/2020 1110   CO2 33 (H) 07/21/2020 1110   BUN 9 07/21/2020 1110   CREATININE 0.68 07/21/2020 1110      Component Value Date/Time   CALCIUM 9.1 07/21/2020 1110   ALKPHOS 104 06/21/2020 1353   AST 14 (L) 06/21/2020 1353   ALT 5 06/21/2020 1353   BILITOT 0.3 06/21/2020 1353      Impression and Plan: Ms. Trawick is a incredibly charming 79 year old African-American female.  She has stage IV adenocarcinoma of the gallbladder.  She is on systemic therapy right now.  Hopefully, we will see her response.  She is still anemic.  I will have to see about getting her set up with Retacrit.  We will have to set her up with a MRI of the liver and to see how everything looks.  An MRI is really the best way for [place] to assess for response.  Quality of life  clearly is our primary goal.  This will be day 8 of cycle #2 of treatment.  We will do the MRI in 2 weeks.  I will give her an extra week off this to try to get her blood counts up a little bit better.   Volanda Napoleon, MD 12/1/20218:41 AM

## 2020-07-28 NOTE — Progress Notes (Signed)
Okay to treat today with ANC 1.2 per Dr. Marin Olp.

## 2020-07-29 ENCOUNTER — Other Ambulatory Visit: Payer: Self-pay | Admitting: Family

## 2020-07-29 ENCOUNTER — Encounter: Payer: Self-pay | Admitting: *Deleted

## 2020-07-29 ENCOUNTER — Other Ambulatory Visit: Payer: Self-pay | Admitting: *Deleted

## 2020-07-29 ENCOUNTER — Telehealth: Payer: Self-pay | Admitting: Hematology & Oncology

## 2020-07-29 DIAGNOSIS — R160 Hepatomegaly, not elsewhere classified: Secondary | ICD-10-CM

## 2020-07-29 DIAGNOSIS — C23 Malignant neoplasm of gallbladder: Secondary | ICD-10-CM

## 2020-07-29 MED ORDER — METOPROLOL TARTRATE 25 MG PO TABS
25.0000 mg | ORAL_TABLET | Freq: Two times a day (BID) | ORAL | 2 refills | Status: DC
Start: 2020-07-29 — End: 2020-10-04

## 2020-07-29 NOTE — Progress Notes (Signed)
Patient's daughter, [name], calling to ask for clarification on Lopressor dosing. During the appointment Dr Marin Olp gave new orders, but they needed clarification. Spoke to Dr Marin Olp. He would like patient to take Lopressor 25mg  BID.   Called and reviewed with [name]. New prescription sent.   Oncology Nurse Navigator Documentation  Oncology Nurse Navigator Flowsheets 07/29/2020  Abnormal Finding Date -  Planned Course of Treatment -  Phase of Treatment -  Chemotherapy Actual Start Date: -  Navigator Follow Up Date: 08/18/2020  Navigator Follow Up Reason: Follow-up Appointment;Chemotherapy  Navigator Location [facility]  Referral Date to RadOnc/MedOnc -  Navigator Encounter Type Telephone  Telephone Medication Assistance;Incoming Call  Treatment Initiated Date -  Patient Visit Type MedOnc  Treatment Phase Active Tx  Barriers/Navigation Needs Coordination of Care;Education  Education Other  Interventions Medication Assistance  Acuity Level 2-Minimal Needs (1-2 Barriers Identified)  Referrals -  Coordination of Care Other  Education Method Verbal  Support Groups/Services Friends and Family  Time Spent with Patient 44

## 2020-07-29 NOTE — Telephone Encounter (Signed)
Appointments scheduled calendar printed & mailed per 12/1 los 

## 2020-07-30 ENCOUNTER — Ambulatory Visit: Payer: Medicare Other | Admitting: Hematology & Oncology

## 2020-07-30 ENCOUNTER — Other Ambulatory Visit: Payer: Medicare Other

## 2020-07-30 ENCOUNTER — Ambulatory Visit: Payer: Medicare Other

## 2020-07-30 LAB — CANCER ANTIGEN 19-9: CA 19-9: 6457 U/mL — ABNORMAL HIGH (ref 0–35)

## 2020-08-07 ENCOUNTER — Ambulatory Visit (HOSPITAL_BASED_OUTPATIENT_CLINIC_OR_DEPARTMENT_OTHER)
Admission: RE | Admit: 2020-08-07 | Discharge: 2020-08-07 | Disposition: A | Discharge status: Home / Self Care | Payer: Medicare Other | Source: Ambulatory Visit | Attending: Family | Admitting: Family

## 2020-08-07 ENCOUNTER — Other Ambulatory Visit: Payer: Self-pay

## 2020-08-07 DIAGNOSIS — R160 Hepatomegaly, not elsewhere classified: Secondary | ICD-10-CM | POA: Insufficient documentation

## 2020-08-07 DIAGNOSIS — C23 Malignant neoplasm of gallbladder: Secondary | ICD-10-CM | POA: Insufficient documentation

## 2020-08-07 IMAGING — MR MR ABDOMEN WO/W CM
10 of 18 series · 24 of 48 positions shown · IV contrast (gadavist)
Comparison: MRI abdomen [DATE].

CLINICAL DATA: Hepatobiliary cancer, surveillance.

EXAM:
MRI ABDOMEN WITHOUT AND WITH CONTRAST
TECHNIQUE: Multiplanar multisequence MR imaging of the abdomen was performed
both before and after the administration of intravenous contrast.
CONTRAST:  8mL GADAVIST GADOBUTROL 1 MMOL/ML IV SOLN

[Series 4: T2 · coronal · 6.0mm · 1.41mm/px · 1 of 34 slices shown (1 of 2)]
[im 1/34]
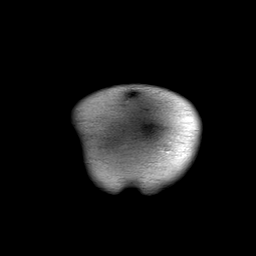

[Series 5: T2 fat-sat · axial · 6.0mm · 1.48mm/px · 1 of 38 slices shown]
[im 1/38]
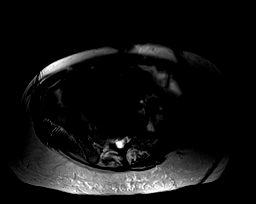

[Series 6: acr-in + out · axial · 6.0mm · 0.74mm/px · z∈[-87,+191]mm · 3 of 76 slices shown (1 of 2)]
[im 1/76]
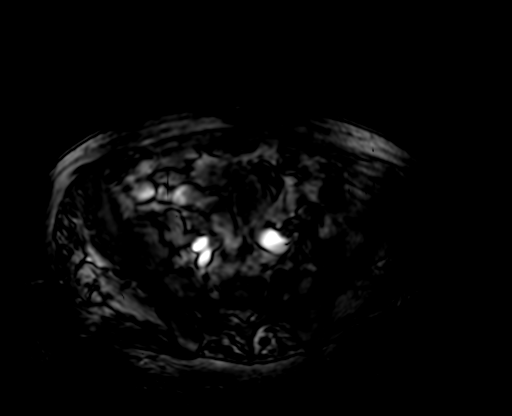
[im 38/76]
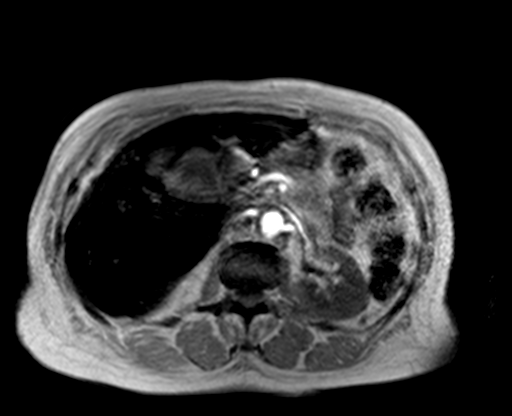
[im 76/76]
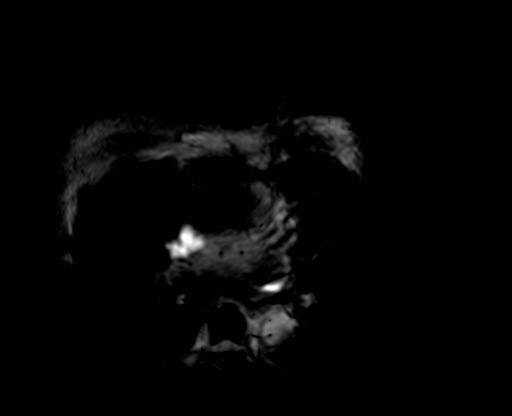

[Series 7: ep2d_[id number]_[id number] free breathing · axial · 6.0mm · 1.98mm/px · z∈[-73,+204]mm · 5 of 108 slices shown]
[im 1/108]
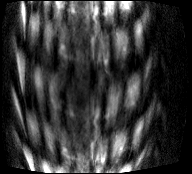
[im 27/108]
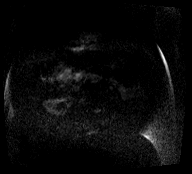
[im 54/108]
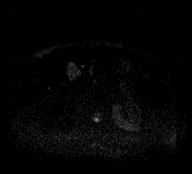
[im 81/108]
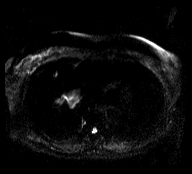
[im 108/108]
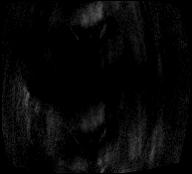

[Series 8: ep2d_[id number] free breathing_adc · axial · 6.0mm · 1.98mm/px · z∈[-73,+204]mm · 2 of 38 slices shown]
[im 1/38]
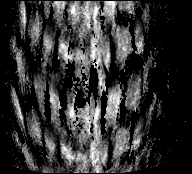
[im 38/38]
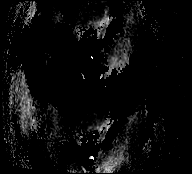

[Series 9: DWI · axial · 6.0mm · 0.74mm/px · z∈[-87,+191]mm · 2 of 38 slices shown]
[im 1/38]
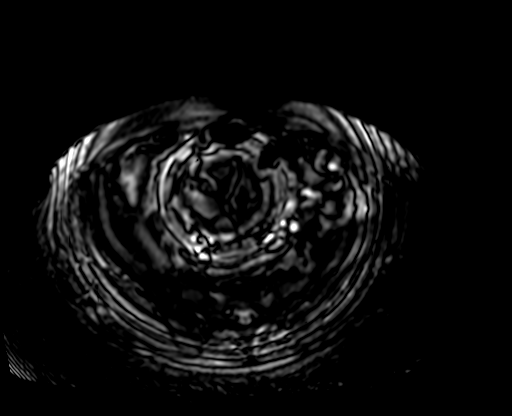
[im 38/38]
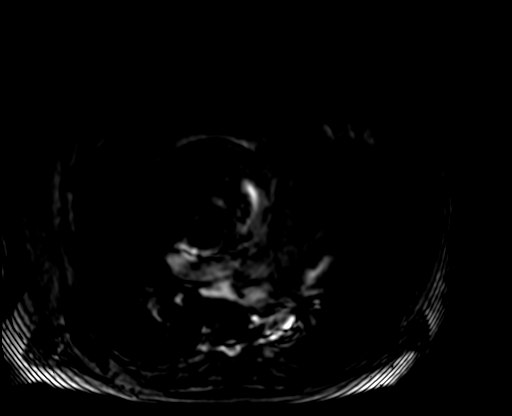

[Series 11: acr-in + out · axial · 6.0mm · 0.74mm/px · z∈[-87,+191]mm · 4 of 76 slices shown (2 of 2)]
[im 1/76]
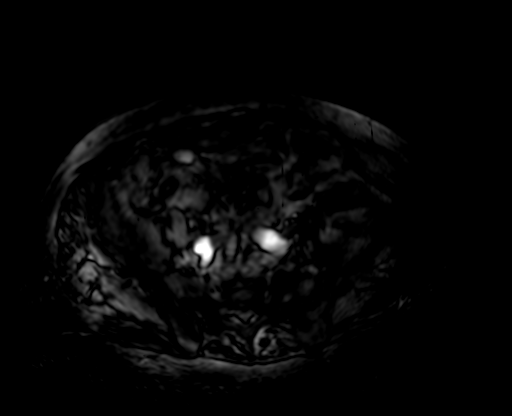
[im 26/76]
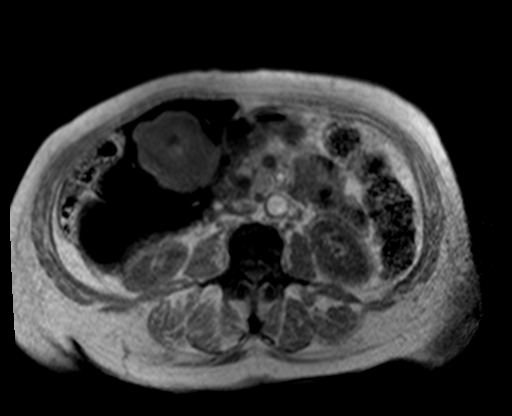
[im 51/76]
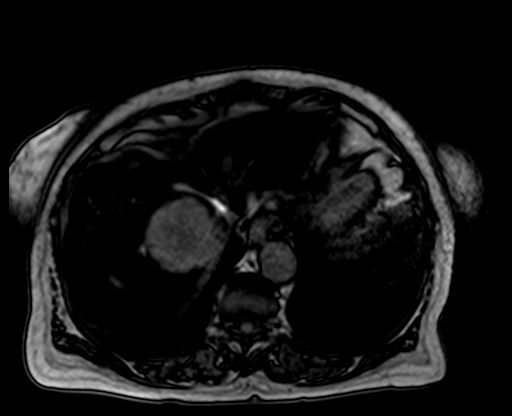
[im 76/76]
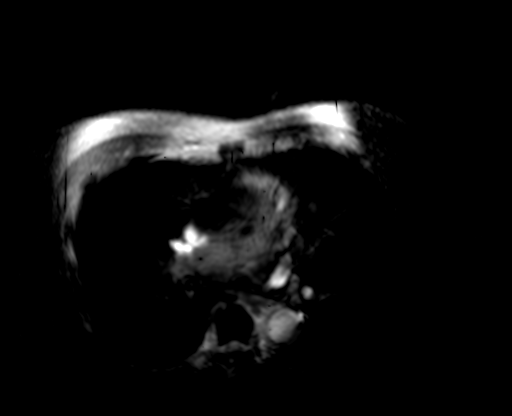

[Series 20: T2 · axial · 6.0mm · 1.48mm/px · z∈[-94,+184]mm · 2 of 38 slices shown (2 of 2)]
[im 1/38]
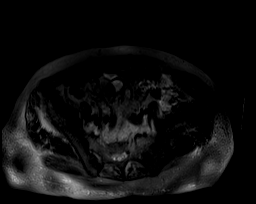
[im 38/38]
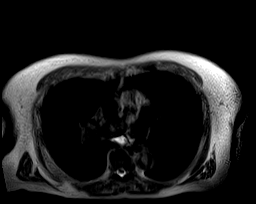

[Series 5002: hx · axial · 6.0mm · 0.78mm/px · 1 of 23 slices shown]
[im 1/23]
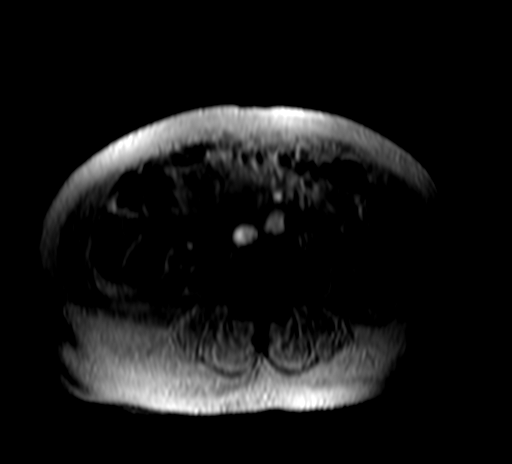

[Series 5003: sub_s21-s12_1 · axial · 4.4mm · 0.74mm/px · z∈[-85,+175]mm · 3 of 60 slices shown]
[im 1/60]
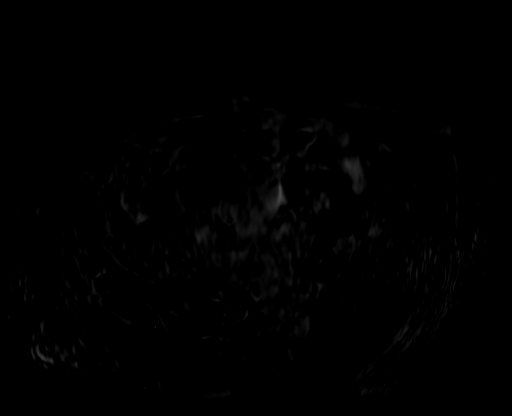
[im 30/60]
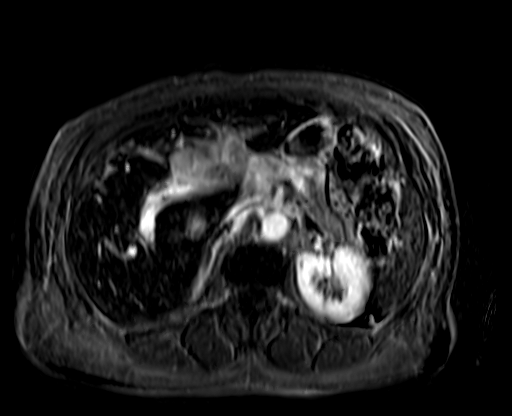
[im 60/60]
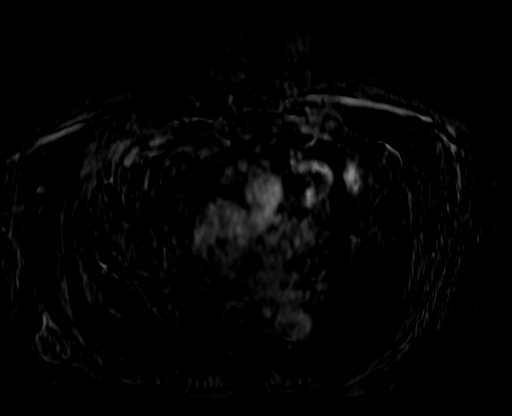

[24 of 48 positions shown; findings below may reference images not displayed]

FINDINGS: Lower chest: No acute findings.

Hepatobiliary: Slightly decreased size of the irregular enhancing
soft tissue mass in the gallbladder fossa which measures 7.3 x
cm previously 7.6 x 6.1 cm (series 20, image 26). There is similar
mild central intrahepatic biliary ductal dilatation in the left
hepatic lobe.

Slightly decreased size of the hepatic metastasis. No new suspicious
hepatic lesions. For reference:

-segment VII metastasis now measures 3.3 x 3.0 cm previously 3.7 x
3.1 cm (series 5, image 8).

-segment V/VI metastasis now measures 6.2 x 5.7 cm previously 6.9 x
6.7 cm (series 5, image 15).

-segment IVB metastasis now measures 4.9 x 3.7 cm previously 5.1 x
4.1 cm (series 5, image 18).

Pancreas: No mass, inflammatory changes, or other parenchymal
abnormality identified.

Spleen:  Within normal limits in size and appearance.

Adrenals/Urinary Tract: No masses identified. No evidence of
hydronephrosis.

Stomach/Bowel: Visualized portions within the abdomen are
unremarkable.

Vascular/Lymphatic: No evidence of abdominal aortic aneurysm.
Slightly decreased size the upper abdominal lymph nodes for instance
a previously indexed periportal lymph node now measures 1.0 cm
previously 1.1 cm and a previously indexed left periaortic lymph
node now measures 6 mm previously 9 mm (series 12, image 46).

Other:  None.

Musculoskeletal: No suspicious bone lesions identified.
IMPRESSION: Slight interval decrease in size in the enhancing soft tissue mass
in the gallbladder fossa, hepatic metastases and upper abdominal
lymph nodes.

## 2020-08-07 MED ORDER — GADOBUTROL 1 MMOL/ML IV SOLN
8.0000 mL | Freq: Once | INTRAVENOUS | Status: AC | PRN
  Administered 2020-08-07: 8 mL via INTRAVENOUS

## 2020-08-09 ENCOUNTER — Telehealth: Payer: Self-pay | Admitting: *Deleted

## 2020-08-09 NOTE — Telephone Encounter (Signed)
-----   Message from Volanda Napoleon, MD sent at 08/09/2020  2:54 PM EST ----- Call - the cancer is actually smaller!!!  Great job!!  Laurey Arrow

## 2020-08-09 NOTE — Telephone Encounter (Signed)
As noted below by Dr. Marin Olp, I informed the patient's daughter that the cancer is actually smaller! She verbalized understanding.

## 2020-08-10 ENCOUNTER — Telehealth: Payer: Self-pay | Admitting: Hematology & Oncology

## 2020-08-10 ENCOUNTER — Encounter: Payer: Self-pay | Admitting: *Deleted

## 2020-08-10 NOTE — Progress Notes (Signed)
Oncology Nurse Navigator Documentation  Oncology Nurse Navigator Flowsheets 08/10/2020  Abnormal Finding Date -  Planned Course of Treatment -  Phase of Treatment -  Chemotherapy Actual Start Date: -  Navigator Follow Up Date: 08/18/2020  Navigator Follow Up Reason: Follow-up Appointment;Chemotherapy  Navigator Location [facility]  Referral Date to RadOnc/MedOnc -  Navigator Encounter Type Scan Review  Telephone -  Treatment Initiated Date -  Patient Visit Type MedOnc  Treatment Phase Active Tx  Barriers/Navigation Needs -  Education -  Interventions -  Acuity -  Referrals -  Coordination of Care -  Education Method -  Support Groups/Services -  Time Spent with Patient 15

## 2020-08-10 NOTE — Telephone Encounter (Signed)
Called and spoke with daughter regarding appointment date/time changes per 12/14 staff message

## 2020-08-13 ENCOUNTER — Inpatient Hospital Stay (HOSPITAL_BASED_OUTPATIENT_CLINIC_OR_DEPARTMENT_OTHER): Payer: Medicare Other | Admitting: Hematology & Oncology

## 2020-08-13 ENCOUNTER — Encounter: Payer: Self-pay | Admitting: *Deleted

## 2020-08-13 ENCOUNTER — Inpatient Hospital Stay: Payer: Medicare Other

## 2020-08-13 ENCOUNTER — Encounter: Payer: Self-pay | Admitting: Hematology & Oncology

## 2020-08-13 ENCOUNTER — Other Ambulatory Visit: Payer: Self-pay

## 2020-08-13 VITALS — BP 164/71 | HR 69 | Wt 169.0 lb

## 2020-08-13 VITALS — BP 188/81 | HR 70 | Temp 98.5°F | Resp 18

## 2020-08-13 DIAGNOSIS — C23 Malignant neoplasm of gallbladder: Secondary | ICD-10-CM

## 2020-08-13 DIAGNOSIS — IMO0001 Reserved for inherently not codable concepts without codable children: Secondary | ICD-10-CM

## 2020-08-13 DIAGNOSIS — R160 Hepatomegaly, not elsewhere classified: Secondary | ICD-10-CM

## 2020-08-13 DIAGNOSIS — Z789 Other specified health status: Secondary | ICD-10-CM | POA: Diagnosis not present

## 2020-08-13 DIAGNOSIS — Z5111 Encounter for antineoplastic chemotherapy: Secondary | ICD-10-CM | POA: Diagnosis not present

## 2020-08-13 LAB — CMP (CANCER CENTER ONLY)
ALT: 9 U/L (ref 0–44)
AST: 16 U/L (ref 15–41)
Albumin: 3.7 g/dL (ref 3.5–5.0)
Alkaline Phosphatase: 121 U/L (ref 38–126)
Anion gap: 8 (ref 5–15)
BUN: 9 mg/dL (ref 8–23)
CO2: 31 mmol/L (ref 22–32)
Calcium: 9.2 mg/dL (ref 8.9–10.3)
Chloride: 101 mmol/L (ref 98–111)
Creatinine: 0.73 mg/dL (ref 0.44–1.00)
GFR, Estimated: >60 mL/min (ref >60)
Glucose, Bld: 147 mg/dL — ABNORMAL HIGH (ref 70–99)
Potassium: 3.1 mmol/L — ABNORMAL LOW (ref 3.5–5.1)
Sodium: 140 mmol/L (ref 135–145)
Total Bilirubin: 0.2 mg/dL — ABNORMAL LOW (ref 0.3–1.2)
Total Protein: 6.2 g/dL — ABNORMAL LOW (ref 6.5–8.1)

## 2020-08-13 LAB — CBC WITH DIFFERENTIAL (CANCER CENTER ONLY)
Abs Immature Granulocytes: 0.01 10*3/uL (ref 0.00–0.07)
Basophils Absolute: 0 10*3/uL (ref 0.0–0.1)
Basophils Relative: 0 %
Eosinophils Absolute: 0 10*3/uL (ref 0.0–0.5)
Eosinophils Relative: 0 %
HCT: 31.1 % — ABNORMAL LOW (ref 36.0–46.0)
Hemoglobin: 9.8 g/dL — ABNORMAL LOW (ref 12.0–15.0)
Immature Granulocytes: 0 %
Lymphocytes Relative: 21 %
Lymphs Abs: 1 10*3/uL (ref 0.7–4.0)
MCH: 26.1 pg (ref 26.0–34.0)
MCHC: 31.5 g/dL (ref 30.0–36.0)
MCV: 82.9 fL (ref 80.0–100.0)
Monocytes Absolute: 0.6 10*3/uL (ref 0.1–1.0)
Monocytes Relative: 12 %
Neutro Abs: 3.1 10*3/uL (ref 1.7–7.7)
Neutrophils Relative %: 67 %
Platelet Count: 293 10*3/uL (ref 150–400)
RBC: 3.75 MIL/uL — ABNORMAL LOW (ref 3.87–5.11)
RDW: 22.8 % — ABNORMAL HIGH (ref 11.5–15.5)
WBC Count: 4.7 10*3/uL (ref 4.0–10.5)
nRBC: 0 % (ref 0.0–0.2)

## 2020-08-13 MED ORDER — HEPARIN SOD (PORK) LOCK FLUSH 100 UNIT/ML IV SOLN
500.0000 [IU] | Freq: Once | INTRAVENOUS | Status: AC
  Administered 2020-08-13: 14:00:00 500 [IU] via INTRAVENOUS
  Filled 2020-08-13: qty 5

## 2020-08-13 MED ORDER — SODIUM CHLORIDE 0.9% FLUSH
10.0000 mL | Freq: Once | INTRAVENOUS | Status: AC
  Administered 2020-08-13: 10 mL via INTRAVENOUS
  Filled 2020-08-13: qty 10

## 2020-08-13 MED ORDER — TRIAMTERENE-HCTZ 75-50 MG PO TABS: 1.0000 | ORAL_TABLET | Freq: Every day | ORAL | 4 refills | Status: DC

## 2020-08-13 NOTE — Patient Instructions (Signed)

## 2020-08-13 NOTE — Addendum Note (Signed)
Addended by: Amelia Jo I on: 08/13/2020 01:53 PM   Modules accepted: Orders

## 2020-08-13 NOTE — Progress Notes (Signed)
Hematology and Oncology Follow Up Visit  Vanessa Garrett [id number] 16-Feb-1941 79 y.o. 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  Overall, I am very impressed with how well she looks.  We did do a follow-up MRI on her.  This was done on December 11.  Surprisingly, the MRI did show that she was responding.  She had decrease in size of her liver metastasis.  Importantly, there were no new metastasis.  I am little surprised, though, that her CA 19-9 was up.  More we first saw her back in October the level was 2200.  We will recheck in early December it was 6500.  She feels well.  Her blood pressure is on the high side.  We can have to add a diuretic.  She is on metoprolol.  Because of the high blood pressure, we cannot give her Retacrit to help with her anemia.  Her iron studies have been doing fairly well so she has not needed IV iron.  She has had a good appetite.  She had a wonderful Thanksgiving.  She and her family are driving up to [place], [place] on Sunday.  She has a house up there.  They will be up there for the week of Christmas.  She has had no obvious bleeding outside of occasional bleeding from the hemorrhoids.  She has had no cough.  Is no shortness of breath.  She has had no headache.  There is no mouth sores.  Overall, I would have to say her performance status is ECOG 1.     Medications:  Current Outpatient Medications:  .  dexamethasone (DECADRON) 4 MG tablet, Take 2 tablets (8 mg total) by mouth daily. Start the day after carboplatin chemotherapy for 3 days., Disp: 30 tablet, Rfl:  1 .  dronabinol (MARINOL) 2.5 MG capsule, Take 1 capsule (2.5 mg total) by mouth 2 (two) times daily before a meal. (Patient taking differently: Take 2.5 mg by mouth 2 (two) times daily before a meal. 07/28/2020 Takes every other day.), Disp: 60 capsule, Rfl: 2 .  Iron-Vitamins (S.S.S. TONIC PO), Take 45 mLs by mouth every other day., Disp: , Rfl:  .  lidocaine-prilocaine (EMLA) cream, Apply to affected area once, Disp: 30 g, Rfl: 3 .  metoprolol tartrate (LOPRESSOR) 25 MG tablet, Take 1 tablet (25 mg total) by mouth 2 (two) times daily., Disp: 60 tablet, Rfl: 2 .  NON FORMULARY, Take 2 tablets by mouth every other day. Mega Food - Blood Builder, Disp: , Rfl:  .  NON FORMULARY, 1.25 mg every other day. Beet Root Powder, Disp: , Rfl:  .  ondansetron (ZOFRAN) 8 MG tablet, Take 1 tablet (8 mg total) by mouth 2 (two) times daily as needed for refractory nausea / vomiting. Start on day 3 after carboplatin chemo. (Patient not taking: Reported on 07/28/2020), Disp: 30 tablet, Rfl: 1 .  prochlorperazine (COMPAZINE) 10 MG tablet, TAKE 1 TABLET(10 MG) BY MOUTH EVERY 6 HOURS AS NEEDED FOR NAUSEA OR VOMITING (Patient not taking: Reported on 07/28/2020), Disp: 30 tablet, Rfl: 1 .  vitamin B-12 (CYANOCOBALAMIN) 1000 MCG tablet, Take  1,000 mcg by mouth daily., Disp: , Rfl:  .  VITAMIN D PO, Take 2 tablets by mouth daily., Disp: , Rfl:   Allergies: No Known Allergies  Past Medical History, Surgical history, Social history, and Family History were reviewed and updated.  Review of Systems: Review of Systems  Constitutional: Negative.   HENT:  Negative.   Eyes: Negative.   Respiratory: Negative.   Cardiovascular: Negative.   Gastrointestinal: Positive for blood in stool.  Endocrine: Negative.   Genitourinary: Negative.    Musculoskeletal: Negative.   Skin: Negative.   Neurological: Negative.   Hematological: Negative.   Psychiatric/Behavioral: Negative.     Physical Exam:  weight is 169 lb (76.7 kg). Her  blood pressure is 164/71 (abnormal) and her pulse is 69.   Wt Readings from Last 3 Encounters:  08/13/20 169 lb (76.7 kg)  07/28/20 173 lb (78.5 kg)  07/02/20 169 lb (76.7 kg)    Physical Exam Vitals reviewed.  HENT:     Head: Normocephalic and atraumatic.  Eyes:     Pupils: Pupils are equal, round, and reactive to light.  Cardiovascular:     Rate and Rhythm: Normal rate and regular rhythm.     Heart sounds: Normal heart sounds.  Pulmonary:     Effort: Pulmonary effort is normal.     Breath sounds: Normal breath sounds.  Abdominal:     General: Bowel sounds are normal.     Palpations: Abdomen is soft.  Musculoskeletal:        General: No tenderness or deformity. Normal range of motion.     Cervical back: Normal range of motion.  Lymphadenopathy:     Cervical: No cervical adenopathy.  Skin:    General: Skin is warm and dry.     Findings: No erythema or rash.  Neurological:     Mental Status: She is alert and oriented to person, place, and time.  Psychiatric:        Behavior: Behavior normal.        Thought Content: Thought content normal.        Judgment: Judgment normal.    Lab Results  Component Value Date   WBC 4.7 08/13/2020   HGB 9.8 (L) 08/13/2020   HCT 31.1 (L) 08/13/2020   MCV 82.9 08/13/2020   PLT 293 08/13/2020     Chemistry      Component Value Date/Time   NA 140 08/13/2020 1200   K 3.1 (L) 08/13/2020 1200   CL 101 08/13/2020 1200   CO2 31 08/13/2020 1200   BUN 9 08/13/2020 1200   CREATININE 0.73 08/13/2020 1200      Component Value Date/Time   CALCIUM 9.2 08/13/2020 1200   ALKPHOS 121 08/13/2020 1200   AST 16 08/13/2020 1200   ALT 9 08/13/2020 1200   BILITOT 0.2 (L) 08/13/2020 1200      Impression and Plan: Vanessa Garrett is a incredibly charming 79 year old African-American female.  She has stage IV adenocarcinoma of the gallbladder.  She is on systemic therapy right now.  She has had 2 cycles of chemotherapy with carboplatinum /  Gemzar.  Hopefully, she will have a wonderful time up in [place].  I want her to be able to enjoy Christmas.  We will go ahead and move her third cycle of treatment back to December 29.  I think this would be very reasonable.  Hopefully her blood pressure will respond to the Maxide.  I have her on Maxide 75/50 and she will take  this daily.  Some of her family was on the cell phone listening.  I answered their questions.  I thanked him for the generosity with bringing in food for our staff.  We will see her back when she starts her third cycle of treatment on 29 December.    Volanda Napoleon, MD 12/17/20211:20 PM

## 2020-08-13 NOTE — Progress Notes (Signed)
Oncology Nurse Navigator Documentation  Oncology Nurse Navigator Flowsheets 08/13/2020  Abnormal Finding Date -  Planned Course of Treatment -  Phase of Treatment -  Chemotherapy Actual Start Date: -  Navigator Follow Up Date: 08/25/2020  Navigator Follow Up Reason: Follow-up Appointment;Chemotherapy  Navigator Location [facility]  Referral Date to RadOnc/MedOnc -  Navigator Encounter Type Appt/Treatment Plan Review  Telephone -  Treatment Initiated Date -  Patient Visit Type MedOnc  Treatment Phase Active Tx  Barriers/Navigation Needs Coordination of Care;Education  Education -  Interventions None Required  Acuity Level 2-Minimal Needs (1-2 Barriers Identified)  Referrals -  Coordination of Care -  Education Method -  Support Groups/Services Friends and Family  Time Spent with Patient 30

## 2020-08-14 LAB — CANCER ANTIGEN 19-9: CA 19-9: 5559 U/mL — ABNORMAL HIGH (ref 0–35)

## 2020-08-16 ENCOUNTER — Encounter: Payer: Self-pay | Admitting: *Deleted

## 2020-08-16 ENCOUNTER — Other Ambulatory Visit: Payer: Self-pay | Admitting: *Deleted

## 2020-08-16 DIAGNOSIS — C23 Malignant neoplasm of gallbladder: Secondary | ICD-10-CM

## 2020-08-16 LAB — FERRITIN: Ferritin: 830 ng/mL — ABNORMAL HIGH (ref 11–307)

## 2020-08-16 LAB — IRON AND TIBC
Iron: 49 ug/dL (ref 41–142)
Saturation Ratios: 20 % — ABNORMAL LOW (ref 21–57)
TIBC: 245 ug/dL (ref 236–444)
UIBC: 196 ug/dL (ref 120–384)

## 2020-08-16 MED ORDER — POTASSIUM CHLORIDE CRYS ER 20 MEQ PO TBCR
20.0000 meq | EXTENDED_RELEASE_TABLET | Freq: Every day | ORAL | 2 refills | Status: DC
Start: 2020-08-16 — End: 2020-11-03

## 2020-08-16 NOTE — Progress Notes (Signed)
Patient's daughter calling because during last weeks appointment, Dr Marin Olp mentioned prescribing a potassium supplement, however nothing was sent in.  Spoke with Dr Marin Olp. He would like K 82meq daily to be sent into pharmacy. Spoke with [name] and prescription reviewed. They are out of town, so prescription was sent to a pharmacy local to them.   Oncology Nurse Navigator Documentation  Oncology Nurse Navigator Flowsheets 08/16/2020  Abnormal Finding Date -  Planned Course of Treatment -  Phase of Treatment -  Chemotherapy Actual Start Date: -  Navigator Follow Up Date: 08/25/2020  Navigator Follow Up Reason: Follow-up Appointment  Navigator Location [facility]  Referral Date to RadOnc/MedOnc -  Navigator Encounter Type Telephone  Telephone Medication Assistance;Incoming Call  Treatment Initiated Date -  Patient Visit Type MedOnc  Treatment Phase Active Tx  Barriers/Navigation Needs Coordination of Care;Education  Education Other  Interventions Medication Assistance;Education  Acuity Level 2-Minimal Needs (1-2 Barriers Identified)  Referrals -  Coordination of Care -  Education Method Verbal  Support Groups/Services Friends and Family  Time Spent with Patient 69

## 2020-08-17 ENCOUNTER — Other Ambulatory Visit: Payer: Self-pay | Admitting: Family

## 2020-08-18 ENCOUNTER — Other Ambulatory Visit: Payer: Medicare Other

## 2020-08-18 ENCOUNTER — Ambulatory Visit: Payer: Medicare Other

## 2020-08-18 ENCOUNTER — Ambulatory Visit: Payer: Medicare Other | Admitting: Hematology & Oncology

## 2020-08-25 ENCOUNTER — Encounter: Payer: Self-pay | Admitting: *Deleted

## 2020-08-25 ENCOUNTER — Encounter: Payer: Self-pay | Admitting: Hematology & Oncology

## 2020-08-25 ENCOUNTER — Inpatient Hospital Stay: Payer: Medicare Other

## 2020-08-25 ENCOUNTER — Telehealth: Payer: Self-pay | Admitting: Family

## 2020-08-25 ENCOUNTER — Inpatient Hospital Stay (HOSPITAL_BASED_OUTPATIENT_CLINIC_OR_DEPARTMENT_OTHER): Payer: Medicare Other | Admitting: Family

## 2020-08-25 ENCOUNTER — Encounter: Payer: Self-pay | Admitting: Family

## 2020-08-25 ENCOUNTER — Other Ambulatory Visit: Payer: Self-pay

## 2020-08-25 VITALS — BP 155/67 | HR 70 | Temp 98.5°F | Resp 18 | Ht 61.0 in | Wt 165.0 lb

## 2020-08-25 DIAGNOSIS — D500 Iron deficiency anemia secondary to blood loss (chronic): Secondary | ICD-10-CM

## 2020-08-25 DIAGNOSIS — R160 Hepatomegaly, not elsewhere classified: Secondary | ICD-10-CM

## 2020-08-25 DIAGNOSIS — Z789 Other specified health status: Secondary | ICD-10-CM | POA: Diagnosis not present

## 2020-08-25 DIAGNOSIS — IMO0001 Reserved for inherently not codable concepts without codable children: Secondary | ICD-10-CM

## 2020-08-25 DIAGNOSIS — D631 Anemia in chronic kidney disease: Secondary | ICD-10-CM | POA: Diagnosis not present

## 2020-08-25 DIAGNOSIS — C23 Malignant neoplasm of gallbladder: Secondary | ICD-10-CM

## 2020-08-25 DIAGNOSIS — Z5111 Encounter for antineoplastic chemotherapy: Secondary | ICD-10-CM | POA: Diagnosis not present

## 2020-08-25 DIAGNOSIS — K909 Intestinal malabsorption, unspecified: Secondary | ICD-10-CM

## 2020-08-25 LAB — CMP (CANCER CENTER ONLY)
ALT: 8 U/L (ref 0–44)
AST: 15 U/L (ref 15–41)
Albumin: 3.8 g/dL (ref 3.5–5.0)
Alkaline Phosphatase: 119 U/L (ref 38–126)
Anion gap: 9 (ref 5–15)
BUN: 20 mg/dL (ref 8–23)
CO2: 29 mmol/L (ref 22–32)
Calcium: 9.5 mg/dL (ref 8.9–10.3)
Chloride: 96 mmol/L — ABNORMAL LOW (ref 98–111)
Creatinine: 1.24 mg/dL — ABNORMAL HIGH (ref 0.44–1.00)
GFR, Estimated: 44 mL/min — ABNORMAL LOW (ref >60)
Glucose, Bld: 113 mg/dL — ABNORMAL HIGH (ref 70–99)
Potassium: 3.6 mmol/L (ref 3.5–5.1)
Sodium: 134 mmol/L — ABNORMAL LOW (ref 135–145)
Total Bilirubin: 0.2 mg/dL — ABNORMAL LOW (ref 0.3–1.2)
Total Protein: 6.6 g/dL (ref 6.5–8.1)

## 2020-08-25 LAB — RETICULOCYTES
Immature Retic Fract: 10.7 % (ref 2.3–15.9)
RBC.: 3.95 MIL/uL (ref 3.87–5.11)
Retic Count, Absolute: 49.4 10*3/uL (ref 19.0–186.0)
Retic Ct Pct: 1.3 % (ref 0.4–3.1)

## 2020-08-25 LAB — CBC WITH DIFFERENTIAL (CANCER CENTER ONLY)
Abs Immature Granulocytes: 0.08 10*3/uL — ABNORMAL HIGH (ref 0.00–0.07)
Basophils Absolute: 0 10*3/uL (ref 0.0–0.1)
Basophils Relative: 1 %
Eosinophils Absolute: 0 10*3/uL (ref 0.0–0.5)
Eosinophils Relative: 1 %
HCT: 32.6 % — ABNORMAL LOW (ref 36.0–46.0)
Hemoglobin: 10.3 g/dL — ABNORMAL LOW (ref 12.0–15.0)
Immature Granulocytes: 1 %
Lymphocytes Relative: 16 %
Lymphs Abs: 1 10*3/uL (ref 0.7–4.0)
MCH: 26.3 pg (ref 26.0–34.0)
MCHC: 31.6 g/dL (ref 30.0–36.0)
MCV: 83.2 fL (ref 80.0–100.0)
Monocytes Absolute: 1.1 10*3/uL — ABNORMAL HIGH (ref 0.1–1.0)
Monocytes Relative: 17 %
Neutro Abs: 4 10*3/uL (ref 1.7–7.7)
Neutrophils Relative %: 64 %
Platelet Count: 381 10*3/uL (ref 150–400)
RBC: 3.92 MIL/uL (ref 3.87–5.11)
RDW: 23 % — ABNORMAL HIGH (ref 11.5–15.5)
WBC Count: 6.2 10*3/uL (ref 4.0–10.5)
nRBC: 0 % (ref 0.0–0.2)

## 2020-08-25 MED ORDER — SODIUM CHLORIDE 0.9% FLUSH
10.0000 mL | Freq: Once | INTRAVENOUS | Status: AC | PRN
  Administered 2020-08-25: 15:00:00 10 mL
  Filled 2020-08-25: qty 10

## 2020-08-25 MED ORDER — SODIUM CHLORIDE 0.9 % IV SOLN
800.0000 mg/m2 | Freq: Once | INTRAVENOUS | Status: AC
  Administered 2020-08-25: 14:00:00 1520 mg via INTRAVENOUS
  Filled 2020-08-25: qty 26.3

## 2020-08-25 MED ORDER — SODIUM CHLORIDE 0.9 % IV SOLN
319.6000 mg | Freq: Once | INTRAVENOUS | Status: AC
  Administered 2020-08-25: 14:00:00 320 mg via INTRAVENOUS
  Filled 2020-08-25: qty 32

## 2020-08-25 MED ORDER — HEPARIN SOD (PORK) LOCK FLUSH 100 UNIT/ML IV SOLN
500.0000 [IU] | Freq: Once | INTRAVENOUS | Status: AC | PRN
  Administered 2020-08-25: 15:00:00 500 [IU]
  Filled 2020-08-25: qty 5

## 2020-08-25 MED ORDER — SODIUM CHLORIDE 0.9 % IV SOLN
Freq: Once | INTRAVENOUS | Status: AC
  Filled 2020-08-25: qty 250

## 2020-08-25 MED ORDER — PALONOSETRON HCL INJECTION 0.25 MG/5ML
INTRAVENOUS | Status: AC
  Filled 2020-08-25: qty 5

## 2020-08-25 MED ORDER — COLD PACK MISC ONCOLOGY
1.0000 | Freq: Once | Status: DC | PRN
  Filled 2020-08-25: qty 1

## 2020-08-25 MED ORDER — SODIUM CHLORIDE 0.9 % IV SOLN
10.0000 mg | Freq: Once | INTRAVENOUS | Status: AC
  Administered 2020-08-25: 12:00:00 10 mg via INTRAVENOUS
  Filled 2020-08-25: qty 10

## 2020-08-25 MED ORDER — SODIUM CHLORIDE 0.9 % IV SOLN
510.0000 mg | Freq: Once | INTRAVENOUS | Status: AC
  Administered 2020-08-25: 510 mg via INTRAVENOUS
  Filled 2020-08-25: qty 510

## 2020-08-25 MED ORDER — PALONOSETRON HCL INJECTION 0.25 MG/5ML
0.2500 mg | Freq: Once | INTRAVENOUS | Status: AC
  Administered 2020-08-25: 12:00:00 0.25 mg via INTRAVENOUS

## 2020-08-25 MED ORDER — SODIUM CHLORIDE 0.9 % IV SOLN
150.0000 mg | Freq: Once | INTRAVENOUS | Status: AC
  Administered 2020-08-25: 13:00:00 150 mg via INTRAVENOUS
  Filled 2020-08-25: qty 150

## 2020-08-25 MED ORDER — HEPARIN SOD (PORK) LOCK FLUSH 100 UNIT/ML IV SOLN
250.0000 [IU] | Freq: Once | INTRAVENOUS | Status: DC | PRN
  Filled 2020-08-25: qty 5

## 2020-08-25 NOTE — Progress Notes (Signed)
Pt. to get chemo and iron today, hold on the Retacrit - per Dr. Marin Olp. Pt discharged in no apparent distress. Pt left ambulatory without assistance. Pt aware of discharge instructions and verbalized understanding and had no further questions.

## 2020-08-25 NOTE — Progress Notes (Signed)
Hematology and Oncology Follow Up Visit  Vanessa Garrett [id number] 1940/09/23 79 y.o. 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 She is doing well but has some mild fatigue at times.  CA 19-9 2 weeks ago was 5,559.  No fever, chills, n/v, cough, rash,  SOB, chest pain, palpitations, abdominal pain or changes in bowel or bladder habits.  She has lightheadedness at times when she gets up too quickly at night to go to the bathroom. She is careful about this.  No blood loss noted. No abnormal bruising, no petechiae.  She has chronic swelling in her feet and left ankle since her last son was born.  She has occasional positional numbness and tingling in her left hand (if she holds something too tight). No new falls or syncope.  She states that she has maintained a good appetite and has only been taking the Marinol as needed. She felt that she was eating too much when taking it twice a day. She feels that she is staying well hydrated. Her weight is down 4 lbs since her last visit. She notes that her breathing is much better with the weight loss.  She is walking regularly with her daughter and dog for exercise.   ECOG Performance Status: 1 - Symptomatic but completely ambulatory  Medications:  Allergies as of 08/25/2020   No Known Allergies     Medication List       Accurate as of August 25, 2020 11:09 AM. If you have any questions, ask your nurse or doctor.        dexamethasone 4 MG tablet Commonly known as: DECADRON Take 2 tablets (8 mg total) by mouth daily. Start the  day after carboplatin chemotherapy for 3 days.   dronabinol 2.5 MG capsule Commonly known as: MARINOL Take 1 capsule (2.5 mg total) by mouth 2 (two) times daily before a meal. What changed: additional instructions   lidocaine-prilocaine cream Commonly known as: EMLA Apply to affected area once   metoprolol tartrate 25 MG tablet Commonly known as: LOPRESSOR Take 1 tablet (25 mg total) by mouth 2 (two) times daily.   NON FORMULARY Take 2 tablets by mouth every other day. Mega Food - Blood Builder   NON FORMULARY 1.25 mg every other day. Beet Root Powder   ondansetron 8 MG tablet Commonly known as: Zofran Take 1 tablet (8 mg total) by mouth 2 (two) times daily as needed for refractory nausea / vomiting. Start on day 3 after carboplatin chemo.   potassium chloride SA 20 MEQ tablet Commonly known as: KLOR-CON Take 1 tablet (20 mEq total) by mouth daily.   prochlorperazine 10 MG tablet Commonly known as: COMPAZINE TAKE 1 TABLET(10 MG) BY MOUTH EVERY 6 HOURS AS NEEDED FOR NAUSEA OR VOMITING   S.S.S. TONIC PO Take 45 mLs by mouth every other day.   triamterene-hydrochlorothiazide 75-50 MG tablet Commonly known as: Maxzide Take 1 tablet by mouth daily.   vitamin B-12 1000 MCG tablet Commonly known as: CYANOCOBALAMIN Take 1,000 mcg by mouth daily.   VITAMIN D PO Take 2 tablets by mouth  daily.       Allergies: No Known Allergies  Past Medical History, Surgical history, Social history, and Family History were reviewed and updated.  Review of Systems: All other 10 point review of systems is negative.   Physical Exam:  height is 5\' 1"  (1.549 m) and weight is 165 lb (74.8 kg). Her oral temperature is 98.5 F (36.9 C). Her blood pressure is 155/67 (abnormal) and her pulse is 70. Her respiration is 18 and oxygen saturation is 98%.   Wt Readings from Last 3 Encounters:  08/25/20 165 lb (74.8 kg)  08/13/20 169 lb (76.7 kg)  07/28/20 173 lb (78.5 kg)    Ocular: Sclerae  unicteric, pupils equal, round and reactive to light Ear-nose-throat: Oropharynx clear, dentition fair Lymphatic: No cervical or supraclavicular adenopathy Lungs no rales or rhonchi, good excursion bilaterally Heart regular rate and rhythm, no murmur appreciated Abd soft, nontender, positive bowel sounds MSK no focal spinal tenderness, no joint edema Neuro: non-focal, well-oriented, appropriate affect Breasts: Deferred   Lab Results  Component Value Date   WBC 4.7 08/13/2020   HGB 9.8 (L) 08/13/2020   HCT 31.1 (L) 08/13/2020   MCV 82.9 08/13/2020   PLT 293 08/13/2020   Lab Results  Component Value Date   FERRITIN 830 (H) 08/13/2020   IRON 49 08/13/2020   TIBC 245 08/13/2020   UIBC 196 08/13/2020   IRONPCTSAT 20 (L) 08/13/2020   Lab Results  Component Value Date   RETICCTPCT 1.3 06/21/2020   RBC 3.75 (L) 08/13/2020   No results found for: KPAFRELGTCHN, LAMBDASER, KAPLAMBRATIO No results found for: IGGSERUM, IGA, IGMSERUM No results found for: Ronnald Ramp, A1GS, A2GS, Violet Baldy, MSPIKE, SPEI   Chemistry      Component Value Date/Time   NA 140 08/13/2020 1200   K 3.1 (L) 08/13/2020 1200   CL 101 08/13/2020 1200   CO2 31 08/13/2020 1200   BUN 9 08/13/2020 1200   CREATININE 0.73 08/13/2020 1200      Component Value Date/Time   CALCIUM 9.2 08/13/2020 1200   ALKPHOS 121 08/13/2020 1200   AST 16 08/13/2020 1200   ALT 9 08/13/2020 1200   BILITOT 0.2 (L) 08/13/2020 1200       Impression and Plan: Vanessa Garrett is a very pleasant 79 yo African American female with stage IV adenocarcinoma of the gallbladder.  We will proceed with cycle 3 today as planned per MD,.  She will also get IV iron.  She will try taking Marinol just once a day to maintain her current weight.  We will plan to repeat scans after cycle 5. Follow-up in 3 weeks.  She was encouraged to contact our office with any questions or concerns. We can certainly see her sooner if  needed.  Laverna Peace, NP 12/29/202111:09 AM

## 2020-08-25 NOTE — Telephone Encounter (Signed)
Appointments have been scheduled patient will get updates from [place] 12/29 los

## 2020-08-25 NOTE — Patient Instructions (Signed)
[facility] Discharge Instructions for Patients Receiving Chemotherapy  Today you received the following chemotherapy agents Gemzar, To help prevent nausea and vomiting after your treatment, we encourage you to take your nausea medication as prescribed.  If you develop nausea and vomiting that is not controlled by your nausea medication, call the clinic.   BELOW ARE SYMPTOMS THAT SHOULD BE REPORTED IMMEDIATELY:  *FEVER GREATER THAN 100.5 F  *CHILLS WITH OR WITHOUT FEVER  NAUSEA AND VOMITING THAT IS NOT CONTROLLED WITH YOUR NAUSEA MEDICATION  *UNUSUAL SHORTNESS OF BREATH  *UNUSUAL BRUISING OR BLEEDING  TENDERNESS IN MOUTH AND THROAT WITH OR WITHOUT PRESENCE OF ULCERS  *URINARY PROBLEMS  *BOWEL PROBLEMS  UNUSUAL RASH Items with * indicate a potential emergency and should be followed up as soon as possible.  Feel free to call the clinic should you have any questions or concerns. The clinic phone number is [phone].  Please show the [facility] at check-in to the Emergency Department and triage nurse.

## 2020-08-25 NOTE — Progress Notes (Signed)
Oncology Nurse Navigator Documentation  Oncology Nurse Navigator Flowsheets 08/25/2020  Abnormal Finding Date -  Planned Course of Treatment -  Phase of Treatment -  Chemotherapy Actual Start Date: -  Navigator Follow Up Date: 09/01/2020  Navigator Follow Up Reason: Follow-up Appointment;Chemotherapy  Navigator Location [facility]  Referral Date to RadOnc/MedOnc -  Navigator Encounter Type Appt/Treatment Plan Review  Telephone -  Treatment Initiated Date -  Patient Visit Type MedOnc  Treatment Phase Active Tx  Barriers/Navigation Needs Coordination of Care;Education  Education -  Interventions None Required  Acuity Level 2-Minimal Needs (1-2 Barriers Identified)  Referrals -  Coordination of Care -  Education Method -  Support Groups/Services Friends and Family  Time Spent with Patient 15

## 2020-08-26 LAB — FERRITIN: Ferritin: 739 ng/mL — ABNORMAL HIGH (ref 11–307)

## 2020-08-26 LAB — IRON AND TIBC
Iron: 45 ug/dL (ref 41–142)
Saturation Ratios: 18 % — ABNORMAL LOW (ref 21–57)
TIBC: 250 ug/dL (ref 236–444)
UIBC: 205 ug/dL (ref 120–384)

## 2020-08-26 LAB — CANCER ANTIGEN 19-9: CA 19-9: 3558 U/mL — ABNORMAL HIGH (ref 0–35)

## 2020-09-01 ENCOUNTER — Inpatient Hospital Stay: Payer: Medicare PPO | Attending: Hematology & Oncology

## 2020-09-01 ENCOUNTER — Encounter: Payer: Self-pay | Admitting: *Deleted

## 2020-09-01 ENCOUNTER — Inpatient Hospital Stay: Payer: Medicare PPO

## 2020-09-01 ENCOUNTER — Other Ambulatory Visit: Payer: Self-pay

## 2020-09-01 VITALS — BP 149/75 | HR 71 | Temp 98.2°F | Resp 20 | Wt 165.1 lb

## 2020-09-01 DIAGNOSIS — D701 Agranulocytosis secondary to cancer chemotherapy: Secondary | ICD-10-CM | POA: Insufficient documentation

## 2020-09-01 DIAGNOSIS — T451X5A Adverse effect of antineoplastic and immunosuppressive drugs, initial encounter: Secondary | ICD-10-CM | POA: Diagnosis not present

## 2020-09-01 DIAGNOSIS — D500 Iron deficiency anemia secondary to blood loss (chronic): Secondary | ICD-10-CM | POA: Diagnosis not present

## 2020-09-01 DIAGNOSIS — C787 Secondary malignant neoplasm of liver and intrahepatic bile duct: Secondary | ICD-10-CM | POA: Insufficient documentation

## 2020-09-01 DIAGNOSIS — R160 Hepatomegaly, not elsewhere classified: Secondary | ICD-10-CM

## 2020-09-01 DIAGNOSIS — Z5111 Encounter for antineoplastic chemotherapy: Secondary | ICD-10-CM | POA: Insufficient documentation

## 2020-09-01 DIAGNOSIS — Z95828 Presence of other vascular implants and grafts: Secondary | ICD-10-CM

## 2020-09-01 DIAGNOSIS — Z79899 Other long term (current) drug therapy: Secondary | ICD-10-CM | POA: Insufficient documentation

## 2020-09-01 DIAGNOSIS — C23 Malignant neoplasm of gallbladder: Secondary | ICD-10-CM | POA: Diagnosis present

## 2020-09-01 LAB — CBC WITH DIFFERENTIAL (CANCER CENTER ONLY)
Abs Immature Granulocytes: 0 10*3/uL (ref 0.00–0.07)
Basophils Absolute: 0.1 10*3/uL (ref 0.0–0.1)
Basophils Relative: 2 %
Eosinophils Absolute: 0 10*3/uL (ref 0.0–0.5)
Eosinophils Relative: 0 %
HCT: 30.3 % — ABNORMAL LOW (ref 36.0–46.0)
Hemoglobin: 9.8 g/dL — ABNORMAL LOW (ref 12.0–15.0)
Immature Granulocytes: 0 %
Lymphocytes Relative: 27 %
Lymphs Abs: 0.6 10*3/uL — ABNORMAL LOW (ref 0.7–4.0)
MCH: 27 pg (ref 26.0–34.0)
MCHC: 32.3 g/dL (ref 30.0–36.0)
MCV: 83.5 fL (ref 80.0–100.0)
Monocytes Absolute: 0.3 10*3/uL (ref 0.1–1.0)
Monocytes Relative: 11 %
Neutro Abs: 1.4 10*3/uL — ABNORMAL LOW (ref 1.7–7.7)
Neutrophils Relative %: 60 %
Platelet Count: 220 10*3/uL (ref 150–400)
RBC: 3.63 MIL/uL — ABNORMAL LOW (ref 3.87–5.11)
RDW: 21.9 % — ABNORMAL HIGH (ref 11.5–15.5)
WBC Count: 2.3 10*3/uL — ABNORMAL LOW (ref 4.0–10.5)
nRBC: 0 % (ref 0.0–0.2)

## 2020-09-01 LAB — BASIC METABOLIC PANEL - CANCER CENTER ONLY
Anion gap: 7 (ref 5–15)
BUN: 27 mg/dL — ABNORMAL HIGH (ref 8–23)
CO2: 30 mmol/L (ref 22–32)
Calcium: 9.3 mg/dL (ref 8.9–10.3)
Chloride: 94 mmol/L — ABNORMAL LOW (ref 98–111)
Creatinine: 1.18 mg/dL — ABNORMAL HIGH (ref 0.44–1.00)
GFR, Estimated: 47 mL/min — ABNORMAL LOW (ref >60)
Glucose, Bld: 126 mg/dL — ABNORMAL HIGH (ref 70–99)
Potassium: 3.8 mmol/L (ref 3.5–5.1)
Sodium: 131 mmol/L — ABNORMAL LOW (ref 135–145)

## 2020-09-01 MED ORDER — HEPARIN SOD (PORK) LOCK FLUSH 100 UNIT/ML IV SOLN
500.0000 [IU] | Freq: Once | INTRAVENOUS | Status: AC | PRN
  Administered 2020-09-01: 500 [IU]
  Filled 2020-09-01: qty 5

## 2020-09-01 MED ORDER — SODIUM CHLORIDE 0.9% FLUSH
10.0000 mL | INTRAVENOUS | Status: DC | PRN
Start: 2020-09-01 — End: 2020-09-01
  Administered 2020-09-01: 10 mL
  Filled 2020-09-01: qty 10

## 2020-09-01 MED ORDER — PROCHLORPERAZINE MALEATE 10 MG PO TABS
10.0000 mg | ORAL_TABLET | Freq: Once | ORAL | Status: AC
  Administered 2020-09-01: 10 mg via ORAL

## 2020-09-01 MED ORDER — HEPARIN SOD (PORK) LOCK FLUSH 100 UNIT/ML IV SOLN
500.0000 [IU] | Freq: Once | INTRAVENOUS | Status: DC
  Filled 2020-09-01: qty 5

## 2020-09-01 MED ORDER — SODIUM CHLORIDE 0.9 % IV SOLN
800.0000 mg/m2 | Freq: Once | INTRAVENOUS | Status: AC
  Administered 2020-09-01: 1520 mg via INTRAVENOUS
  Filled 2020-09-01: qty 26.3

## 2020-09-01 MED ORDER — PROCHLORPERAZINE MALEATE 10 MG PO TABS
ORAL_TABLET | ORAL | Status: AC
  Filled 2020-09-01: qty 1

## 2020-09-01 MED ORDER — SODIUM CHLORIDE 0.9% FLUSH
10.0000 mL | INTRAVENOUS | Status: DC | PRN
  Administered 2020-09-01: 10 mL via INTRAVENOUS
  Filled 2020-09-01: qty 10

## 2020-09-01 MED ORDER — SODIUM CHLORIDE 0.9 % IV SOLN
Freq: Once | INTRAVENOUS | Status: AC
Start: 2020-09-01 — End: 2020-09-01
  Filled 2020-09-01: qty 250

## 2020-09-01 MED ORDER — SODIUM CHLORIDE 0.9 % IV SOLN
Freq: Once | INTRAVENOUS | Status: DC
  Filled 2020-09-01: qty 250

## 2020-09-01 NOTE — Progress Notes (Signed)
Oncology Nurse Navigator Documentation  Oncology Nurse Navigator Flowsheets 09/01/2020  Abnormal Finding Date -  Planned Course of Treatment -  Phase of Treatment -  Chemotherapy Actual Start Date: -  Navigator Follow Up Date: 09/15/2020  Navigator Follow Up Reason: Follow-up Appointment;Chemotherapy  Navigator Location [facility]  Referral Date to RadOnc/MedOnc -  Navigator Encounter Type Appt/Treatment Plan Review  Telephone -  Treatment Initiated Date -  Patient Visit Type MedOnc  Treatment Phase Active Tx  Barriers/Navigation Needs Coordination of Care;Education  Education -  Interventions None Required  Acuity Level 2-Minimal Needs (1-2 Barriers Identified)  Referrals -  Coordination of Care -  Education Method -  Support Groups/Services Friends and Family  Time Spent with Patient 15

## 2020-09-01 NOTE — Patient Instructions (Signed)
Gemcitabine injection What is this medicine? GEMCITABINE (jem SYE ta been) is a chemotherapy drug. This medicine is used to treat many types of cancer like breast cancer, lung cancer, pancreatic cancer, and ovarian cancer. This medicine may be used for other purposes; ask your health care provider or pharmacist if you have questions. COMMON BRAND NAME(S): Gemzar, Infugem What should I tell my health care provider before I take this medicine? They need to know if you have any of these conditions:  blood disorders  infection  kidney disease  liver disease  lung or breathing disease, like asthma  recent or ongoing radiation therapy  an unusual or allergic reaction to gemcitabine, other chemotherapy, other medicines, foods, dyes, or preservatives  pregnant or trying to get pregnant  breast-feeding How should I use this medicine? This drug is given as an infusion into a vein. It is administered in a hospital or clinic by a specially trained health care professional. Talk to your pediatrician regarding the use of this medicine in children. Special care may be needed. Overdosage: If you think you have taken too much of this medicine contact a poison control center or emergency room at once. NOTE: This medicine is only for you. Do not share this medicine with others. What if I miss a dose? It is important not to miss your dose. Call your doctor or health care professional if you are unable to keep an appointment. What may interact with this medicine?  medicines to increase blood counts like filgrastim, pegfilgrastim, sargramostim  some other chemotherapy drugs like cisplatin  vaccines Talk to your doctor or health care professional before taking any of these medicines:  acetaminophen  aspirin  ibuprofen  ketoprofen  naproxen This list may not describe all possible interactions. Give your health care provider a list of all the medicines, herbs, non-prescription drugs, or  dietary supplements you use. Also tell them if you smoke, drink alcohol, or use illegal drugs. Some items may interact with your medicine. What should I watch for while using this medicine? Visit your doctor for checks on your progress. This drug may make you feel generally unwell. This is not uncommon, as chemotherapy can affect healthy cells as well as cancer cells. Report any side effects. Continue your course of treatment even though you feel ill unless your doctor tells you to stop. In some cases, you may be given additional medicines to help with side effects. Follow all directions for their use. Call your doctor or health care professional for advice if you get a fever, chills or sore throat, or other symptoms of a cold or flu. Do not treat yourself. This drug decreases your body's ability to fight infections. Try to avoid being around people who are sick. This medicine may increase your risk to bruise or bleed. Call your doctor or health care professional if you notice any unusual bleeding. Be careful brushing and flossing your teeth or using a toothpick because you may get an infection or bleed more easily. If you have any dental work done, tell your dentist you are receiving this medicine. Avoid taking products that contain aspirin, acetaminophen, ibuprofen, naproxen, or ketoprofen unless instructed by your doctor. These medicines may hide a fever. Do not become pregnant while taking this medicine or for 6 months after stopping it. Women should inform their doctor if they wish to become pregnant or think they might be pregnant. Men should not father a child while taking this medicine and for 3 months after stopping it.   There is a potential for serious side effects to an unborn child. Talk to your health care professional or pharmacist for more information. Do not breast-feed an infant while taking this medicine or for at least 1 week after stopping it. Men should inform their doctors if they wish  to father a child. This medicine may lower sperm counts. Talk with your doctor or health care professional if you are concerned about your fertility. What side effects may I notice from receiving this medicine? Side effects that you should report to your doctor or health care professional as soon as possible:  allergic reactions like skin rash, itching or hives, swelling of the face, lips, or tongue  breathing problems  pain, redness, or irritation at site where injected  signs and symptoms of a dangerous change in heartbeat or heart rhythm like chest pain; dizziness; fast or irregular heartbeat; palpitations; feeling faint or lightheaded, falls; breathing problems  signs of decreased platelets or bleeding - bruising, pinpoint red spots on the skin, black, tarry stools, blood in the urine  signs of decreased red blood cells - unusually weak or tired, feeling faint or lightheaded, falls  signs of infection - fever or chills, cough, sore throat, pain or difficulty passing urine  signs and symptoms of kidney injury like trouble passing urine or change in the amount of urine  signs and symptoms of liver injury like dark yellow or brown urine; general ill feeling or flu-like symptoms; light-colored stools; loss of appetite; nausea; right upper belly pain; unusually weak or tired; yellowing of the eyes or skin  swelling of ankles, feet, hands Side effects that usually do not require medical attention (report to your doctor or health care professional if they continue or are bothersome):  constipation  diarrhea  hair loss  loss of appetite  nausea  rash  vomiting This list may not describe all possible side effects. Call your doctor for medical advice about side effects. You may report side effects to FDA at 1-800-FDA-1088. Where should I keep my medicine? This drug is given in a hospital or clinic and will not be stored at home. NOTE: This sheet is a summary. It may not cover all  possible information. If you have questions about this medicine, talk to your doctor, pharmacist, or health care provider.  2020 Elsevier/Gold Standard (2017-11-07 18:06:11)  

## 2020-09-01 NOTE — Progress Notes (Signed)
Okay to treat today with ANC 1.4 per Laverna Peace, NP.

## 2020-09-01 NOTE — Patient Instructions (Signed)

## 2020-09-01 NOTE — Progress Notes (Signed)
Patient is discharged ambulatory with daughter in no distress.

## 2020-09-10 ENCOUNTER — Telehealth: Payer: Self-pay

## 2020-09-10 NOTE — Telephone Encounter (Signed)
S/w pts daughter and r/s 1/19 to 1/20     aom

## 2020-09-15 ENCOUNTER — Other Ambulatory Visit: Payer: Medicare Other

## 2020-09-15 ENCOUNTER — Ambulatory Visit: Payer: Medicare Other

## 2020-09-15 ENCOUNTER — Ambulatory Visit: Payer: Medicare Other | Admitting: Family

## 2020-09-16 ENCOUNTER — Inpatient Hospital Stay (HOSPITAL_BASED_OUTPATIENT_CLINIC_OR_DEPARTMENT_OTHER): Payer: Medicare PPO | Admitting: Family

## 2020-09-16 ENCOUNTER — Inpatient Hospital Stay: Payer: Medicare PPO

## 2020-09-16 ENCOUNTER — Encounter: Payer: Self-pay | Admitting: *Deleted

## 2020-09-16 ENCOUNTER — Other Ambulatory Visit: Payer: Self-pay

## 2020-09-16 ENCOUNTER — Encounter: Payer: Self-pay | Admitting: Family

## 2020-09-16 ENCOUNTER — Telehealth: Payer: Self-pay | Admitting: Family

## 2020-09-16 VITALS — BP 152/69 | HR 64 | Temp 98.4°F | Resp 18 | Wt 160.1 lb

## 2020-09-16 DIAGNOSIS — R160 Hepatomegaly, not elsewhere classified: Secondary | ICD-10-CM

## 2020-09-16 DIAGNOSIS — D500 Iron deficiency anemia secondary to blood loss (chronic): Secondary | ICD-10-CM | POA: Diagnosis not present

## 2020-09-16 DIAGNOSIS — D631 Anemia in chronic kidney disease: Secondary | ICD-10-CM

## 2020-09-16 DIAGNOSIS — Z5111 Encounter for antineoplastic chemotherapy: Secondary | ICD-10-CM | POA: Diagnosis not present

## 2020-09-16 DIAGNOSIS — C23 Malignant neoplasm of gallbladder: Secondary | ICD-10-CM

## 2020-09-16 DIAGNOSIS — Z789 Other specified health status: Secondary | ICD-10-CM

## 2020-09-16 DIAGNOSIS — IMO0001 Reserved for inherently not codable concepts without codable children: Secondary | ICD-10-CM

## 2020-09-16 LAB — CMP (CANCER CENTER ONLY)
ALT: 7 U/L (ref 0–44)
AST: 15 U/L (ref 15–41)
Albumin: 3.7 g/dL (ref 3.5–5.0)
Alkaline Phosphatase: 132 U/L — ABNORMAL HIGH (ref 38–126)
Anion gap: 7 (ref 5–15)
BUN: 28 mg/dL — ABNORMAL HIGH (ref 8–23)
CO2: 29 mmol/L (ref 22–32)
Calcium: 9.4 mg/dL (ref 8.9–10.3)
Chloride: 100 mmol/L (ref 98–111)
Creatinine: 1.44 mg/dL — ABNORMAL HIGH (ref 0.44–1.00)
GFR, Estimated: 37 mL/min — ABNORMAL LOW (ref >60)
Glucose, Bld: 91 mg/dL (ref 70–99)
Potassium: 3.8 mmol/L (ref 3.5–5.1)
Sodium: 136 mmol/L (ref 135–145)
Total Bilirubin: 0.3 mg/dL (ref 0.3–1.2)
Total Protein: 6.7 g/dL (ref 6.5–8.1)

## 2020-09-16 LAB — CBC WITH DIFFERENTIAL (CANCER CENTER ONLY)
Abs Immature Granulocytes: 0.02 10*3/uL (ref 0.00–0.07)
Basophils Absolute: 0 10*3/uL (ref 0.0–0.1)
Basophils Relative: 1 %
Eosinophils Absolute: 0.1 10*3/uL (ref 0.0–0.5)
Eosinophils Relative: 1 %
HCT: 28.4 % — ABNORMAL LOW (ref 36.0–46.0)
Hemoglobin: 9.2 g/dL — ABNORMAL LOW (ref 12.0–15.0)
Immature Granulocytes: 1 %
Lymphocytes Relative: 21 %
Lymphs Abs: 0.9 10*3/uL (ref 0.7–4.0)
MCH: 27.6 pg (ref 26.0–34.0)
MCHC: 32.4 g/dL (ref 30.0–36.0)
MCV: 85.3 fL (ref 80.0–100.0)
Monocytes Absolute: 0.7 10*3/uL (ref 0.1–1.0)
Monocytes Relative: 16 %
Neutro Abs: 2.6 10*3/uL (ref 1.7–7.7)
Neutrophils Relative %: 60 %
Platelet Count: 453 10*3/uL — ABNORMAL HIGH (ref 150–400)
RBC: 3.33 MIL/uL — ABNORMAL LOW (ref 3.87–5.11)
RDW: 22.8 % — ABNORMAL HIGH (ref 11.5–15.5)
WBC Count: 4.2 10*3/uL (ref 4.0–10.5)
nRBC: 0 % (ref 0.0–0.2)

## 2020-09-16 LAB — RETICULOCYTES
Immature Retic Fract: 15 % (ref 2.3–15.9)
RBC.: 3.38 MIL/uL — ABNORMAL LOW (ref 3.87–5.11)
Retic Count, Absolute: 58.8 10*3/uL (ref 19.0–186.0)
Retic Ct Pct: 1.7 % (ref 0.4–3.1)

## 2020-09-16 LAB — FERRITIN: Ferritin: 1539 ng/mL — ABNORMAL HIGH (ref 11–307)

## 2020-09-16 LAB — IRON AND TIBC
Iron: 51 ug/dL (ref 41–142)
Saturation Ratios: 19 % — ABNORMAL LOW (ref 21–57)
TIBC: 265 ug/dL (ref 236–444)
UIBC: 214 ug/dL (ref 120–384)

## 2020-09-16 MED ORDER — PALONOSETRON HCL INJECTION 0.25 MG/5ML
0.2500 mg | Freq: Once | INTRAVENOUS | Status: AC
  Administered 2020-09-16: 0.25 mg via INTRAVENOUS

## 2020-09-16 MED ORDER — SODIUM CHLORIDE 0.9 % IV SOLN
10.0000 mg | Freq: Once | INTRAVENOUS | Status: AC
  Administered 2020-09-16: 10 mg via INTRAVENOUS
  Filled 2020-09-16: qty 10

## 2020-09-16 MED ORDER — SODIUM CHLORIDE 0.9 % IV SOLN
319.6000 mg | Freq: Once | INTRAVENOUS | Status: AC
  Administered 2020-09-16: 320 mg via INTRAVENOUS
  Filled 2020-09-16: qty 32

## 2020-09-16 MED ORDER — SODIUM CHLORIDE 0.9 % IV SOLN
Freq: Once | INTRAVENOUS | Status: AC
  Filled 2020-09-16: qty 250

## 2020-09-16 MED ORDER — SODIUM CHLORIDE 0.9% FLUSH
10.0000 mL | INTRAVENOUS | Status: DC | PRN
  Administered 2020-09-16: 10 mL
  Filled 2020-09-16: qty 10

## 2020-09-16 MED ORDER — SODIUM CHLORIDE 0.9 % IV SOLN
150.0000 mg | Freq: Once | INTRAVENOUS | Status: AC
  Administered 2020-09-16: 150 mg via INTRAVENOUS
  Filled 2020-09-16: qty 150

## 2020-09-16 MED ORDER — HEPARIN SOD (PORK) LOCK FLUSH 100 UNIT/ML IV SOLN
500.0000 [IU] | Freq: Once | INTRAVENOUS | Status: AC | PRN
  Administered 2020-09-16: 500 [IU]
  Filled 2020-09-16: qty 5

## 2020-09-16 MED ORDER — SODIUM CHLORIDE 0.9 % IV SOLN
800.0000 mg/m2 | Freq: Once | INTRAVENOUS | Status: AC
  Administered 2020-09-16: 1520 mg via INTRAVENOUS
  Filled 2020-09-16: qty 26.3

## 2020-09-16 MED ORDER — PALONOSETRON HCL INJECTION 0.25 MG/5ML
INTRAVENOUS | Status: AC
  Filled 2020-09-16: qty 5

## 2020-09-16 NOTE — Telephone Encounter (Signed)
Appointments scheduled patient will get updated AVS at infusion appt today. Per 1/20 los

## 2020-09-16 NOTE — Progress Notes (Signed)
Hematology and Oncology Follow Up Visit  Vanessa Garrett [id number] [birth year] 80 y.o. 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 She is doing fairly well. She states that she has noted black spots in her vision when she first wakes up and has scheduled an appointment with her eye doctor for this Saturday.  She denies fatigue at this time.  CA 19-9 was down from 5,559 to 3,558 last month. Today's result is pending.  She is taking the Marinol once a day to help boost her appetite and it seems to help. She feels that she is staying well hydrated. Her weight is 160 lbs. She admits that she had not been taking the Marinol regularly because she does not want to gain weight. She is drinking a Boost daily.  No fever, chills, n/v, cough, rash, dizziness, SOB, chest pain, palpitations, abdominal pain or changes in bowel or bladder habits.  No blood loss noted. No abnormal bruising, no petechiae.  No tenderness, numbness or tingling in her extremities.  She has chronic swelling in the left lower extremity she states has been there since her last son was born. Pedal pulses are 2+. No redness, pitting or edema noted on exam.  No falls or syncope to report.   ECOG Performance Status: 1 - Symptomatic but completely ambulatory  Medications:  Allergies as of 09/16/2020   No Known Allergies     Medication List       Accurate as of September 16, 2020  8:51 AM. If you have any questions, ask your nurse or doctor.        dexamethasone 4 MG tablet Commonly known as: DECADRON Take 2 tablets (8 mg total) by mouth  daily. Start the day after carboplatin chemotherapy for 3 days.   dronabinol 2.5 MG capsule Commonly known as: MARINOL Take 1 capsule (2.5 mg total) by mouth 2 (two) times daily before a meal. What changed: additional instructions   lidocaine-prilocaine cream Commonly known as: EMLA Apply to affected area once   metoprolol tartrate 25 MG tablet Commonly known as: LOPRESSOR Take 1 tablet (25 mg total) by mouth 2 (two) times daily.   NON FORMULARY Take 2 tablets by mouth every other day. Mega Food - Blood Builder   NON FORMULARY 1.25 mg every other day. Beet Root Powder   ondansetron 8 MG tablet Commonly known as: Zofran Take 1 tablet (8 mg total) by mouth 2 (two) times daily as needed for refractory nausea / vomiting. Start on day 3 after carboplatin chemo.   potassium chloride SA 20 MEQ tablet Commonly known as: KLOR-CON Take 1 tablet (20 mEq total) by mouth daily.   prochlorperazine 10 MG tablet Commonly known as: COMPAZINE TAKE 1 TABLET(10 MG) BY MOUTH EVERY 6 HOURS AS NEEDED FOR NAUSEA OR VOMITING   S.S.S. TONIC PO Take 45 mLs by mouth every other day.   triamterene-hydrochlorothiazide 75-50 MG tablet Commonly known as: Maxzide Take 1 tablet by mouth daily.   vitamin B-12 1000 MCG tablet Commonly known as: CYANOCOBALAMIN Take 1,000 mcg by mouth daily.   VITAMIN D PO Take 2 tablets by mouth daily.       Allergies:  No Known Allergies  Past Medical History, Surgical history, Social history, and Family History were reviewed and updated.  Review of Systems: All other 10 point review of systems is negative.   Physical Exam:  vitals were not taken for this visit.   Wt Readings from Last 3 Encounters:  09/01/20 165 lb 1.3 oz (74.9 kg)  08/25/20 165 lb (74.8 kg)  08/13/20 169 lb (76.7 kg)    Ocular: Sclerae unicteric, pupils equal, round and reactive to light Ear-nose-throat: Oropharynx clear, dentition fair Lymphatic: No cervical or supraclavicular  adenopathy Lungs no rales or rhonchi, good excursion bilaterally Heart regular rate and rhythm, no murmur appreciated Abd soft, nontender, positive bowel sounds MSK no focal spinal tenderness, no joint edema Neuro: non-focal, well-oriented, appropriate affect Breasts: Deferred   Lab Results  Component Value Date   WBC 4.2 09/16/2020   HGB 9.2 (L) 09/16/2020   HCT 28.4 (L) 09/16/2020   MCV 85.3 09/16/2020   PLT 453 (H) 09/16/2020   Lab Results  Component Value Date   FERRITIN 739 (H) 08/25/2020   IRON 45 08/25/2020   TIBC 250 08/25/2020   UIBC 205 08/25/2020   IRONPCTSAT 18 (L) 08/25/2020   Lab Results  Component Value Date   RETICCTPCT 1.7 09/16/2020   RBC 3.33 (L) 09/16/2020   RBC 3.38 (L) 09/16/2020   No results found for: KPAFRELGTCHN, LAMBDASER, KAPLAMBRATIO No results found for: IGGSERUM, IGA, IGMSERUM No results found for: TOTALPROTELP, ALBUMINELP, A1GS, A2GS, Arnaldo Natal, GAMS, MSPIKE, SPEI   Chemistry      Component Value Date/Time   NA 136 09/16/2020 0800   K 3.8 09/16/2020 0800   CL 100 09/16/2020 0800   CO2 29 09/16/2020 0800   BUN 28 (H) 09/16/2020 0800   CREATININE 1.44 (H) 09/16/2020 0800      Component Value Date/Time   CALCIUM 9.4 09/16/2020 0800   ALKPHOS 132 (H) 09/16/2020 0800   AST 15 09/16/2020 0800   ALT 7 09/16/2020 0800   BILITOT 0.3 09/16/2020 0800       Impression and Plan: Vanessa Garrett is a very pleasant 80 yo African American female with stage IV adenocarcinoma of the gallbladder.  CA 19-9 has been coming down nicely. Result for today is pending.  We will proceed with cycle 4 today per MD.  We will plan to repeat cans after cycle 5.  She was encouraged to take her Marinol daily as prescribed but she states that she will only take it once a day, not twice. She is concerned about gaining too much weight. We talked about her weight loss and that she needs to maintain her weight but she is firm in her decision at this time.   Follow-up in 3 weeks.  Iron studies are pending. We will replace if needed.  She was encouraged to contact our office with any questions or concerns. We can certainly see her sooner if needed.   Laverna Peace, NP 1/20/20228:51 AM

## 2020-09-16 NOTE — Progress Notes (Signed)
Oncology Nurse Navigator Documentation  Oncology Nurse Navigator Flowsheets 09/16/2020  Abnormal Finding Date -  Planned Course of Treatment -  Phase of Treatment -  Chemotherapy Actual Start Date: -  Navigator Follow Up Date: 10/07/2020  Navigator Follow Up Reason: Follow-up Appointment;Chemotherapy  Navigator Location [facility]  Referral Date to RadOnc/MedOnc -  Navigator Encounter Type Appt/Treatment Plan Review  Telephone -  Treatment Initiated Date -  Patient Visit Type MedOnc  Treatment Phase Active Tx  Barriers/Navigation Needs No Barriers At This Time  Education -  Interventions None Required  Acuity Level 1-No Barriers  Referrals -  Coordination of Care -  Education Method -  Support Groups/Services Friends and Family  Time Spent with Patient 15

## 2020-09-16 NOTE — Patient Instructions (Signed)

## 2020-09-17 LAB — CANCER ANTIGEN 19-9: CA 19-9: 4593 U/mL — ABNORMAL HIGH (ref 0–35)

## 2020-09-22 ENCOUNTER — Inpatient Hospital Stay: Payer: Medicare PPO

## 2020-09-22 ENCOUNTER — Ambulatory Visit: Payer: Medicare Other

## 2020-09-22 ENCOUNTER — Other Ambulatory Visit: Payer: Self-pay

## 2020-09-22 VITALS — BP 155/71 | HR 65

## 2020-09-22 DIAGNOSIS — C23 Malignant neoplasm of gallbladder: Secondary | ICD-10-CM

## 2020-09-22 DIAGNOSIS — K909 Intestinal malabsorption, unspecified: Secondary | ICD-10-CM

## 2020-09-22 DIAGNOSIS — Z5111 Encounter for antineoplastic chemotherapy: Secondary | ICD-10-CM | POA: Diagnosis not present

## 2020-09-22 DIAGNOSIS — D500 Iron deficiency anemia secondary to blood loss (chronic): Secondary | ICD-10-CM

## 2020-09-22 LAB — BASIC METABOLIC PANEL - CANCER CENTER ONLY
Anion gap: 8 (ref 5–15)
BUN: 26 mg/dL — ABNORMAL HIGH (ref 8–23)
CO2: 28 mmol/L (ref 22–32)
Calcium: 9.4 mg/dL (ref 8.9–10.3)
Chloride: 97 mmol/L — ABNORMAL LOW (ref 98–111)
Creatinine: 1.47 mg/dL — ABNORMAL HIGH (ref 0.44–1.00)
GFR, Estimated: 36 mL/min — ABNORMAL LOW (ref >60)
Glucose, Bld: 97 mg/dL (ref 70–99)
Potassium: 4 mmol/L (ref 3.5–5.1)
Sodium: 133 mmol/L — ABNORMAL LOW (ref 135–145)

## 2020-09-22 LAB — CBC WITH DIFFERENTIAL (CANCER CENTER ONLY)
Abs Immature Granulocytes: 0.01 10*3/uL (ref 0.00–0.07)
Basophils Absolute: 0 10*3/uL (ref 0.0–0.1)
Basophils Relative: 2 %
Eosinophils Absolute: 0 10*3/uL (ref 0.0–0.5)
Eosinophils Relative: 1 %
HCT: 26.7 % — ABNORMAL LOW (ref 36.0–46.0)
Hemoglobin: 8.8 g/dL — ABNORMAL LOW (ref 12.0–15.0)
Immature Granulocytes: 1 %
Lymphocytes Relative: 32 %
Lymphs Abs: 0.4 10*3/uL — ABNORMAL LOW (ref 0.7–4.0)
MCH: 27.8 pg (ref 26.0–34.0)
MCHC: 33 g/dL (ref 30.0–36.0)
MCV: 84.2 fL (ref 80.0–100.0)
Monocytes Absolute: 0 10*3/uL — ABNORMAL LOW (ref 0.1–1.0)
Monocytes Relative: 2 %
Neutro Abs: 0.8 10*3/uL — ABNORMAL LOW (ref 1.7–7.7)
Neutrophils Relative %: 62 %
Platelet Count: 360 10*3/uL (ref 150–400)
RBC: 3.17 MIL/uL — ABNORMAL LOW (ref 3.87–5.11)
RDW: 21.9 % — ABNORMAL HIGH (ref 11.5–15.5)
WBC Count: 1.3 10*3/uL — ABNORMAL LOW (ref 4.0–10.5)
nRBC: 0 % (ref 0.0–0.2)

## 2020-09-22 MED ORDER — HEPARIN SOD (PORK) LOCK FLUSH 100 UNIT/ML IV SOLN
500.0000 [IU] | Freq: Once | INTRAVENOUS | Status: AC | PRN
  Administered 2020-09-22: 500 [IU]
  Filled 2020-09-22: qty 5

## 2020-09-22 MED ORDER — SODIUM CHLORIDE 0.9 % IV SOLN
Freq: Once | INTRAVENOUS | Status: AC
  Filled 2020-09-22: qty 250

## 2020-09-22 MED ORDER — SODIUM CHLORIDE 0.9% FLUSH
10.0000 mL | Freq: Once | INTRAVENOUS | Status: AC | PRN
  Administered 2020-09-22: 10 mL
  Filled 2020-09-22: qty 10

## 2020-09-22 NOTE — Patient Instructions (Addendum)
Dehydration, Adult Dehydration is condition in which there is not enough water or other fluids in the body. This happens when a person loses more fluids than he or she takes in. Important body parts cannot work right without the right amount of fluids. Any loss of fluids from the body can cause dehydration. Dehydration can be mild, worse, or very bad. It should be treated right away to keep it from getting very bad. What are the causes? This condition may be caused by:  Conditions that cause loss of water or other fluids, such as: ? Watery poop (diarrhea). ? Vomiting. ? Sweating a lot. ? Peeing (urinating) a lot.  Not drinking enough fluids, especially when you: ? Are ill. ? Are doing things that take a lot of energy to do.  Other illnesses and conditions, such as fever or infection.  Certain medicines, such as medicines that take extra fluid out of the body (diuretics).  Lack of safe drinking water.  Not being able to get enough water and food. What increases the risk? The following factors may make you more likely to develop this condition:  Having a long-term (chronic) illness that has not been treated the right way, such as: ? Diabetes. ? Heart disease. ? Kidney disease.  Being 65 years of age or older.  Having a disability.  Living in a place that is high above the ground or sea (high in altitude). The thinner, dried air causes more fluid loss.  Doing exercises that put stress on your body for a long time. What are the signs or symptoms? Symptoms of dehydration depend on how bad it is. Mild or worse dehydration  Thirst.  Dry lips or dry mouth.  Feeling dizzy or light-headed, especially when you stand up from sitting.  Muscle cramps.  Your body making: ? Dark pee (urine). Pee may be the color of tea. ? Less pee than normal. ? Less tears than normal.  Headache. Very bad dehydration  Changes in skin. Skin may: ? Be cold to the touch (clammy). ? Be blotchy  or pale. ? Not go back to normal right after you lightly pinch it and let it go.  Little or no tears, pee, or sweat.  Changes in vital signs, such as: ? Fast breathing. ? Low blood pressure. ? Weak pulse. ? Pulse that is more than 100 beats a minute when you are sitting still.  Other changes, such as: ? Feeling very thirsty. ? Eyes that look hollow (sunken). ? Cold hands and feet. ? Being mixed up (confused). ? Being very tired (lethargic) or having trouble waking from sleep. ? Short-term weight loss. ? Loss of consciousness. How is this treated? Treatment for this condition depends on how bad it is. Treatment should start right away. Do not wait until your condition gets very bad. Very bad dehydration is an emergency. You will need to go to a hospital.  Mild or worse dehydration can be treated at home. You may be asked to: ? Drink more fluids. ? Drink an oral rehydration solution (ORS). This drink helps get the right amounts of fluids and salts and minerals in the blood (electrolytes).  Very bad dehydration can be treated: ? With fluids through an IV tube. ? By getting normal levels of salts and minerals in your blood. This is often done by giving salts and minerals through a tube. The tube is passed through your nose and into your stomach. ? By treating the root cause. Follow these instructions at   home: Oral rehydration solution If told by your doctor, drink an ORS:  Make an ORS. Use instructions on the package.  Start by drinking small amounts, about  cup (120 mL) every 5-10 minutes.  Slowly drink more until you have had the amount that your doctor said to have. Eating and drinking  Drink enough clear fluid to keep your pee pale yellow. If you were told to drink an ORS, finish the ORS first. Then, start slowly drinking other clear fluids. Drink fluids such as: ? Water. Do not drink only water. Doing that can make the salt (sodium) level in your body get too low. ? Water  from ice chips you suck on. ? Fruit juice that you have added water to (diluted). ? Low-calorie sports drinks.  Eat foods that have the right amounts of salts and minerals, such as: ? Bananas. ? Oranges. ? Potatoes. ? Tomatoes. ? Spinach.  Do not drink alcohol.  Avoid: ? Drinks that have a lot of sugar. These include:  High-calorie sports drinks.  Fruit juice that you did not add water to.  Soda.  Caffeine. ? Foods that are greasy or have a lot of fat or sugar.        General instructions  Take over-the-counter and prescription medicines only as told by your doctor.  Do not take salt tablets. Doing that can make the salt level in your body get too high.  Return to your normal activities as told by your doctor. Ask your doctor what activities are safe for you.  Keep all follow-up visits as told by your doctor. This is important. Contact a doctor if:  You have pain in your belly (abdomen) and the pain: ? Gets worse. ? Stays in one place.  You have a rash.  You have a stiff neck.  You get angry or annoyed (irritable) more easily than normal.  You are more tired or have a harder time waking than normal.  You feel: ? Weak or dizzy. ? Very thirsty. Get help right away if you have:  Any symptoms of very bad dehydration.  Symptoms of vomiting, such as: ? You cannot eat or drink without vomiting. ? Your vomiting gets worse or does not go away. ? Your vomit has blood or green stuff in it.  Symptoms that get worse with treatment.  A fever.  A very bad headache.  Problems with peeing or pooping (having a bowel movement), such as: ? Watery poop that gets worse or does not go away. ? Blood in your poop (stool). This may cause poop to look black and tarry. ? Not peeing in 6-8 hours. ? Peeing only a small amount of very dark pee in 6-8 hours.  Trouble breathing. These symptoms may be an emergency. Do not wait to see if the symptoms will go away. Get medical  help right away. Call your local emergency services (911 in the [place] Do not drive yourself to the hospital. Summary  Dehydration is a condition in which there is not enough water or other fluids in the body. This happens when a person loses more fluids than he or she takes in.  Treatment for this condition depends on how bad it is. Treatment should be started right away. Do not wait until your condition gets very bad.  Drink enough clear fluid to keep your pee pale yellow. If you were told to drink an oral rehydration solution (ORS), finish the ORS first. Then, start slowly drinking other clear fluids.  Take over-the-counter and prescription medicines only as told by your doctor.  Get help right away if you have any symptoms of very bad dehydration. This information is not intended to replace advice given to you by your health care provider. Make sure you discuss any questions you have with your health care provider. Document Revised: 03/27/2019 Document Reviewed: 03/27/2019 Elsevier Patient Education  2021 [employer].   Neutropenia Neutropenia is a condition that occurs when you have a lower-than-normal level of a type of white blood cell (neutrophil) in your body. Neutrophils are made in the spongy center of large bones (bone marrow), and they fight infections. Neutrophils are your body's main defense against bacterial and fungal infections. The fewer neutrophils you have and the longer your body remains without them, the greater your risk of getting a severe infection. What are the causes? This condition can occur if your body uses up or destroys neutrophils faster than your bone marrow can make them. Neutropenia may be caused by:  A bacterial or fungal infection.  Allergic disorders.  Reactions to some medicines.  An autoimmune disease.  An enlarged spleen. This condition can also occur if your bone marrow does not produce enough neutrophils. This problem may be caused  by:  Cancer.  Cancer treatments, such as radiation or chemotherapy.  Viral infections.  Medicines, such as phenytoin.  Vitamin B12 deficiency.  Diseases of the bone marrow.  Environmental toxins, such as insecticides. What are the signs or symptoms? This condition does not usually cause symptoms. If symptoms are present, they are usually caused by an underlying infection. Symptoms of an infection may include:  Fever.  Chills.  Swollen glands.  Oral or anal ulcers.  Cough and shortness of breath.  Rash.  Skin infection.  Fatigue. How is this diagnosed? Your health care provider may suspect neutropenia if you have:  A condition that may cause neutropenia.  Symptoms during or after treatment for cancer.  Symptoms of infection, especially fever.  Frequent and unusual infections. This condition is diagnosed based on your medical history and a physical exam. Tests will also be done, such as:  A complete blood count (CBC).  A procedure to collect a sample of bone marrow for examination (bone marrow biopsy).  A chest X-ray.  A urine culture.  A blood culture. How is this treated? Treatment depends on the underlying cause and severity of your condition. Mild neutropenia may not require treatment. Treatment may include medicines, such as:  Antibiotic medicine given through an IV.  Antiviral medicines.  Antifungal medicines.  A medicine to increase neutrophil production (colony-stimulating factor). You may get this drug through an IV or by injection.  Steroids given through an IV. If an underlying condition is causing neutropenia, you may need treatment for that condition. If medicines or cancer treatments are causing neutropenia, your health care provider may have you stop the medicines or treatment. Follow these instructions at home: Medicines  Take over-the-counter and prescription medicines only as told by your health care provider.  Get a seasonal flu  shot (influenza vaccine).  Avoid people who received a vaccine in the past 30 days if that vaccine contained a live version of the germ (live vaccine). You should not get a live vaccine. Common live vaccines are polio, MMR, chicken pox, and shingles vaccines.   Eating and drinking  Do not share food utensils.  Do not eat unpasteurized foods.  Do not eat raw or undercooked meat, eggs, or seafood.  Do not eat  unwashed, raw fruits or vegetables. Lifestyle  Avoid exposure to groups of people or children.  Avoid being around people who are sick.  Avoid being around dirt or dust, such as in construction areas or gardens.  Do not provide direct care for pets. Avoid animal droppings. Do not clean litter boxes and bird cages.  Do not have sex unless your health care provider has approved. Hygiene  Bathe daily.  Clean the area between the genitals and the anus (perineal area) after you urinate or have a bowel movement. If you are female, wipe from front to back.  Brush your teeth with a soft toothbrush before and after meals.  Do not use a regular razor. Use an electric razor to remove hair.  Wash your hands often. Make sure others who come in contact with you also wash their hands. If soap and water are not available, use hand sanitizer.   General instructions  Follow any precautions as told by your health care provider to reduce your risk for injury or infection.  Take actions to avoid cuts and burns. For example: ? Be cautious when you use knives. Always cut away from yourself. ? Keep knives in protective sheaths or guards when not in use. ? Use oven mitts when you cook with a hot stove, oven, or grill. ? Stand a safe distance away from open fires.  Do not use tampons, enemas, or rectal suppositories unless your health care provider has approved.  Keep all follow-up visits as told by your health care provider. This is important. Contact a health care provider if:  You  have: ? A sore throat. ? A warm, red, or tender area on your skin. ? A cough. ? Frequent or painful urination. ? Vaginal discharge or itching.  You develop: ? Sores in your mouth or anus. ? Swollen lymph nodes. ? Red streaks on the skin. ? A rash. Get help right away if:  You have: ? A fever. ? Chills, or you start to shake.  You feel: ? Nauseous, or you vomit. ? Very fatigued. ? Short of breath. Summary  Neutropenia is a condition that occurs when you have a lower-than-normal level of a type of white blood cell (neutrophil) in your body.  This condition can occur if your body uses up or destroys neutrophils faster than your bone marrow can make them.  Treatment depends on the underlying cause and severity of your condition. Mild neutropenia may not require treatment.  Follow any precautions as told by your health care provider to reduce your risk for injury or infection. This information is not intended to replace advice given to you by your health care provider. Make sure you discuss any questions you have with your health care provider. Document Revised: 05/30/2018 Document Reviewed: 05/30/2018 Elsevier Patient Education  2021 [employer].

## 2020-09-22 NOTE — Progress Notes (Signed)
Reviewed pt labs with Dr. Marin Olp and due to [facility] 0.8; treatment to be held. Pt to receive IVF and return with next appt. Reviewed signs and symptoms of neutropenia with patient who verbalized understanding. Pt aware to increase hand washing and avoid crowds. Pt verbalized understanding and had no further questions.

## 2020-09-22 NOTE — Patient Instructions (Signed)

## 2020-09-27 ENCOUNTER — Encounter: Payer: Self-pay | Admitting: *Deleted

## 2020-09-27 NOTE — Progress Notes (Signed)
Received call from patient's daughter, [name]. Patient tested positive for COVID, with a home test, on 09/22/2020. This was followed by a positive test, taken at [facility], on 09/24/20. Patient feels mostly okay, but is experiencing a 'runny nose'.   Notified scheduling as her appointments will need to be pushed out to 10/12/20 per Covid guidelines.   Schedules adjusted upcoming appointments. Called and reviewed with [name].   Oncology Nurse Navigator Documentation  Oncology Nurse Navigator Flowsheets 09/27/2020  Abnormal Finding Date -  Planned Course of Treatment -  Phase of Treatment -  Chemotherapy Actual Start Date: -  Navigator Follow Up Date: 10/14/2020  Navigator Follow Up Reason: Follow-up Appointment;Chemotherapy  Navigator Location [facility]  Referral Date to RadOnc/MedOnc -  Navigator Encounter Type Telephone  Telephone Incoming Call;Patient Update  Treatment Initiated Date -  Patient Visit Type MedOnc  Treatment Phase Active Tx  Barriers/Navigation Needs Coordination of Care;Education  Education Other  Interventions Coordination of Care;Psycho-Social Support  Acuity Level 1-No Barriers  Referrals -  Coordination of Care Appts  Education Method Verbal  Support Groups/Services Friends and Family  Time Spent with Patient 30

## 2020-10-04 ENCOUNTER — Other Ambulatory Visit: Payer: Self-pay | Admitting: Hematology & Oncology

## 2020-10-07 ENCOUNTER — Ambulatory Visit: Payer: Medicare PPO | Admitting: Family

## 2020-10-07 ENCOUNTER — Other Ambulatory Visit: Payer: Medicare PPO

## 2020-10-07 ENCOUNTER — Ambulatory Visit: Payer: Medicare PPO

## 2020-10-14 ENCOUNTER — Inpatient Hospital Stay: Payer: Medicare PPO

## 2020-10-14 ENCOUNTER — Encounter: Payer: Self-pay | Admitting: *Deleted

## 2020-10-14 ENCOUNTER — Inpatient Hospital Stay: Payer: Medicare PPO | Admitting: Hematology & Oncology

## 2020-10-14 NOTE — Progress Notes (Signed)
Patient was late to today's appointment and it was rescheduled to next week.   Oncology Nurse Navigator Documentation  Oncology Nurse Navigator Flowsheets 10/14/2020  Abnormal Finding Date -  Planned Course of Treatment -  Phase of Treatment -  Chemotherapy Actual Start Date: -  Navigator Follow Up Date: 10/21/2020  Navigator Follow Up Reason: Follow-up Appointment  Navigator Location [facility]  Referral Date to RadOnc/MedOnc -  Navigator Encounter Type Appt/Treatment Plan Review  Telephone -  Treatment Initiated Date -  Patient Visit Type MedOnc  Treatment Phase Active Tx  Barriers/Navigation Needs Coordination of Care;Education  Education -  Interventions None Required  Acuity Level 1-No Barriers  Referrals -  Coordination of Care -  Education Method -  Support Groups/Services Friends and Family  Time Spent with Patient 15

## 2020-10-15 ENCOUNTER — Telehealth: Payer: Self-pay

## 2020-10-15 NOTE — Telephone Encounter (Signed)
appts set for 10/21/20 were late in the day and it was felt that chemo would run too late, appts r/s per staff message and a message was left with new appts      Vanessa Garrett

## 2020-10-21 ENCOUNTER — Other Ambulatory Visit: Payer: Medicare PPO

## 2020-10-21 ENCOUNTER — Ambulatory Visit: Payer: Medicare PPO | Admitting: Hematology & Oncology

## 2020-10-21 ENCOUNTER — Ambulatory Visit: Payer: Medicare PPO

## 2020-10-22 ENCOUNTER — Inpatient Hospital Stay: Payer: Medicare PPO

## 2020-10-22 ENCOUNTER — Inpatient Hospital Stay: Payer: Medicare PPO | Attending: Hematology & Oncology

## 2020-10-22 ENCOUNTER — Encounter: Payer: Self-pay | Admitting: Hematology & Oncology

## 2020-10-22 ENCOUNTER — Other Ambulatory Visit (HOSPITAL_COMMUNITY): Payer: Self-pay | Admitting: Hematology & Oncology

## 2020-10-22 ENCOUNTER — Encounter: Payer: Self-pay | Admitting: *Deleted

## 2020-10-22 ENCOUNTER — Inpatient Hospital Stay: Payer: Medicare PPO | Admitting: Hematology & Oncology

## 2020-10-22 ENCOUNTER — Other Ambulatory Visit: Payer: Self-pay

## 2020-10-22 VITALS — BP 136/67 | HR 66 | Temp 98.5°F | Resp 18 | Wt 151.0 lb

## 2020-10-22 DIAGNOSIS — Z5111 Encounter for antineoplastic chemotherapy: Secondary | ICD-10-CM | POA: Diagnosis present

## 2020-10-22 DIAGNOSIS — C23 Malignant neoplasm of gallbladder: Secondary | ICD-10-CM | POA: Diagnosis present

## 2020-10-22 DIAGNOSIS — R160 Hepatomegaly, not elsewhere classified: Secondary | ICD-10-CM

## 2020-10-22 DIAGNOSIS — C779 Secondary and unspecified malignant neoplasm of lymph node, unspecified: Secondary | ICD-10-CM | POA: Insufficient documentation

## 2020-10-22 DIAGNOSIS — K909 Intestinal malabsorption, unspecified: Secondary | ICD-10-CM

## 2020-10-22 DIAGNOSIS — D500 Iron deficiency anemia secondary to blood loss (chronic): Secondary | ICD-10-CM | POA: Insufficient documentation

## 2020-10-22 DIAGNOSIS — C787 Secondary malignant neoplasm of liver and intrahepatic bile duct: Secondary | ICD-10-CM | POA: Insufficient documentation

## 2020-10-22 DIAGNOSIS — Z79899 Other long term (current) drug therapy: Secondary | ICD-10-CM | POA: Diagnosis not present

## 2020-10-22 DIAGNOSIS — Z95828 Presence of other vascular implants and grafts: Secondary | ICD-10-CM

## 2020-10-22 LAB — CBC WITH DIFFERENTIAL (CANCER CENTER ONLY)
Abs Immature Granulocytes: 0.02 10*3/uL (ref 0.00–0.07)
Basophils Absolute: 0 10*3/uL (ref 0.0–0.1)
Basophils Relative: 1 %
Eosinophils Absolute: 0 10*3/uL (ref 0.0–0.5)
Eosinophils Relative: 1 %
HCT: 26.2 % — ABNORMAL LOW (ref 36.0–46.0)
Hemoglobin: 8.5 g/dL — ABNORMAL LOW (ref 12.0–15.0)
Immature Granulocytes: 0 %
Lymphocytes Relative: 11 %
Lymphs Abs: 0.7 10*3/uL (ref 0.7–4.0)
MCH: 30 pg (ref 26.0–34.0)
MCHC: 32.4 g/dL (ref 30.0–36.0)
MCV: 92.6 fL (ref 80.0–100.0)
Monocytes Absolute: 0.9 10*3/uL (ref 0.1–1.0)
Monocytes Relative: 13 %
Neutro Abs: 4.7 10*3/uL (ref 1.7–7.7)
Neutrophils Relative %: 74 %
Platelet Count: 204 10*3/uL (ref 150–400)
RBC: 2.83 MIL/uL — ABNORMAL LOW (ref 3.87–5.11)
RDW: 19.5 % — ABNORMAL HIGH (ref 11.5–15.5)
WBC Count: 6.4 10*3/uL (ref 4.0–10.5)
nRBC: 0 % (ref 0.0–0.2)

## 2020-10-22 LAB — CMP (CANCER CENTER ONLY)
ALT: 5 U/L (ref 0–44)
AST: 14 U/L — ABNORMAL LOW (ref 15–41)
Albumin: 3.9 g/dL (ref 3.5–5.0)
Alkaline Phosphatase: 98 U/L (ref 38–126)
Anion gap: 8 (ref 5–15)
BUN: 31 mg/dL — ABNORMAL HIGH (ref 8–23)
CO2: 31 mmol/L (ref 22–32)
Calcium: 10 mg/dL (ref 8.9–10.3)
Chloride: 96 mmol/L — ABNORMAL LOW (ref 98–111)
Creatinine: 1.68 mg/dL — ABNORMAL HIGH (ref 0.44–1.00)
GFR, Estimated: 31 mL/min — ABNORMAL LOW (ref >60)
Glucose, Bld: 123 mg/dL — ABNORMAL HIGH (ref 70–99)
Potassium: 3.5 mmol/L (ref 3.5–5.1)
Sodium: 135 mmol/L (ref 135–145)
Total Bilirubin: 0.4 mg/dL (ref 0.3–1.2)
Total Protein: 8.2 g/dL — ABNORMAL HIGH (ref 6.5–8.1)

## 2020-10-22 LAB — RETICULOCYTES
Immature Retic Fract: 14.3 % (ref 2.3–15.9)
RBC.: 2.79 MIL/uL — ABNORMAL LOW (ref 3.87–5.11)
Retic Count, Absolute: 54.1 10*3/uL (ref 19.0–186.0)
Retic Ct Pct: 1.9 % (ref 0.4–3.1)

## 2020-10-22 MED ORDER — SODIUM CHLORIDE 0.9 % IV SOLN
10.0000 mg | Freq: Once | INTRAVENOUS | Status: AC
  Administered 2020-10-22: 10 mg via INTRAVENOUS
  Filled 2020-10-22: qty 10

## 2020-10-22 MED ORDER — SODIUM CHLORIDE 0.9% FLUSH
10.0000 mL | Freq: Once | INTRAVENOUS | Status: AC
  Administered 2020-10-22: 10 mL via INTRAVENOUS
  Filled 2020-10-22: qty 10

## 2020-10-22 MED ORDER — SODIUM CHLORIDE 0.9 % IV SOLN
510.0000 mg | Freq: Once | INTRAVENOUS | Status: AC
  Administered 2020-10-22: 510 mg via INTRAVENOUS
  Filled 2020-10-22: qty 17

## 2020-10-22 MED ORDER — PALONOSETRON HCL INJECTION 0.25 MG/5ML
0.2500 mg | Freq: Once | INTRAVENOUS | Status: AC
  Administered 2020-10-22: 0.25 mg via INTRAVENOUS

## 2020-10-22 MED ORDER — SODIUM CHLORIDE 0.9 % IV SOLN
Freq: Once | INTRAVENOUS | Status: AC
  Filled 2020-10-22: qty 250

## 2020-10-22 MED ORDER — HEPARIN SOD (PORK) LOCK FLUSH 100 UNIT/ML IV SOLN
500.0000 [IU] | Freq: Once | INTRAVENOUS | Status: AC | PRN
  Administered 2020-10-22: 500 [IU]
  Filled 2020-10-22: qty 5

## 2020-10-22 MED ORDER — SODIUM CHLORIDE 0.9 % IV SOLN
150.0000 mg | Freq: Once | INTRAVENOUS | Status: AC
  Administered 2020-10-22: 150 mg via INTRAVENOUS
  Filled 2020-10-22: qty 150

## 2020-10-22 MED ORDER — SODIUM CHLORIDE 0.9 % IV SOLN
270.0000 mg | Freq: Once | INTRAVENOUS | Status: AC
Start: 2020-10-22 — End: 2020-10-22
  Administered 2020-10-22: 270 mg via INTRAVENOUS
  Filled 2020-10-22: qty 27

## 2020-10-22 MED ORDER — PALONOSETRON HCL INJECTION 0.25 MG/5ML
INTRAVENOUS | Status: AC
  Filled 2020-10-22: qty 5

## 2020-10-22 MED ORDER — GEMCITABINE HCL CHEMO INJECTION 1 GM/26.3ML
1400.0000 mg | Freq: Once | INTRAVENOUS | Status: AC
  Administered 2020-10-22: 1400 mg via INTRAVENOUS
  Filled 2020-10-22: qty 10.52

## 2020-10-22 MED ORDER — SODIUM CHLORIDE 0.9% FLUSH
10.0000 mL | INTRAVENOUS | Status: DC | PRN
  Administered 2020-10-22: 10 mL
  Filled 2020-10-22: qty 10

## 2020-10-22 MED ORDER — DRONABINOL 2.5 MG PO CAPS: 2.5000 mg | ORAL_CAPSULE | Freq: Two times a day (BID) | ORAL | 0 refills | Status: DC

## 2020-10-22 MED FILL — DRONABINOL 2.5 MG CAPSULE: 2.5 | 30 days supply | Qty: 60 | Fill #0

## 2020-10-22 NOTE — Progress Notes (Signed)
Dr. Marin Olp has reviewed CBC and CMET. Okay to treat with today's values.

## 2020-10-22 NOTE — Progress Notes (Signed)
Hematology and Oncology Follow Up Visit  Vanessa Garrett [id number] 1941/05/24 80 y.o. 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  It has been a while since we saw her.  Not sure exactly what was going on.  She seems to be doing pretty well.  She is quite anemic.  Unfortunately, because of government rules, we cannot give Feraheme with Retacrit.  This really is making life difficult for her.    She is not having pain which is nice to see.  Her appetite is still down.  She is not taking the Marinol.  I think she is having a tough time getting Marinol.  We will see if she can get the Marinol from our pharmacy downstairs.  Her last CA 19-9 was up.  When we last checked it in January it was 4600.  She clearly needs to have another MRI done.  This gives [place] a good idea as to how she is doing.  I believe that a reason trial seem to suggest that immunotherapy seem to help biliary tract cancer.  We will have to look into this.  She has had no diarrhea.  There has been no obvious bleeding per rectum.  She has had no headache.  This 1 leg swelling which I am sure is from her anemia.  Currently, I would say her performance status is ECOG 2.   Medications:  Allergies as of 10/22/2020   No Known Allergies     Medication List       Accurate as of October 22, 2020 11:40 AM. If you have any questions, ask your nurse or doctor.        dexamethasone 4 MG tablet Commonly known as: DECADRON Take 2 tablets (8 mg total) by mouth daily. Start the day after carboplatin chemotherapy for 3 days.    dronabinol 2.5 MG capsule Commonly known as: MARINOL Take 1 capsule (2.5 mg total) by mouth 2 (two) times daily before a meal. What changed: additional instructions   lidocaine-prilocaine cream Commonly known as: EMLA Apply to affected area once   metoprolol tartrate 25 MG tablet Commonly known as: LOPRESSOR TAKE 1 TABLET(25 MG) BY MOUTH TWICE DAILY   NON FORMULARY Take 2 tablets by mouth every other day. Mega [employer] - Blood Builder   NON FORMULARY 1.25 mg every other day. Beet Root Powder   ondansetron 8 MG tablet Commonly known as: Zofran Take 1 tablet (8 mg total) by mouth 2 (two) times daily as needed for refractory nausea / vomiting. Start on day 3 after carboplatin chemo.   potassium chloride SA 20 MEQ tablet Commonly known as: KLOR-CON Take 1 tablet (20 mEq total) by mouth daily.   prochlorperazine 10 MG tablet Commonly known as: COMPAZINE TAKE 1 TABLET(10 MG) BY MOUTH EVERY 6 HOURS AS NEEDED FOR NAUSEA OR VOMITING   S.S.S. TONIC PO Take 45 mLs by mouth every other day.   triamterene-hydrochlorothiazide 75-50 MG tablet Commonly known as: Maxzide Take 1 tablet by mouth daily.   vitamin B-12 1000 MCG tablet Commonly known as: CYANOCOBALAMIN Take 1,000 mcg by mouth daily.   VITAMIN D PO Take 2 tablets by mouth daily.  Allergies: No Known Allergies  Past Medical History, Surgical history, Social history, and Family History were reviewed and updated.  Review of Systems: Review of Systems  Constitutional: Positive for malaise/fatigue and weight loss.  HENT: Negative.   Eyes: Negative.   Respiratory: Negative.   Cardiovascular: Positive for leg swelling.  Gastrointestinal: Positive for nausea.  Genitourinary: Negative.   Musculoskeletal: Negative.   Skin: Negative.   Neurological: Negative.   Endo/Heme/Allergies: Negative.   Psychiatric/Behavioral: Negative.    Marland Kitchen   Physical Exam:  weight is 151 lb (68.5 kg). Her oral temperature is 98.5 F  (36.9 C). Her blood pressure is 136/67 and her pulse is 66. Her respiration is 18 and oxygen saturation is 100%.   Wt Readings from Last 3 Encounters:  10/22/20 151 lb (68.5 kg)  09/16/20 160 lb 1.3 oz (72.6 kg)  09/01/20 165 lb 1.3 oz (74.9 kg)    Physical Exam Vitals reviewed.  HENT:     Head: Normocephalic and atraumatic.     Mouth/Throat:     Mouth: Oropharynx is clear and moist.  Eyes:     Extraocular Movements: EOM normal.     Pupils: Pupils are equal, round, and reactive to light.  Cardiovascular:     Rate and Rhythm: Normal rate and regular rhythm.     Heart sounds: Normal heart sounds.  Pulmonary:     Effort: Pulmonary effort is normal.     Breath sounds: Normal breath sounds.  Abdominal:     General: Bowel sounds are normal.     Palpations: Abdomen is soft.  Musculoskeletal:        General: No tenderness, deformity or edema. Normal range of motion.     Cervical back: Normal range of motion.     Comments: Her extremities does show some swelling in the legs.  There might be a little bit more swelling in the right leg than in the left leg.  Lymphadenopathy:     Cervical: No cervical adenopathy.  Skin:    General: Skin is warm and dry.     Findings: No erythema or rash.  Neurological:     Mental Status: She is alert and oriented to person, place, and time.  Psychiatric:        Mood and Affect: Mood and affect normal.        Behavior: Behavior normal.        Thought Content: Thought content normal.        Judgment: Judgment normal.      Lab Results  Component Value Date   WBC 6.4 10/22/2020   HGB 8.5 (L) 10/22/2020   HCT 26.2 (L) 10/22/2020   MCV 92.6 10/22/2020   PLT 204 10/22/2020   Lab Results  Component Value Date   FERRITIN 1,539 (H) 09/16/2020   IRON 51 09/16/2020   TIBC 265 09/16/2020   UIBC 214 09/16/2020   IRONPCTSAT 19 (L) 09/16/2020   Lab Results  Component Value Date   RETICCTPCT 1.9 10/22/2020   RBC 2.83 (L) 10/22/2020   RBC 2.79  (L) 10/22/2020   No results found for: KPAFRELGTCHN, LAMBDASER, KAPLAMBRATIO No results found for: IGGSERUM, IGA, IGMSERUM No results found for: TOTALPROTELP, ALBUMINELP, A1GS, A2GS, BETS, BETA2SER, GAMS, MSPIKE, SPEI   Chemistry      Component Value Date/Time   NA 135 10/22/2020 1020   K 3.5 10/22/2020 1020   CL 96 (L) 10/22/2020 1020   CO2 31 10/22/2020 1020   BUN 31 (H) 10/22/2020 1020  CREATININE 1.68 (H) 10/22/2020 1020      Component Value Date/Time   CALCIUM 10.0 10/22/2020 1020   ALKPHOS 98 10/22/2020 1020   AST 14 (L) 10/22/2020 1020   ALT 5 10/22/2020 1020   BILITOT 0.4 10/22/2020 1020       Impression and Plan: Ms. Benge is a very pleasant 80 yo African American female with stage IV adenocarcinoma of the gallbladder.   She is on chemotherapy.  Again, it is hard to say if chemotherapy is helping [place].  We will go ahead and treat her today and then we will see how the MRI looks.  The CA 19-9 definitely will help [place] out.  Again we are focused on her quality of life.  The quality of life is clearly dictated by her anemia.  She will get iron today.  I really wish we can give her iron and Retacrit together.  Unfortunately we cannot.  She will get iron today and then she will get Retacrit next week.  We will have to alternate the treatments.  If she does have progressive disease, our option might be FOLFOX.  I will also look into the possibility of immunotherapy.  For right now, I am just happy that her quality of life seems to be doing fairly well.  I would like to plan to see her back in a month.  By then, we will know where things stand with her cancer and hopefully her anemia will be better.   Volanda Napoleon, MD 2/25/202211:40 AM

## 2020-10-22 NOTE — Progress Notes (Signed)
Patient has lost weight, will adjust the doses of chemotherapy to reflect the patient's current weight per Dr. Antonieta Pert instructions.

## 2020-10-22 NOTE — Patient Instructions (Signed)
Tunneled Central Venous Catheter Flushing Guide  It is important to flush your tunneled central venous catheter each time you use it, both before and after you use it. Flushing your catheter will help prevent it from clogging. What are the risks? Risks may include:  Infection.  Air getting into the catheter and bloodstream. Supplies needed:  A clean pair of gloves.  A disinfecting wipe. Use an alcohol wipe, chlorhexidine wipe, or iodine wipe as told by your health care provider.  A 10 mL syringe that has been prefilled with saline solution.  An empty 10 mL syringe, if a substance called heparin was injected into your catheter. How to flush your catheter When you flush your catheter, make sure you follow any specific instructions from your health care provider or the manufacturer. These are general guidelines. Flushing your catheter before use If there is heparin in your catheter: 1. Wash your hands with soap and water. 2. Put on gloves. 3. Scrub the injection cap for a minimum of 15 seconds with a disinfecting wipe. 4. Unclamp the catheter. 5. Attach the empty syringe to the injection cap. 6. Pull the syringe plunger back and withdraw 10 mL of blood. 7. Place the syringe into an appropriate waste container. 8. Scrub the injection cap for 15 seconds with a disinfecting wipe. 9. Attach the prefilled syringe to the injection cap. 10. Flush the catheter by pushing the plunger forward until all the liquid from the syringe is in the catheter. 11. Remove the syringe from the injection cap. 12. Clamp the catheter. If there is no heparin in your catheter: 1. Wash your hands with soap and water. 2. Put on gloves. 3. Scrub the injection cap for 15 seconds with a disinfecting wipe. 4. Unclamp the catheter. 5. Attach the prefilled syringe to the injection cap. 6. Flush the catheter by pushing the plunger forward until 5 mL of the liquid from the syringe is in the catheter. 7. Pull back on  the syringe until you see blood in the catheter. 8. If you have been asked to collect any blood, follow your health care provider's instructions. Otherwise, flush the catheter with the rest of the solution from the syringe. 9. Remove the syringe from the injection cap. 10. Clamp the catheter.   Flushing your catheter after use 1. Wash your hands with soap and water. 2. Put on gloves. 3. Scrub the injection cap for 15 seconds with a disinfecting wipe. 4. Unclamp the catheter. 5. Attach the prefilled syringe to the injection cap. 6. Flush the catheter by pushing the plunger forward until all of the liquid from the syringe is in the catheter. 7. Remove the syringe from the injection cap. 8. Clamp the catheter. Problems and solutions  If blood cannot be completely cleared from the injection cap, you may need to have the injection cap replaced.  If the catheter is difficult to flush, use the pulsing method. The pulsing method involves pushing only a few milliliters of solution into the catheter at a time and pausing between pushes.  If you do not see blood in the catheter when you pull back on the syringe, change your body position, such as by raising your arms above your head. Take a deep breath and cough. Then, pull back on the syringe. If you still do not see blood, flush the catheter with a small amount of solution. Then, change positions again and take a breath or cough. Pull back on the syringe again. If you still do not   see blood, finish flushing the catheter and contact your health care provider. Do not use your catheter until your health care provider says it is okay. General tips  Have someone help you flush your catheter, if possible.  Do not force fluid through your catheter.  Do not use a syringe that is larger or smaller than 10 mL. Using a smaller syringe can make the catheter burst.  Do not use your catheter without flushing it first if it has heparin in it. Contact a health  care provider if:  You cannot see any blood in the catheter when you flush it before using it.  Your catheter is difficult to flush. Get help right away if:  You cannot flush the catheter.  The catheter leaks when you flush it or when there is fluid in it.  There are cracks or breaks in the catheter. Summary  It is important to flush your tunneled central venous catheter each time you use it, both before and after you use it.  Scrub the injection cap for 15 seconds with a disinfecting wipe before and after you flush it.  When you flush your catheter, make sure you follow any specific instructions from your health care provider or the manufacturer.  Get help right away if you cannot flush the catheter. This information is not intended to replace advice given to you by your health care provider. Make sure you discuss any questions you have with your health care provider. Document Revised: 10/23/2019 Document Reviewed: 10/30/2018 Elsevier Patient Education  2021 [employer] Inc.  

## 2020-10-22 NOTE — Progress Notes (Signed)
Received a call from patient's daughter, [name], requesting that Dr Marin Olp give her a call today. Message given to Dr Marin Olp.   Patient will need an MRI. Will wait for PA and then follow for scheduling.   Oncology Nurse Navigator Documentation  Oncology Nurse Navigator Flowsheets 10/22/2020  Abnormal Finding Date -  Planned Course of Treatment -  Phase of Treatment -  Chemotherapy Actual Start Date: -  Navigator Follow Up Date: 10/28/2020  Navigator Follow Up Reason: Follow-up Appointment  Navigator Location [facility]  Referral Date to RadOnc/MedOnc -  Navigator Encounter Type Telephone;Treatment  Telephone Incoming Call;Patient Update  Treatment Initiated Date -  Patient Visit Type MedOnc  Treatment Phase Active Tx  Barriers/Navigation Needs Coordination of Care;Education  Education Other  Interventions Education;Psycho-Social Support  Acuity Level 1-No Barriers  Referrals -  Coordination of Care Other  Education Method Other  Support Groups/Services Friends and Family  Time Spent with Patient 30

## 2020-10-22 NOTE — Patient Instructions (Signed)
Ferumoxytol injection What is this medicine? FERUMOXYTOL is an iron complex. Iron is used to make healthy red blood cells, which carry oxygen and nutrients throughout the body. This medicine is used to treat iron deficiency anemia. This medicine may be used for other purposes; ask your health care provider or pharmacist if you have questions. COMMON BRAND NAME(S): Feraheme What should I tell my health care provider before I take this medicine? They need to know if you have any of these conditions:  anemia not caused by low iron levels  high levels of iron in the blood  magnetic resonance imaging (MRI) test scheduled  an unusual or allergic reaction to iron, other medicines, foods, dyes, or preservatives  pregnant or trying to get pregnant  breast-feeding How should I use this medicine? This medicine is for injection into a vein. It is given by a health care professional in a hospital or clinic setting. Talk to your pediatrician regarding the use of this medicine in children. Special care may be needed. Overdosage: If you think you have taken too much of this medicine contact a poison control center or emergency room at once. NOTE: This medicine is only for you. Do not share this medicine with others. What if I miss a dose? It is important not to miss your dose. Call your doctor or health care professional if you are unable to keep an appointment. What may interact with this medicine? This medicine may interact with the following medications:  other iron products This list may not describe all possible interactions. Give your health care provider a list of all the medicines, herbs, non-prescription drugs, or dietary supplements you use. Also tell them if you smoke, drink alcohol, or use illegal drugs. Some items may interact with your medicine. What should I watch for while using this medicine? Visit your doctor or healthcare professional regularly. Tell your doctor or healthcare  professional if your symptoms do not start to get better or if they get worse. You may need blood work done while you are taking this medicine. You may need to follow a special diet. Talk to your doctor. Foods that contain iron include: whole grains/cereals, dried fruits, beans, or peas, leafy green vegetables, and organ meats (liver, kidney). What side effects may I notice from receiving this medicine? Side effects that you should report to your doctor or health care professional as soon as possible:  allergic reactions like skin rash, itching or hives, swelling of the face, lips, or tongue  breathing problems  changes in blood pressure  feeling faint or lightheaded, falls  fever or chills  flushing, sweating, or hot feelings  swelling of the ankles or feet Side effects that usually do not require medical attention (report to your doctor or health care professional if they continue or are bothersome):  diarrhea  headache  nausea, vomiting  stomach pain This list may not describe all possible side effects. Call your doctor for medical advice about side effects. You may report side effects to FDA at 1-800-FDA-1088. Where should I keep my medicine? This drug is given in a hospital or clinic and will not be stored at home. NOTE: This sheet is a summary. It may not cover all possible information. If you have questions about this medicine, talk to your doctor, pharmacist, or health care provider.  2021 Elsevier/Gold Standard (2016-10-02 20:21:10) Gemcitabine injection What is this medicine? GEMCITABINE (jem SYE ta been) is a chemotherapy drug. This medicine is used to treat many types of  cancer like breast cancer, lung cancer, pancreatic cancer, and ovarian cancer. This medicine may be used for other purposes; ask your health care provider or pharmacist if you have questions. COMMON BRAND NAME(S): Gemzar, Infugem What should I tell my health care provider before I take this  medicine? They need to know if you have any of these conditions:  blood disorders  infection  kidney disease  liver disease  lung or breathing disease, like asthma  recent or ongoing radiation therapy  an unusual or allergic reaction to gemcitabine, other chemotherapy, other medicines, foods, dyes, or preservatives  pregnant or trying to get pregnant  breast-feeding How should I use this medicine? This drug is given as an infusion into a vein. It is administered in a hospital or clinic by a specially trained health care professional. Talk to your pediatrician regarding the use of this medicine in children. Special care may be needed. Overdosage: If you think you have taken too much of this medicine contact a poison control center or emergency room at once. NOTE: This medicine is only for you. Do not share this medicine with others. What if I miss a dose? It is important not to miss your dose. Call your doctor or health care professional if you are unable to keep an appointment. What may interact with this medicine?  medicines to increase blood counts like filgrastim, pegfilgrastim, sargramostim  some other chemotherapy drugs like cisplatin  vaccines Talk to your doctor or health care professional before taking any of these medicines:  acetaminophen  aspirin  ibuprofen  ketoprofen  naproxen This list may not describe all possible interactions. Give your health care provider a list of all the medicines, herbs, non-prescription drugs, or dietary supplements you use. Also tell them if you smoke, drink alcohol, or use illegal drugs. Some items may interact with your medicine. What should I watch for while using this medicine? Visit your doctor for checks on your progress. This drug may make you feel generally unwell. This is not uncommon, as chemotherapy can affect healthy cells as well as cancer cells. Report any side effects. Continue your course of treatment even though  you feel ill unless your doctor tells you to stop. In some cases, you may be given additional medicines to help with side effects. Follow all directions for their use. Call your doctor or health care professional for advice if you get a fever, chills or sore throat, or other symptoms of a cold or flu. Do not treat yourself. This drug decreases your body's ability to fight infections. Try to avoid being around people who are sick. This medicine may increase your risk to bruise or bleed. Call your doctor or health care professional if you notice any unusual bleeding. Be careful brushing and flossing your teeth or using a toothpick because you may get an infection or bleed more easily. If you have any dental work done, tell your dentist you are receiving this medicine. Avoid taking products that contain aspirin, acetaminophen, ibuprofen, naproxen, or ketoprofen unless instructed by your doctor. These medicines may hide a fever. Do not become pregnant while taking this medicine or for 6 months after stopping it. Women should inform their doctor if they wish to become pregnant or think they might be pregnant. Men should not father a child while taking this medicine and for 3 months after stopping it. There is a potential for serious side effects to an unborn child. Talk to your health care professional or pharmacist for more information.  Do not breast-feed an infant while taking this medicine or for at least 1 week after stopping it. Men should inform their doctors if they wish to father a child. This medicine may lower sperm counts. Talk with your doctor or health care professional if you are concerned about your fertility. What side effects may I notice from receiving this medicine? Side effects that you should report to your doctor or health care professional as soon as possible:  allergic reactions like skin rash, itching or hives, swelling of the face, lips, or tongue  breathing problems  pain,  redness, or irritation at site where injected  signs and symptoms of a dangerous change in heartbeat or heart rhythm like chest pain; dizziness; fast or irregular heartbeat; palpitations; feeling faint or lightheaded, falls; breathing problems  signs of decreased platelets or bleeding - bruising, pinpoint red spots on the skin, black, tarry stools, blood in the urine  signs of decreased red blood cells - unusually weak or tired, feeling faint or lightheaded, falls  signs of infection - fever or chills, cough, sore throat, pain or difficulty passing urine  signs and symptoms of kidney injury like trouble passing urine or change in the amount of urine  signs and symptoms of liver injury like dark yellow or brown urine; general ill feeling or flu-like symptoms; light-colored stools; loss of appetite; nausea; right upper belly pain; unusually weak or tired; yellowing of the eyes or skin  swelling of ankles, feet, hands Side effects that usually do not require medical attention (report to your doctor or health care professional if they continue or are bothersome):  constipation  diarrhea  hair loss  loss of appetite  nausea  rash  vomiting This list may not describe all possible side effects. Call your doctor for medical advice about side effects. You may report side effects to FDA at 1-800-FDA-1088. Where should I keep my medicine? This drug is given in a hospital or clinic and will not be stored at home. NOTE: This sheet is a summary. It may not cover all possible information. If you have questions about this medicine, talk to your doctor, pharmacist, or health care provider.  2021 Elsevier/Gold Standard (2017-11-07 18:06:11) Carboplatin injection What is this medicine? CARBOPLATIN (KAR boe pla tin) is a chemotherapy drug. It targets fast dividing cells, like cancer cells, and causes these cells to die. This medicine is used to treat ovarian cancer and many other cancers. This  medicine may be used for other purposes; ask your health care provider or pharmacist if you have questions. COMMON BRAND NAME(S): Paraplatin What should I tell my health care provider before I take this medicine? They need to know if you have any of these conditions:  blood disorders  hearing problems  kidney disease  recent or ongoing radiation therapy  an unusual or allergic reaction to carboplatin, cisplatin, other chemotherapy, other medicines, foods, dyes, or preservatives  pregnant or trying to get pregnant  breast-feeding How should I use this medicine? This drug is usually given as an infusion into a vein. It is administered in a hospital or clinic by a specially trained health care professional. Talk to your pediatrician regarding the use of this medicine in children. Special care may be needed. Overdosage: If you think you have taken too much of this medicine contact a poison control center or emergency room at once. NOTE: This medicine is only for you. Do not share this medicine with others. What if I miss a dose? It  is important not to miss a dose. Call your doctor or health care professional if you are unable to keep an appointment. What may interact with this medicine?  medicines for seizures  medicines to increase blood counts like filgrastim, pegfilgrastim, sargramostim  some antibiotics like amikacin, gentamicin, neomycin, streptomycin, tobramycin  vaccines Talk to your doctor or health care professional before taking any of these medicines:  acetaminophen  aspirin  ibuprofen  ketoprofen  naproxen This list may not describe all possible interactions. Give your health care provider a list of all the medicines, herbs, non-prescription drugs, or dietary supplements you use. Also tell them if you smoke, drink alcohol, or use illegal drugs. Some items may interact with your medicine. What should I watch for while using this medicine? Your condition will be  monitored carefully while you are receiving this medicine. You will need important blood work done while you are taking this medicine. This drug may make you feel generally unwell. This is not uncommon, as chemotherapy can affect healthy cells as well as cancer cells. Report any side effects. Continue your course of treatment even though you feel ill unless your doctor tells you to stop. In some cases, you may be given additional medicines to help with side effects. Follow all directions for their use. Call your doctor or health care professional for advice if you get a fever, chills or sore throat, or other symptoms of a cold or flu. Do not treat yourself. This drug decreases your body's ability to fight infections. Try to avoid being around people who are sick. This medicine may increase your risk to bruise or bleed. Call your doctor or health care professional if you notice any unusual bleeding. Be careful brushing and flossing your teeth or using a toothpick because you may get an infection or bleed more easily. If you have any dental work done, tell your dentist you are receiving this medicine. Avoid taking products that contain aspirin, acetaminophen, ibuprofen, naproxen, or ketoprofen unless instructed by your doctor. These medicines may hide a fever. Do not become pregnant while taking this medicine. Women should inform their doctor if they wish to become pregnant or think they might be pregnant. There is a potential for serious side effects to an unborn child. Talk to your health care professional or pharmacist for more information. Do not breast-feed an infant while taking this medicine. What side effects may I notice from receiving this medicine? Side effects that you should report to your doctor or health care professional as soon as possible:  allergic reactions like skin rash, itching or hives, swelling of the face, lips, or tongue  signs of infection - fever or chills, cough, sore throat,  pain or difficulty passing urine  signs of decreased platelets or bleeding - bruising, pinpoint red spots on the skin, black, tarry stools, nosebleeds  signs of decreased red blood cells - unusually weak or tired, fainting spells, lightheadedness  breathing problems  changes in hearing  changes in vision  chest pain  high blood pressure  low blood counts - This drug may decrease the number of white blood cells, red blood cells and platelets. You may be at increased risk for infections and bleeding.  nausea and vomiting  pain, swelling, redness or irritation at the injection site  pain, tingling, numbness in the hands or feet  problems with balance, talking, walking  trouble passing urine or change in the amount of urine Side effects that usually do not require medical attention (report  to your doctor or health care professional if they continue or are bothersome):  hair loss  loss of appetite  metallic taste in the mouth or changes in taste This list may not describe all possible side effects. Call your doctor for medical advice about side effects. You may report side effects to FDA at 1-800-FDA-1088. Where should I keep my medicine? This drug is given in a hospital or clinic and will not be stored at home. NOTE: This sheet is a summary. It may not cover all possible information. If you have questions about this medicine, talk to your doctor, pharmacist, or health care provider.  2021 Elsevier/Gold Standard (2007-11-19 14:38:05)

## 2020-10-23 LAB — CANCER ANTIGEN 19-9: CA 19-9: 2288 U/mL — ABNORMAL HIGH (ref 0–35)

## 2020-10-25 ENCOUNTER — Telehealth: Payer: Self-pay

## 2020-10-25 LAB — IRON AND TIBC
Iron: 43 ug/dL (ref 41–142)
Saturation Ratios: 17 % — ABNORMAL LOW (ref 21–57)
TIBC: 248 ug/dL (ref 236–444)
UIBC: 204 ug/dL (ref 120–384)

## 2020-10-25 LAB — FERRITIN: Ferritin: 1087 ng/mL — ABNORMAL HIGH (ref 11–307)

## 2020-10-25 NOTE — Telephone Encounter (Signed)
10/22/20 los appts made and pt will gain sch at 10/29/19 appts already in place    Vanessa Garrett

## 2020-10-28 ENCOUNTER — Other Ambulatory Visit: Payer: Self-pay

## 2020-10-28 ENCOUNTER — Inpatient Hospital Stay (HOSPITAL_BASED_OUTPATIENT_CLINIC_OR_DEPARTMENT_OTHER): Payer: Medicare PPO | Admitting: Hematology & Oncology

## 2020-10-28 ENCOUNTER — Inpatient Hospital Stay: Payer: Medicare PPO | Attending: Hematology & Oncology

## 2020-10-28 ENCOUNTER — Encounter: Payer: Self-pay | Admitting: Hematology & Oncology

## 2020-10-28 ENCOUNTER — Encounter: Payer: Self-pay | Admitting: *Deleted

## 2020-10-28 ENCOUNTER — Inpatient Hospital Stay: Payer: Medicare PPO

## 2020-10-28 ENCOUNTER — Telehealth: Payer: Self-pay

## 2020-10-28 VITALS — BP 136/64 | HR 69 | Temp 98.6°F | Resp 18 | Ht 61.0 in | Wt 154.4 lb

## 2020-10-28 DIAGNOSIS — D500 Iron deficiency anemia secondary to blood loss (chronic): Secondary | ICD-10-CM

## 2020-10-28 DIAGNOSIS — Z5111 Encounter for antineoplastic chemotherapy: Secondary | ICD-10-CM | POA: Diagnosis present

## 2020-10-28 DIAGNOSIS — C23 Malignant neoplasm of gallbladder: Secondary | ICD-10-CM | POA: Insufficient documentation

## 2020-10-28 DIAGNOSIS — C787 Secondary malignant neoplasm of liver and intrahepatic bile duct: Secondary | ICD-10-CM | POA: Diagnosis present

## 2020-10-28 DIAGNOSIS — D649 Anemia, unspecified: Secondary | ICD-10-CM | POA: Insufficient documentation

## 2020-10-28 DIAGNOSIS — C779 Secondary and unspecified malignant neoplasm of lymph node, unspecified: Secondary | ICD-10-CM | POA: Insufficient documentation

## 2020-10-28 DIAGNOSIS — Z79899 Other long term (current) drug therapy: Secondary | ICD-10-CM | POA: Diagnosis not present

## 2020-10-28 DIAGNOSIS — Z95828 Presence of other vascular implants and grafts: Secondary | ICD-10-CM

## 2020-10-28 DIAGNOSIS — K909 Intestinal malabsorption, unspecified: Secondary | ICD-10-CM

## 2020-10-28 DIAGNOSIS — R160 Hepatomegaly, not elsewhere classified: Secondary | ICD-10-CM

## 2020-10-28 LAB — CBC WITH DIFFERENTIAL (CANCER CENTER ONLY)
Abs Immature Granulocytes: 0.01 10*3/uL (ref 0.00–0.07)
Basophils Absolute: 0 10*3/uL (ref 0.0–0.1)
Basophils Relative: 1 %
Eosinophils Absolute: 0 10*3/uL (ref 0.0–0.5)
Eosinophils Relative: 1 %
HCT: 23 % — ABNORMAL LOW (ref 36.0–46.0)
Hemoglobin: 7.5 g/dL — ABNORMAL LOW (ref 12.0–15.0)
Immature Granulocytes: 0 %
Lymphocytes Relative: 9 %
Lymphs Abs: 0.3 10*3/uL — ABNORMAL LOW (ref 0.7–4.0)
MCH: 30.7 pg (ref 26.0–34.0)
MCHC: 32.6 g/dL (ref 30.0–36.0)
MCV: 94.3 fL (ref 80.0–100.0)
Monocytes Absolute: 0.1 10*3/uL (ref 0.1–1.0)
Monocytes Relative: 2 %
Neutro Abs: 3.1 10*3/uL (ref 1.7–7.7)
Neutrophils Relative %: 87 %
Platelet Count: 148 10*3/uL — ABNORMAL LOW (ref 150–400)
RBC: 2.44 MIL/uL — ABNORMAL LOW (ref 3.87–5.11)
RDW: 17.2 % — ABNORMAL HIGH (ref 11.5–15.5)
WBC Count: 3.6 10*3/uL — ABNORMAL LOW (ref 4.0–10.5)
nRBC: 0 % (ref 0.0–0.2)

## 2020-10-28 LAB — CMP (CANCER CENTER ONLY)
ALT: 19 U/L (ref 0–44)
AST: 23 U/L (ref 15–41)
Albumin: 3.8 g/dL (ref 3.5–5.0)
Alkaline Phosphatase: 114 U/L (ref 38–126)
Anion gap: 8 (ref 5–15)
BUN: 46 mg/dL — ABNORMAL HIGH (ref 8–23)
CO2: 27 mmol/L (ref 22–32)
Calcium: 9.6 mg/dL (ref 8.9–10.3)
Chloride: 98 mmol/L (ref 98–111)
Creatinine: 1.75 mg/dL — ABNORMAL HIGH (ref 0.44–1.00)
GFR, Estimated: 29 mL/min — ABNORMAL LOW (ref >60)
Glucose, Bld: 128 mg/dL — ABNORMAL HIGH (ref 70–99)
Potassium: 4 mmol/L (ref 3.5–5.1)
Sodium: 133 mmol/L — ABNORMAL LOW (ref 135–145)
Total Bilirubin: 0.4 mg/dL (ref 0.3–1.2)
Total Protein: 7.4 g/dL (ref 6.5–8.1)

## 2020-10-28 LAB — RETICULOCYTES
RBC.: 2.37 MIL/uL — ABNORMAL LOW (ref 3.87–5.11)
Retic Ct Pct: <0.4 % — ABNORMAL LOW (ref 0.4–3.1)

## 2020-10-28 MED ORDER — PROCHLORPERAZINE MALEATE 10 MG PO TABS
ORAL_TABLET | ORAL | Status: AC
  Filled 2020-10-28: qty 1

## 2020-10-28 MED ORDER — HEPARIN SOD (PORK) LOCK FLUSH 100 UNIT/ML IV SOLN
250.0000 [IU] | Freq: Once | INTRAVENOUS | Status: AC | PRN
  Administered 2020-10-28: 500 [IU]
  Filled 2020-10-28: qty 5

## 2020-10-28 MED ORDER — SODIUM CHLORIDE 0.9% FLUSH
10.0000 mL | Freq: Once | INTRAVENOUS | Status: AC
  Administered 2020-10-28: 10 mL via INTRAVENOUS
  Filled 2020-10-28: qty 10

## 2020-10-28 MED ORDER — EPOETIN ALFA-EPBX 40000 UNIT/ML IJ SOLN
40000.0000 [IU] | Freq: Once | INTRAMUSCULAR | Status: AC
  Administered 2020-10-28: 40000 [IU] via SUBCUTANEOUS

## 2020-10-28 MED ORDER — SODIUM CHLORIDE 0.9 % IV SOLN
Freq: Once | INTRAVENOUS | Status: AC
  Filled 2020-10-28: qty 250

## 2020-10-28 MED ORDER — SODIUM CHLORIDE 0.9% FLUSH
3.0000 mL | Freq: Once | INTRAVENOUS | Status: AC | PRN
  Administered 2020-10-28: 10 mL
  Filled 2020-10-28: qty 10

## 2020-10-28 MED ORDER — EPOETIN ALFA-EPBX 40000 UNIT/ML IJ SOLN: 40000.0000 [IU] | Freq: Once | INTRAMUSCULAR | Status: DC

## 2020-10-28 MED ORDER — PROCHLORPERAZINE MALEATE 10 MG PO TABS
10.0000 mg | ORAL_TABLET | Freq: Once | ORAL | Status: AC
  Administered 2020-10-28: 10 mg via ORAL

## 2020-10-28 MED ORDER — SODIUM CHLORIDE 0.9 % IV SOLN
1400.0000 mg | Freq: Once | INTRAVENOUS | Status: AC
  Administered 2020-10-28: 1400 mg via INTRAVENOUS
  Filled 2020-10-28: qty 26.3

## 2020-10-28 MED ORDER — EPOETIN ALFA-EPBX 40000 UNIT/ML IJ SOLN
INTRAMUSCULAR | Status: AC
  Filled 2020-10-28: qty 1

## 2020-10-28 NOTE — Patient Instructions (Signed)
Gemcitabine injection What is this medicine? GEMCITABINE (jem SYE ta been) is a chemotherapy drug. This medicine is used to treat many types of cancer like breast cancer, lung cancer, pancreatic cancer, and ovarian cancer. This medicine may be used for other purposes; ask your health care provider or pharmacist if you have questions. COMMON BRAND NAME(S): Gemzar, Infugem What should I tell my health care provider before I take this medicine? They need to know if you have any of these conditions:  blood disorders  infection  kidney disease  liver disease  lung or breathing disease, like asthma  recent or ongoing radiation therapy  an unusual or allergic reaction to gemcitabine, other chemotherapy, other medicines, foods, dyes, or preservatives  pregnant or trying to get pregnant  breast-feeding How should I use this medicine? This drug is given as an infusion into a vein. It is administered in a hospital or clinic by a specially trained health care professional. Talk to your pediatrician regarding the use of this medicine in children. Special care may be needed. Overdosage: If you think you have taken too much of this medicine contact a poison control center or emergency room at once. NOTE: This medicine is only for you. Do not share this medicine with others. What if I miss a dose? It is important not to miss your dose. Call your doctor or health care professional if you are unable to keep an appointment. What may interact with this medicine?  medicines to increase blood counts like filgrastim, pegfilgrastim, sargramostim  some other chemotherapy drugs like cisplatin  vaccines Talk to your doctor or health care professional before taking any of these medicines:  acetaminophen  aspirin  ibuprofen  ketoprofen  naproxen This list may not describe all possible interactions. Give your health care provider a list of all the medicines, herbs, non-prescription drugs, or  dietary supplements you use. Also tell them if you smoke, drink alcohol, or use illegal drugs. Some items may interact with your medicine. What should I watch for while using this medicine? Visit your doctor for checks on your progress. This drug may make you feel generally unwell. This is not uncommon, as chemotherapy can affect healthy cells as well as cancer cells. Report any side effects. Continue your course of treatment even though you feel ill unless your doctor tells you to stop. In some cases, you may be given additional medicines to help with side effects. Follow all directions for their use. Call your doctor or health care professional for advice if you get a fever, chills or sore throat, or other symptoms of a cold or flu. Do not treat yourself. This drug decreases your body's ability to fight infections. Try to avoid being around people who are sick. This medicine may increase your risk to bruise or bleed. Call your doctor or health care professional if you notice any unusual bleeding. Be careful brushing and flossing your teeth or using a toothpick because you may get an infection or bleed more easily. If you have any dental work done, tell your dentist you are receiving this medicine. Avoid taking products that contain aspirin, acetaminophen, ibuprofen, naproxen, or ketoprofen unless instructed by your doctor. These medicines may hide a fever. Do not become pregnant while taking this medicine or for 6 months after stopping it. Women should inform their doctor if they wish to become pregnant or think they might be pregnant. Men should not father a child while taking this medicine and for 3 months after stopping it.   There is a potential for serious side effects to an unborn child. Talk to your health care professional or pharmacist for more information. Do not breast-feed an infant while taking this medicine or for at least 1 week after stopping it. Men should inform their doctors if they wish  to father a child. This medicine may lower sperm counts. Talk with your doctor or health care professional if you are concerned about your fertility. What side effects may I notice from receiving this medicine? Side effects that you should report to your doctor or health care professional as soon as possible:  allergic reactions like skin rash, itching or hives, swelling of the face, lips, or tongue  breathing problems  pain, redness, or irritation at site where injected  signs and symptoms of a dangerous change in heartbeat or heart rhythm like chest pain; dizziness; fast or irregular heartbeat; palpitations; feeling faint or lightheaded, falls; breathing problems  signs of decreased platelets or bleeding - bruising, pinpoint red spots on the skin, black, tarry stools, blood in the urine  signs of decreased red blood cells - unusually weak or tired, feeling faint or lightheaded, falls  signs of infection - fever or chills, cough, sore throat, pain or difficulty passing urine  signs and symptoms of kidney injury like trouble passing urine or change in the amount of urine  signs and symptoms of liver injury like dark yellow or brown urine; general ill feeling or flu-like symptoms; light-colored stools; loss of appetite; nausea; right upper belly pain; unusually weak or tired; yellowing of the eyes or skin  swelling of ankles, feet, hands Side effects that usually do not require medical attention (report to your doctor or health care professional if they continue or are bothersome):  constipation  diarrhea  hair loss  loss of appetite  nausea  rash  vomiting This list may not describe all possible side effects. Call your doctor for medical advice about side effects. You may report side effects to FDA at 1-800-FDA-1088. Where should I keep my medicine? This drug is given in a hospital or clinic and will not be stored at home. NOTE: This sheet is a summary. It may not cover all  possible information. If you have questions about this medicine, talk to your doctor, pharmacist, or health care provider.  2021 Elsevier/Gold Standard (2017-11-07 18:06:11)  

## 2020-10-28 NOTE — Progress Notes (Signed)
Hematology and Oncology Follow Up Visit  Vanessa Garrett [id number] [birth year] 80 y.o. 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 is here today for follow-up and treatment.  It has been a while since we saw her.  Not sure exactly what was going on.  She seems to be doing pretty well.  She is quite anemic.  Unfortunately, because of government rules, we cannot give Feraheme with Retacrit.  This really is making life difficult for her.    She is not having pain which is nice to see.  Her appetite is still down.  She is not taking the Marinol.  I think she is having a tough time getting Marinol.  We will see if she can get the Marinol from our pharmacy downstairs.  Her last CA 19-9 was up.  When we last checked it in January it was 4600.  She clearly needs to have another MRI done.  This gives Vanessa Garrett a good idea as to how she is doing.  I believe that a reason trial seem to suggest that immunotherapy seem to help biliary tract cancer.  We will have to look into this.  She has had no diarrhea.  There has been no obvious bleeding per rectum.  She has had no headache.  This 1 leg swelling which I am sure is from her anemia.  Currently, I would say her performance status is ECOG 2.   Medications:  Allergies as of 10/28/2020   No Known Allergies     Medication List       Accurate as of October 28, 2020 12:27 PM. If you have any questions, ask your nurse or doctor.        dexamethasone 4 MG tablet Commonly known as: DECADRON Take 2 tablets (8 mg total) by mouth daily. Start the day after carboplatin chemotherapy for 3 days.   dronabinol  2.5 MG capsule Commonly known as: MARINOL Take 1 capsule (2.5 mg total) by mouth 2 (two) times daily before a meal.   lidocaine-prilocaine cream Commonly known as: EMLA Apply to affected area once   metoprolol tartrate 25 MG tablet Commonly known as: LOPRESSOR TAKE 1 TABLET(25 MG) BY MOUTH TWICE DAILY   NON FORMULARY Take 2 tablets by mouth every other day. [employer] - Blood Builder   NON FORMULARY 1.25 mg every other day. Beet Root Powder   ondansetron 8 MG tablet Commonly known as: Zofran Take 1 tablet (8 mg total) by mouth 2 (two) times daily as needed for refractory nausea / vomiting. Start on day 3 after carboplatin chemo.   potassium chloride SA 20 MEQ tablet Commonly known as: KLOR-CON Take 1 tablet (20 mEq total) by mouth daily.   prochlorperazine 10 MG tablet Commonly known as: COMPAZINE TAKE 1 TABLET(10 MG) BY MOUTH EVERY 6 HOURS AS NEEDED FOR NAUSEA OR VOMITING   S.S.S. TONIC PO Take 45 mLs by mouth every other day.   triamterene-hydrochlorothiazide 75-50 MG tablet Commonly known as: Maxzide Take 1 tablet by mouth daily.   vitamin B-12 1000 MCG tablet Commonly known as: CYANOCOBALAMIN Take 1,000 mcg by mouth daily.   VITAMIN D PO Take 2 tablets by mouth daily.  Allergies: No Known Allergies  Past Medical History, Surgical history, Social history, and Family History were reviewed and updated.  Review of Systems: Review of Systems  Constitutional: Positive for malaise/fatigue and weight loss.  HENT: Negative.   Eyes: Negative.   Respiratory: Negative.   Cardiovascular: Positive for leg swelling.  Gastrointestinal: Positive for nausea.  Genitourinary: Negative.   Musculoskeletal: Negative.   Skin: Negative.   Neurological: Negative.   Endo/Heme/Allergies: Negative.   Psychiatric/Behavioral: Negative.    Vanessa Garrett   Physical Exam:  height is 5\' 1"  (1.549 m) and weight is 154 lb 6.4 oz (70 kg). Her oral temperature is 98.6 F (37 C). Her blood  pressure is 136/64 and her pulse is 69. Her respiration is 18 and oxygen saturation is 99%.   Wt Readings from Last 3 Encounters:  10/28/20 154 lb 6.4 oz (70 kg)  10/22/20 151 lb (68.5 kg)  09/16/20 160 lb 1.3 oz (72.6 kg)    Physical Exam Vitals reviewed.  HENT:     Head: Normocephalic and atraumatic.  Eyes:     Pupils: Pupils are equal, round, and reactive to light.  Cardiovascular:     Rate and Rhythm: Normal rate and regular rhythm.     Heart sounds: Normal heart sounds.  Pulmonary:     Effort: Pulmonary effort is normal.     Breath sounds: Normal breath sounds.  Abdominal:     General: Bowel sounds are normal.     Palpations: Abdomen is soft.  Musculoskeletal:        General: No tenderness or deformity. Normal range of motion.     Cervical back: Normal range of motion.     Comments: Her extremities does show some swelling in the legs.  There might be a little bit more swelling in the right leg than in the left leg.  Lymphadenopathy:     Cervical: No cervical adenopathy.  Skin:    General: Skin is warm and dry.     Findings: No erythema or rash.  Neurological:     Mental Status: She is alert and oriented to person, place, and time.  Psychiatric:        Behavior: Behavior normal.        Thought Content: Thought content normal.        Judgment: Judgment normal.      Lab Results  Component Value Date   WBC 3.6 (L) 10/28/2020   HGB 7.5 (L) 10/28/2020   HCT 23.0 (L) 10/28/2020   MCV 94.3 10/28/2020   PLT 148 (L) 10/28/2020   Lab Results  Component Value Date   FERRITIN 1,087 (H) 10/22/2020   IRON 43 10/22/2020   TIBC 248 10/22/2020   UIBC 204 10/22/2020   IRONPCTSAT 17 (L) 10/22/2020   Lab Results  Component Value Date   RETICCTPCT <0.4 (L) 10/28/2020   RBC 2.37 (L) 10/28/2020   RBC 2.44 (L) 10/28/2020   No results found for: KPAFRELGTCHN, LAMBDASER, KAPLAMBRATIO No results found for: IGGSERUM, IGA, IGMSERUM No results found for: TOTALPROTELP,  ALBUMINELP, A1GS, Lenoria Farrier, GAMS, MSPIKE, SPEI   Chemistry      Component Value Date/Time   NA 133 (L) 10/28/2020 1058   K 4.0 10/28/2020 1058   CL 98 10/28/2020 1058   CO2 27 10/28/2020 1058   BUN 46 (H) 10/28/2020 1058   CREATININE 1.75 (H) 10/28/2020 1058      Component Value Date/Time   CALCIUM 9.6 10/28/2020 1058   ALKPHOS 114 10/28/2020 1058  AST 23 10/28/2020 1058   ALT 19 10/28/2020 1058   BILITOT 0.4 10/28/2020 1058       Impression and Plan: [name] is a very pleasant 80 yo African American female with stage IV adenocarcinoma of the gallbladder.   She is on chemotherapy.  Again, it is hard to say if chemotherapy is helping Vanessa Garrett.  We will go ahead and treat her today and then we will see how the MRI looks.  The CA 19-9 definitely will help Vanessa Garrett out.  Again we are focused on her quality of life.  The quality of life is clearly dictated by her anemia.  She will get Retacrit today.  I really wish we can give her iron and Retacrit together.  Unfortunately we cannot.  She will get iron today and then she will get Retacrit next week.  We will have to alternate the treatments.  If she does have progressive disease, our option might be FOLFOX.  I will also look into the possibility of immunotherapy.  For right now, I am just happy that her quality of life seems to be doing fairly well.  I would like to plan to see her back in 3 weeks.  By then, we will know where things stand with her cancer and hopefully her anemia will be better.   Volanda [facility], MD 3/3/202212:27 PM

## 2020-10-28 NOTE — Patient Instructions (Signed)
Tunneled Central Venous Catheter Flushing Guide  It is important to flush your tunneled central venous catheter each time you use it, both before and after you use it. Flushing your catheter will help prevent it from clogging. What are the risks? Risks may include:  Infection.  Air getting into the catheter and bloodstream. Supplies needed:  A clean pair of gloves.  A disinfecting wipe. Use an alcohol wipe, chlorhexidine wipe, or iodine wipe as told by your health care provider.  A 10 mL syringe that has been prefilled with saline solution.  An empty 10 mL syringe, if a substance called heparin was injected into your catheter. How to flush your catheter When you flush your catheter, make sure you follow any specific instructions from your health care provider or the manufacturer. These are general guidelines. Flushing your catheter before use If there is heparin in your catheter: 1. Wash your hands with soap and water. 2. Put on gloves. 3. Scrub the injection cap for a minimum of 15 seconds with a disinfecting wipe. 4. Unclamp the catheter. 5. Attach the empty syringe to the injection cap. 6. Pull the syringe plunger back and withdraw 10 mL of blood. 7. Place the syringe into an appropriate waste container. 8. Scrub the injection cap for 15 seconds with a disinfecting wipe. 9. Attach the prefilled syringe to the injection cap. 10. Flush the catheter by pushing the plunger forward until all the liquid from the syringe is in the catheter. 11. Remove the syringe from the injection cap. 12. Clamp the catheter. If there is no heparin in your catheter: 1. Wash your hands with soap and water. 2. Put on gloves. 3. Scrub the injection cap for 15 seconds with a disinfecting wipe. 4. Unclamp the catheter. 5. Attach the prefilled syringe to the injection cap. 6. Flush the catheter by pushing the plunger forward until 5 mL of the liquid from the syringe is in the catheter. 7. Pull back on  the syringe until you see blood in the catheter. 8. If you have been asked to collect any blood, follow your health care provider's instructions. Otherwise, flush the catheter with the rest of the solution from the syringe. 9. Remove the syringe from the injection cap. 10. Clamp the catheter.   Flushing your catheter after use 1. Wash your hands with soap and water. 2. Put on gloves. 3. Scrub the injection cap for 15 seconds with a disinfecting wipe. 4. Unclamp the catheter. 5. Attach the prefilled syringe to the injection cap. 6. Flush the catheter by pushing the plunger forward until all of the liquid from the syringe is in the catheter. 7. Remove the syringe from the injection cap. 8. Clamp the catheter. Problems and solutions  If blood cannot be completely cleared from the injection cap, you may need to have the injection cap replaced.  If the catheter is difficult to flush, use the pulsing method. The pulsing method involves pushing only a few milliliters of solution into the catheter at a time and pausing between pushes.  If you do not see blood in the catheter when you pull back on the syringe, change your body position, such as by raising your arms above your head. Take a deep breath and cough. Then, pull back on the syringe. If you still do not see blood, flush the catheter with a small amount of solution. Then, change positions again and take a breath or cough. Pull back on the syringe again. If you still do not   see blood, finish flushing the catheter and contact your health care provider. Do not use your catheter until your health care provider says it is okay. General tips  Have someone help you flush your catheter, if possible.  Do not force fluid through your catheter.  Do not use a syringe that is larger or smaller than 10 mL. Using a smaller syringe can make the catheter burst.  Do not use your catheter without flushing it first if it has heparin in it. Contact a health  care provider if:  You cannot see any blood in the catheter when you flush it before using it.  Your catheter is difficult to flush. Get help right away if:  You cannot flush the catheter.  The catheter leaks when you flush it or when there is fluid in it.  There are cracks or breaks in the catheter. Summary  It is important to flush your tunneled central venous catheter each time you use it, both before and after you use it.  Scrub the injection cap for 15 seconds with a disinfecting wipe before and after you flush it.  When you flush your catheter, make sure you follow any specific instructions from your health care provider or the manufacturer.  Get help right away if you cannot flush the catheter. This information is not intended to replace advice given to you by your health care provider. Make sure you discuss any questions you have with your health care provider. Document Revised: 10/23/2019 Document Reviewed: 10/30/2018 Elsevier Patient Education  2021 [employer] Inc.  

## 2020-10-28 NOTE — Progress Notes (Signed)
Dr. Marin Olp has reviewed today's CBC and CMET, he states it is okay to treat.

## 2020-10-28 NOTE — Telephone Encounter (Signed)
appts adjusted per 10/28/20 los and pt will gain sch in tx/avs      anne

## 2020-10-28 NOTE — Progress Notes (Signed)
Received a call from [name] asking about the timing of patients MRI. She has scheduled it for this Saturday, but based on what Dr Marin Olp had said at the last visit, she wonders if it's too soon.   I reviewed order and it does say scan is expected around 11/15/20. Confirmed with Dr Marin Olp, and he wants MRI completed close to the 11/15/20 date. Notified radiology downstairs to please reschedule. Also spoke to [name] and informed her that MRI should be scheduled for 3/19 or later. She understood.   Oncology Nurse Navigator Documentation  Oncology Nurse Navigator Flowsheets 10/28/2020  Abnormal Finding Date -  Planned Course of Treatment -  Phase of Treatment -  Chemotherapy Actual Start Date: -  Navigator Follow Up Date: 11/16/2020  Navigator Follow Up Reason: Scan Review  Navigator Location [facility]  Referral Date to RadOnc/MedOnc -  Navigator Encounter Type Treatment;Telephone  Telephone Incoming Call;Asess Navigation Needs  Treatment Initiated Date -  Patient Visit Type MedOnc  Treatment Phase Active Tx  Barriers/Navigation Needs Coordination of Care;Education  Education Other  Interventions Coordination of Care;Education;Psycho-Social Support  Acuity Level 2-Minimal Needs (1-2 Barriers Identified)  Referrals -  Coordination of Care Radiology  Education Method Verbal  Support Groups/Services Friends and Family  Time Spent with Patient 30

## 2020-10-29 LAB — IRON AND TIBC
Iron: 239 ug/dL — ABNORMAL HIGH (ref 41–142)
Saturation Ratios: 109 % — ABNORMAL HIGH (ref 21–57)
TIBC: 219 ug/dL — ABNORMAL LOW (ref 236–444)
UIBC: UNDETERMINED ug/dL (ref 120–384)

## 2020-10-29 LAB — FERRITIN: Ferritin: 2486 ng/mL — ABNORMAL HIGH (ref 11–307)

## 2020-10-30 ENCOUNTER — Ambulatory Visit (HOSPITAL_BASED_OUTPATIENT_CLINIC_OR_DEPARTMENT_OTHER): Payer: Medicare PPO

## 2020-10-30 LAB — CANCER ANTIGEN 19-9: CA 19-9: 3289 U/mL — ABNORMAL HIGH (ref 0–35)

## 2020-11-03 ENCOUNTER — Other Ambulatory Visit: Payer: Self-pay | Admitting: Hematology & Oncology

## 2020-11-03 DIAGNOSIS — C23 Malignant neoplasm of gallbladder: Secondary | ICD-10-CM

## 2020-11-04 ENCOUNTER — Encounter: Payer: Self-pay | Admitting: Hematology & Oncology

## 2020-11-04 ENCOUNTER — Inpatient Hospital Stay: Payer: Medicare PPO

## 2020-11-04 ENCOUNTER — Other Ambulatory Visit: Payer: Self-pay

## 2020-11-04 VITALS — BP 145/65 | HR 64 | Temp 97.9°F | Resp 18

## 2020-11-04 DIAGNOSIS — K909 Intestinal malabsorption, unspecified: Secondary | ICD-10-CM

## 2020-11-04 DIAGNOSIS — Z5111 Encounter for antineoplastic chemotherapy: Secondary | ICD-10-CM | POA: Diagnosis not present

## 2020-11-04 DIAGNOSIS — C23 Malignant neoplasm of gallbladder: Secondary | ICD-10-CM

## 2020-11-04 DIAGNOSIS — D500 Iron deficiency anemia secondary to blood loss (chronic): Secondary | ICD-10-CM

## 2020-11-04 LAB — BASIC METABOLIC PANEL - CANCER CENTER ONLY
Anion gap: 8 (ref 5–15)
BUN: 30 mg/dL — ABNORMAL HIGH (ref 8–23)
CO2: 28 mmol/L (ref 22–32)
Calcium: 9.5 mg/dL (ref 8.9–10.3)
Chloride: 93 mmol/L — ABNORMAL LOW (ref 98–111)
Creatinine: 1.57 mg/dL — ABNORMAL HIGH (ref 0.44–1.00)
GFR, Estimated: 33 mL/min — ABNORMAL LOW (ref >60)
Glucose, Bld: 116 mg/dL — ABNORMAL HIGH (ref 70–99)
Potassium: 3.7 mmol/L (ref 3.5–5.1)
Sodium: 129 mmol/L — ABNORMAL LOW (ref 135–145)

## 2020-11-04 LAB — CBC WITH DIFFERENTIAL (CANCER CENTER ONLY)
Abs Immature Granulocytes: 0 10*3/uL (ref 0.00–0.07)
Basophils Absolute: 0 10*3/uL (ref 0.0–0.1)
Basophils Relative: 1 %
Eosinophils Absolute: 0 10*3/uL (ref 0.0–0.5)
Eosinophils Relative: 0 %
HCT: 20.9 % — ABNORMAL LOW (ref 36.0–46.0)
Hemoglobin: 7 g/dL — ABNORMAL LOW (ref 12.0–15.0)
Immature Granulocytes: 0 %
Lymphocytes Relative: 26 %
Lymphs Abs: 0.4 10*3/uL — ABNORMAL LOW (ref 0.7–4.0)
MCH: 31 pg (ref 26.0–34.0)
MCHC: 33.5 g/dL (ref 30.0–36.0)
MCV: 92.5 fL (ref 80.0–100.0)
Monocytes Absolute: 0 10*3/uL — ABNORMAL LOW (ref 0.1–1.0)
Monocytes Relative: 2 %
Neutro Abs: 1.1 10*3/uL — ABNORMAL LOW (ref 1.7–7.7)
Neutrophils Relative %: 71 %
Platelet Count: 39 10*3/uL — ABNORMAL LOW (ref 150–400)
RBC: 2.26 MIL/uL — ABNORMAL LOW (ref 3.87–5.11)
RDW: 15.2 % (ref 11.5–15.5)
WBC Count: 1.5 10*3/uL — ABNORMAL LOW (ref 4.0–10.5)
nRBC: 0 % (ref 0.0–0.2)

## 2020-11-04 MED ORDER — EPOETIN ALFA-EPBX 40000 UNIT/ML IJ SOLN
INTRAMUSCULAR | Status: AC
  Filled 2020-11-04: qty 1

## 2020-11-04 MED ORDER — HEPARIN SOD (PORK) LOCK FLUSH 100 UNIT/ML IV SOLN
500.0000 [IU] | Freq: Once | INTRAVENOUS | Status: AC
  Administered 2020-11-04: 500 [IU] via INTRAVENOUS
  Filled 2020-11-04: qty 5

## 2020-11-04 MED ORDER — SODIUM CHLORIDE 0.9% FLUSH
10.0000 mL | Freq: Once | INTRAVENOUS | Status: AC
  Administered 2020-11-04: 10 mL
  Filled 2020-11-04: qty 10

## 2020-11-04 MED ORDER — EPOETIN ALFA-EPBX 40000 UNIT/ML IJ SOLN
40000.0000 [IU] | Freq: Once | INTRAMUSCULAR | Status: AC
  Administered 2020-11-04: 40000 [IU] via SUBCUTANEOUS

## 2020-11-04 NOTE — Addendum Note (Signed)
Addended by: Perlie Gold on: 11/04/2020 10:35 AM   Modules accepted: Orders

## 2020-11-04 NOTE — Patient Instructions (Signed)
Epoetin Alfa injection (Retacrit) What is this medicine? EPOETIN ALFA (e POE e tin AL fa) helps your body make more red blood cells. This medicine is used to treat anemia caused by chronic kidney disease, cancer chemotherapy, or HIV-therapy. It may also be used before surgery if you have anemia. This medicine may be used for other purposes; ask your health care provider or pharmacist if you have questions. COMMON BRAND NAME(S): Epogen, Procrit, Retacrit What should I tell my health care provider before I take this medicine? They need to know if you have any of these conditions:  cancer  heart disease  high blood pressure  history of blood clots  history of stroke  low levels of folate, iron, or vitamin B12 in the blood  seizures  an unusual or allergic reaction to erythropoietin, albumin, benzyl alcohol, hamster proteins, other medicines, foods, dyes, or preservatives  pregnant or trying to get pregnant  breast-feeding How should I use this medicine? This medicine is for injection into a vein or under the skin. It is usually given by a health care professional in a hospital or clinic setting. If you get this medicine at home, you will be taught how to prepare and give this medicine. Use exactly as directed. Take your medicine at regular intervals. Do not take your medicine more often than directed. It is important that you put your used needles and syringes in a special sharps container. Do not put them in a trash can. If you do not have a sharps container, call your pharmacist or healthcare provider to get one. A special MedGuide will be given to you by the pharmacist with each prescription and refill. Be sure to read this information carefully each time. Talk to your pediatrician regarding the use of this medicine in children. While this drug may be prescribed for selected conditions, precautions do apply. Overdosage: If you think you have taken too much of this medicine contact a  poison control center or emergency room at once. NOTE: This medicine is only for you. Do not share this medicine with others. What if I miss a dose? If you miss a dose, take it as soon as you can. If it is almost time for your next dose, take only that dose. Do not take double or extra doses. What may interact with this medicine? Interactions have not been studied. This list may not describe all possible interactions. Give your health care provider a list of all the medicines, herbs, non-prescription drugs, or dietary supplements you use. Also tell them if you smoke, drink alcohol, or use illegal drugs. Some items may interact with your medicine. What should I watch for while using this medicine? Your condition will be monitored carefully while you are receiving this medicine. You may need blood work done while you are taking this medicine. This medicine may cause a decrease in vitamin B6. You should make sure that you get enough vitamin B6 while you are taking this medicine. Discuss the foods you eat and the vitamins you take with your health care professional. What side effects may I notice from receiving this medicine? Side effects that you should report to your doctor or health care professional as soon as possible:  allergic reactions like skin rash, itching or hives, swelling of the face, lips, or tongue  seizures  signs and symptoms of a blood clot such as breathing problems; changes in vision; chest pain; severe, sudden headache; pain, swelling, warmth in the leg; trouble speaking; sudden numbness   or weakness of the face, arm or leg  signs and symptoms of a stroke like changes in vision; confusion; trouble speaking or understanding; severe headaches; sudden numbness or weakness of the face, arm or leg; trouble walking; dizziness; loss of balance or coordination Side effects that usually do not require medical attention (report to your doctor or health care professional if they continue  or are bothersome):  chills  cough  dizziness  fever  headaches  joint pain  muscle cramps  muscle pain  nausea, vomiting  pain, redness, or irritation at site where injected This list may not describe all possible side effects. Call your doctor for medical advice about side effects. You may report side effects to FDA at 1-800-FDA-1088. Where should I keep my medicine? Keep out of the reach of children. Store in a refrigerator between 2 and 8 degrees C (36 and 46 degrees F). Do not freeze or shake. Throw away any unused portion if using a single-dose vial. Multi-dose vials can be kept in the refrigerator for up to 21 days after the initial dose. Throw away unused medicine. NOTE: This sheet is a summary. It may not cover all possible information. If you have questions about this medicine, talk to your doctor, pharmacist, or health care provider.  2021 Elsevier/Gold Standard (2017-03-23 08:35:19)  

## 2020-11-04 NOTE — Patient Instructions (Signed)

## 2020-11-05 ENCOUNTER — Other Ambulatory Visit: Payer: Self-pay | Admitting: *Deleted

## 2020-11-05 ENCOUNTER — Other Ambulatory Visit: Payer: Medicare PPO

## 2020-11-05 ENCOUNTER — Ambulatory Visit: Payer: Medicare PPO

## 2020-11-05 ENCOUNTER — Encounter: Payer: Self-pay | Admitting: *Deleted

## 2020-11-05 ENCOUNTER — Inpatient Hospital Stay: Payer: Medicare PPO

## 2020-11-05 DIAGNOSIS — C23 Malignant neoplasm of gallbladder: Secondary | ICD-10-CM

## 2020-11-05 DIAGNOSIS — IMO0001 Reserved for inherently not codable concepts without codable children: Secondary | ICD-10-CM

## 2020-11-05 DIAGNOSIS — Z789 Other specified health status: Secondary | ICD-10-CM

## 2020-11-05 DIAGNOSIS — D500 Iron deficiency anemia secondary to blood loss (chronic): Secondary | ICD-10-CM

## 2020-11-05 DIAGNOSIS — D631 Anemia in chronic kidney disease: Secondary | ICD-10-CM

## 2020-11-05 MED ORDER — RETACRIT 10000 UNIT/ML IJ SOLN: 10000.0000 [IU] | INTRAMUSCULAR | 3 refills | Status: DC

## 2020-11-05 NOTE — Progress Notes (Signed)
Please see MyChart communication dated 11/04/20  Oncology Nurse Navigator Documentation  Oncology Nurse Navigator Flowsheets 11/05/2020  Abnormal Finding Date -  Planned Course of Treatment -  Phase of Treatment -  Chemotherapy Actual Start Date: -  Navigator Follow Up Date: 11/16/2020  Navigator Follow Up Reason: Scan Review  Navigator Location [facility]  Referral Date to RadOnc/MedOnc -  Navigator Encounter Type MyChart  Telephone Medication Assistance  Treatment Initiated Date -  Patient Visit Type MedOnc  Treatment Phase Active Tx  Barriers/Navigation Needs Coordination of Care;Education  Education Other  Interventions Coordination of Care;Education;Medication Assistance  Acuity Level 2-Minimal Needs (1-2 Barriers Identified)  Referrals -  Coordination of Care Other  Education Method Written  Support Groups/Services Friends and Family  Time Spent with Patient 18

## 2020-11-06 ENCOUNTER — Other Ambulatory Visit (HOSPITAL_BASED_OUTPATIENT_CLINIC_OR_DEPARTMENT_OTHER): Payer: Medicare PPO

## 2020-11-11 ENCOUNTER — Other Ambulatory Visit: Payer: Self-pay

## 2020-11-11 ENCOUNTER — Other Ambulatory Visit: Payer: Self-pay | Admitting: *Deleted

## 2020-11-11 ENCOUNTER — Inpatient Hospital Stay: Payer: Medicare PPO

## 2020-11-11 ENCOUNTER — Telehealth: Payer: Self-pay | Admitting: *Deleted

## 2020-11-11 ENCOUNTER — Ambulatory Visit: Payer: Medicare PPO

## 2020-11-11 ENCOUNTER — Encounter: Payer: Self-pay | Admitting: *Deleted

## 2020-11-11 VITALS — BP 143/67 | HR 64 | Temp 97.9°F | Resp 17

## 2020-11-11 DIAGNOSIS — D500 Iron deficiency anemia secondary to blood loss (chronic): Secondary | ICD-10-CM

## 2020-11-11 DIAGNOSIS — K909 Intestinal malabsorption, unspecified: Secondary | ICD-10-CM

## 2020-11-11 DIAGNOSIS — Z5111 Encounter for antineoplastic chemotherapy: Secondary | ICD-10-CM | POA: Diagnosis not present

## 2020-11-11 DIAGNOSIS — D631 Anemia in chronic kidney disease: Secondary | ICD-10-CM

## 2020-11-11 DIAGNOSIS — C23 Malignant neoplasm of gallbladder: Secondary | ICD-10-CM

## 2020-11-11 DIAGNOSIS — Z789 Other specified health status: Secondary | ICD-10-CM

## 2020-11-11 DIAGNOSIS — Z95828 Presence of other vascular implants and grafts: Secondary | ICD-10-CM

## 2020-11-11 DIAGNOSIS — IMO0001 Reserved for inherently not codable concepts without codable children: Secondary | ICD-10-CM

## 2020-11-11 LAB — CBC WITH DIFFERENTIAL (CANCER CENTER ONLY)
Abs Immature Granulocytes: 0 10*3/uL (ref 0.00–0.07)
Basophils Absolute: 0 10*3/uL (ref 0.0–0.1)
Basophils Relative: 1 %
Eosinophils Absolute: 0 10*3/uL (ref 0.0–0.5)
Eosinophils Relative: 1 %
HCT: 19.9 % — ABNORMAL LOW (ref 36.0–46.0)
Hemoglobin: 6.6 g/dL — CL (ref 12.0–15.0)
Immature Granulocytes: 0 %
Lymphocytes Relative: 28 %
Lymphs Abs: 0.5 10*3/uL — ABNORMAL LOW (ref 0.7–4.0)
MCH: 31.6 pg (ref 26.0–34.0)
MCHC: 33.2 g/dL (ref 30.0–36.0)
MCV: 95.2 fL (ref 80.0–100.0)
Monocytes Absolute: 0.5 10*3/uL (ref 0.1–1.0)
Monocytes Relative: 28 %
Neutro Abs: 0.7 10*3/uL — ABNORMAL LOW (ref 1.7–7.7)
Neutrophils Relative %: 42 %
Platelet Count: 269 10*3/uL (ref 150–400)
RBC: 2.09 MIL/uL — ABNORMAL LOW (ref 3.87–5.11)
RDW: 17.2 % — ABNORMAL HIGH (ref 11.5–15.5)
WBC Count: 1.6 10*3/uL — ABNORMAL LOW (ref 4.0–10.5)
nRBC: 0 % (ref 0.0–0.2)

## 2020-11-11 LAB — BASIC METABOLIC PANEL - CANCER CENTER ONLY
Anion gap: 8 (ref 5–15)
BUN: 23 mg/dL (ref 8–23)
CO2: 27 mmol/L (ref 22–32)
Calcium: 8.9 mg/dL (ref 8.9–10.3)
Chloride: 93 mmol/L — ABNORMAL LOW (ref 98–111)
Creatinine: 1.92 mg/dL — ABNORMAL HIGH (ref 0.44–1.00)
GFR, Estimated: 26 mL/min — ABNORMAL LOW (ref >60)
Glucose, Bld: 101 mg/dL — ABNORMAL HIGH (ref 70–99)
Potassium: 3.8 mmol/L (ref 3.5–5.1)
Sodium: 128 mmol/L — ABNORMAL LOW (ref 135–145)

## 2020-11-11 MED ORDER — RETACRIT 10000 UNIT/ML IJ SOLN: 10000.0000 [IU] | INTRAMUSCULAR | 3 refills | Status: DC

## 2020-11-11 MED ORDER — EPOETIN ALFA-EPBX 40000 UNIT/ML IJ SOLN
INTRAMUSCULAR | Status: AC
  Filled 2020-11-11: qty 1

## 2020-11-11 MED ORDER — SODIUM CHLORIDE 0.9% FLUSH
10.0000 mL | Freq: Once | INTRAVENOUS | Status: AC
  Administered 2020-11-11: 10 mL via INTRAVENOUS
  Filled 2020-11-11: qty 10

## 2020-11-11 MED ORDER — HEPARIN SOD (PORK) LOCK FLUSH 100 UNIT/ML IV SOLN
500.0000 [IU] | Freq: Once | INTRAVENOUS | Status: AC
  Administered 2020-11-11: 500 [IU] via INTRAVENOUS
  Filled 2020-11-11: qty 5

## 2020-11-11 MED ORDER — EPOETIN ALFA-EPBX 40000 UNIT/ML IJ SOLN
40000.0000 [IU] | Freq: Once | INTRAMUSCULAR | Status: AC
  Administered 2020-11-11: 40000 [IU] via SUBCUTANEOUS

## 2020-11-11 NOTE — Patient Instructions (Signed)

## 2020-11-11 NOTE — Telephone Encounter (Signed)
Jory Ee NP notified of HGB-6.6.  Pt to receive Retacrit today and is scheduled to come back on Monday, 11/15/20 for lab work.  No new orders received at this time.

## 2020-11-11 NOTE — Progress Notes (Signed)
Patient's daughter, [name] calling to get update on patient's prescription for Retacrit which was sent to the pharamcy last Friday.   [facility] and was told they cancelled the prescription as it needed to be sent to [facility]. I asked whether the prescription was transferred, or if they called the office to notify us, and they did not. Prescription resent to [facility].  Called [name] and informed her of the above. Also gave her the phone number to [place] so that she could also keep updated to the progress.   Oncology Nurse Navigator Documentation  Oncology Nurse Navigator Flowsheets 11/11/2020  Abnormal Finding Date -  Planned Course of Treatment -  Phase of Treatment -  Chemotherapy Actual Start Date: -  Navigator Follow Up Date: 11/16/2020  Navigator Follow Up Reason: Scan Review  Navigator Location [facility]  Referral Date to RadOnc/MedOnc -  Navigator Encounter Type Telephone  Telephone Medication Assistance;Incoming Call  Treatment Initiated Date -  Patient Visit Type MedOnc  Treatment Phase Active Tx  Barriers/Navigation Needs Coordination of Care;Education  Education Other  Interventions Medication Assistance;Psycho-Social Support  Acuity Level 2-Minimal Needs (1-2 Barriers Identified)  Referrals -  Coordination of Care -  Education Method Verbal;Teach-back  Support Groups/Services Friends and Family  Time Spent with Patient 30

## 2020-11-12 ENCOUNTER — Encounter: Payer: Self-pay | Admitting: *Deleted

## 2020-11-12 NOTE — Progress Notes (Signed)
[facility] to confirm receipt of the retacrit prescription. They did confirm they received it, and also stated it was ready for dispensing. Per their records they called the patient yesterday afternoon and left a message. They will need to call back to confirm and arrange delivery. Copay will be slightly less than $3  Called [name] and gave her update. She will call [employer] and confirm with them.   Oncology Nurse Navigator Documentation  Oncology Nurse Navigator Flowsheets 11/12/2020  Abnormal Finding Date -  Planned Course of Treatment -  Phase of Treatment -  Chemotherapy Actual Start Date: -  Navigator Follow Up Date: 11/16/2020  Navigator Follow Up Reason: Scan Review  Navigator Location [facility]  Referral Date to RadOnc/MedOnc -  Navigator Encounter Type Telephone  Telephone Medication Assistance;Outgoing Call  Treatment Initiated Date -  Patient Visit Type MedOnc  Treatment Phase Active Tx  Barriers/Navigation Needs Coordination of Care;Education  Education Other  Interventions Medication Assistance;Psycho-Social Support  Acuity Level 2-Minimal Needs (1-2 Barriers Identified)  Referrals -  Coordination of Care -  Education Method Verbal  Support Groups/Services Friends and Family  Time Spent with Patient 30

## 2020-11-13 ENCOUNTER — Encounter: Payer: Self-pay | Admitting: Hematology & Oncology

## 2020-11-13 ENCOUNTER — Ambulatory Visit (HOSPITAL_BASED_OUTPATIENT_CLINIC_OR_DEPARTMENT_OTHER)
Admission: RE | Admit: 2020-11-13 | Discharge: 2020-11-13 | Disposition: A | Discharge status: Home / Self Care | Payer: Medicare PPO | Source: Ambulatory Visit | Attending: Hematology & Oncology | Admitting: Hematology & Oncology

## 2020-11-13 ENCOUNTER — Other Ambulatory Visit: Payer: Self-pay

## 2020-11-13 DIAGNOSIS — C23 Malignant neoplasm of gallbladder: Secondary | ICD-10-CM

## 2020-11-13 IMAGING — MR MR ABDOMEN WO/W CM
10 of 18 series · 24 of 48 positions shown · IV contrast (gadavist)
Comparison: Abdominal MRI [DATE].

CLINICAL DATA: 80-year-old female with history of hepatobiliary
cancer on chemotherapy. Follow-up study.

EXAM:
MRI ABDOMEN WITHOUT AND WITH CONTRAST
TECHNIQUE: Multiplanar multisequence MR imaging of the abdomen was performed
both before and after the administration of intravenous contrast.
CONTRAST:  7mL GADAVIST GADOBUTROL 1 MMOL/ML IV SOLN

[Series 4: T2 · coronal · 7.0mm · 1.33mm/px · 1 of 27 slices shown]
[im 1/27]
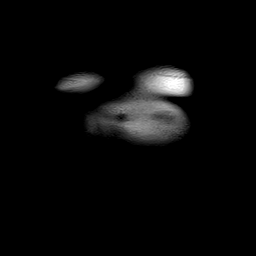

[Series 5: in + out · axial · 7.0mm · 0.68mm/px · z∈[-225,+46]mm · 2 of 64 slices shown]
[im 1/64]
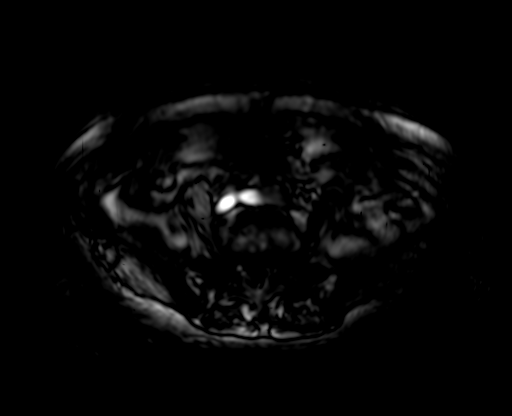
[im 64/64]
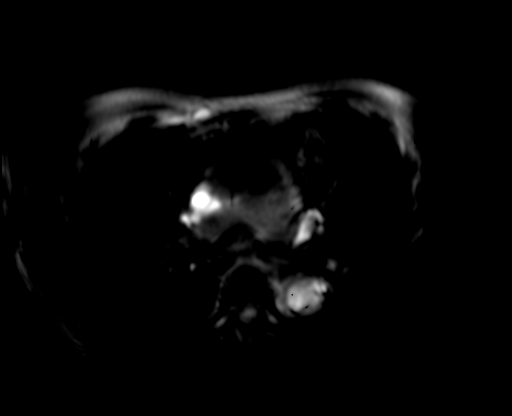

[Series 6: T2 fat-sat · axial · 7.0mm · 1.41mm/px · 1 of 34 slices shown]
[im 1/34]
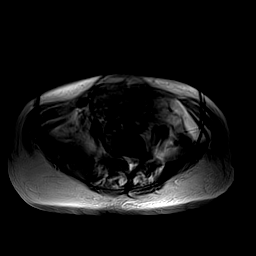

[Series 7: [id number]_[id number]_[id number] free breathing · axial · 7.0mm · 1.93mm/px · z∈[-222,+55]mm · 4 of 92 slices shown]
[im 1/92]
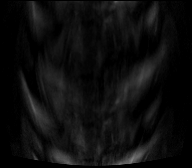
[im 31/92]
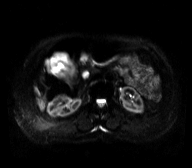
[im 61/92]
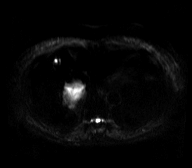
[im 92/92]
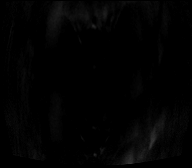

[Series 8: ep2d_[id number] free breathing_adc · axial · 7.0mm · 1.93mm/px · z∈[-222,+55]mm · 2 of 32 slices shown]
[im 1/32]
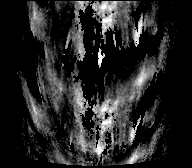
[im 32/32]
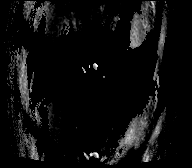

[Series 9: T1 dynamic · axial · non-contrast · 5.0mm · 0.66mm/px · z∈[-229,+46]mm · 3 of 56 slices shown (1 of 5)]
[im 1/56]
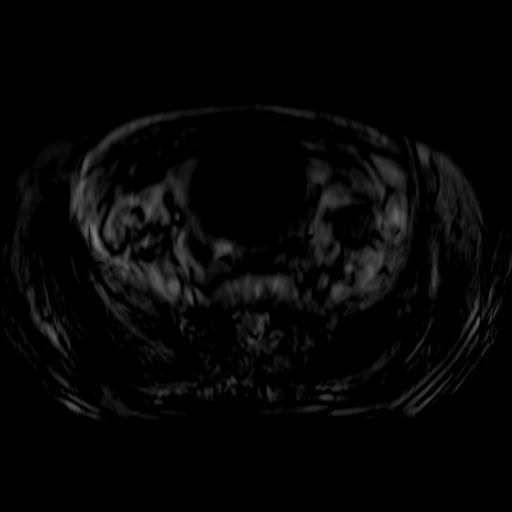
[im 28/56]
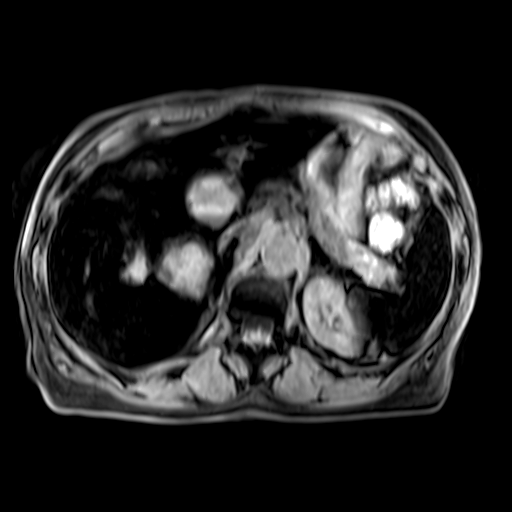
[im 56/56]
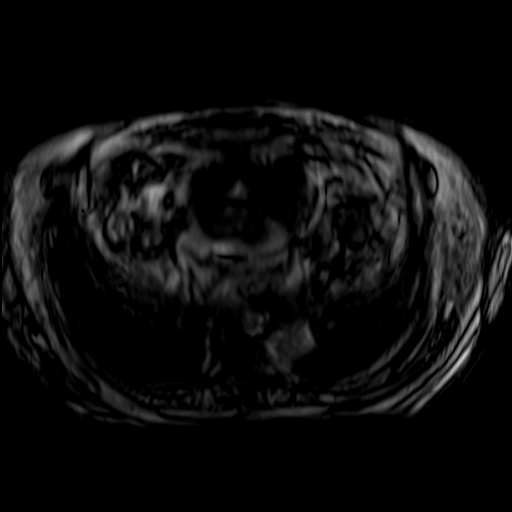

[Series 10: T1 dynamic · axial · 5.0mm · 0.66mm/px · z∈[-229,+46]mm · 3 of 56 slices shown (2 of 5)]
[im 1/56]
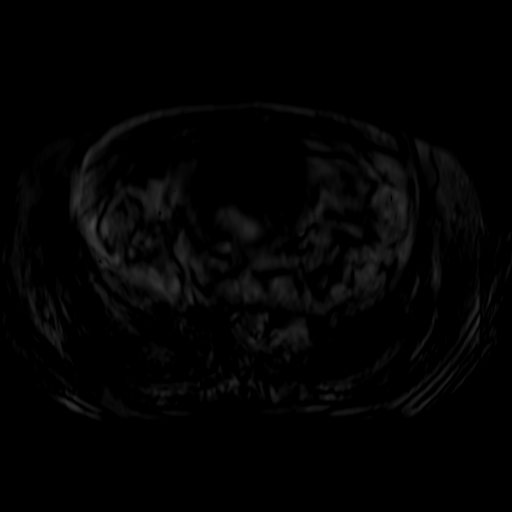
[im 28/56]
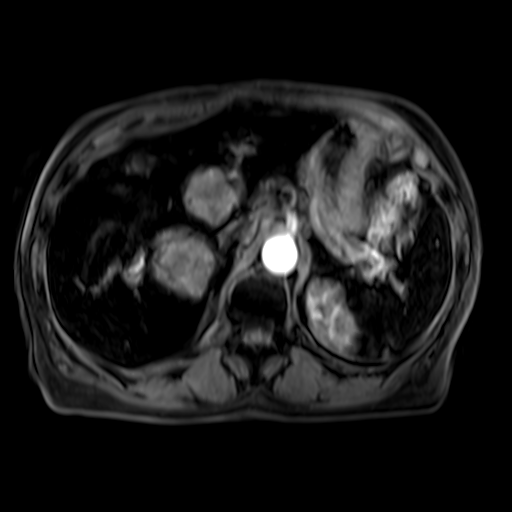
[im 56/56]
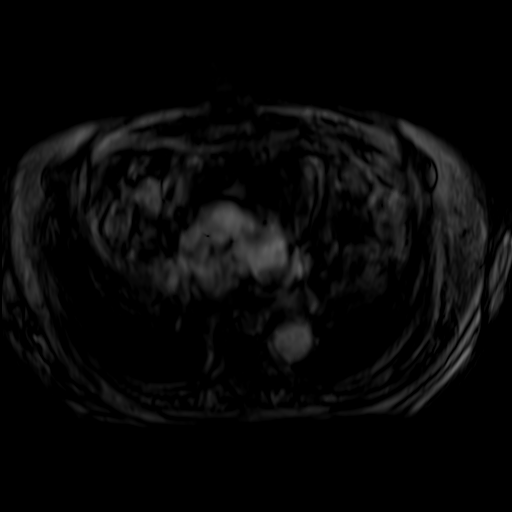

[Series 11: T1 dynamic · axial · 5.0mm · 0.66mm/px · z∈[-229,+46]mm · 3 of 56 slices shown (3 of 5)]
[im 1/56]
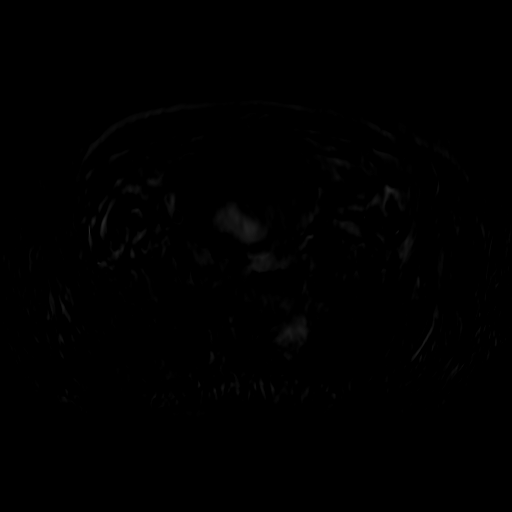
[im 28/56]
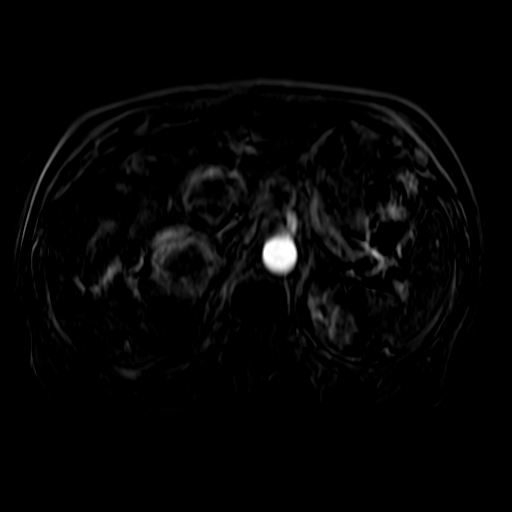
[im 56/56]
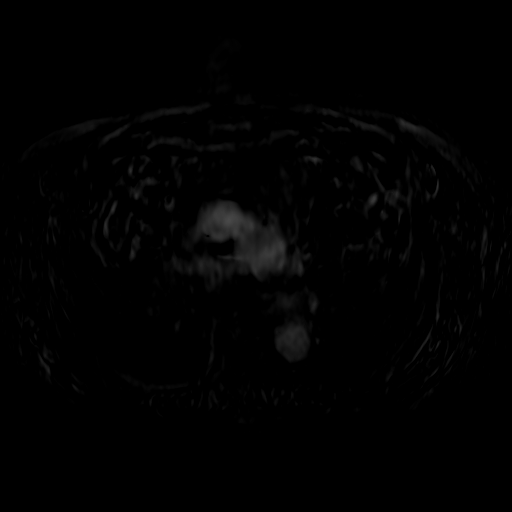

[Series 12: T1 dynamic · axial · 5.0mm · 0.66mm/px · z∈[-229,+46]mm · 3 of 56 slices shown (4 of 5)]
[im 1/56]
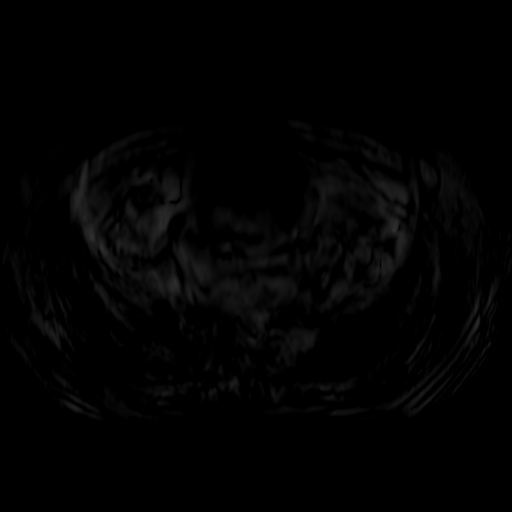
[im 28/56]
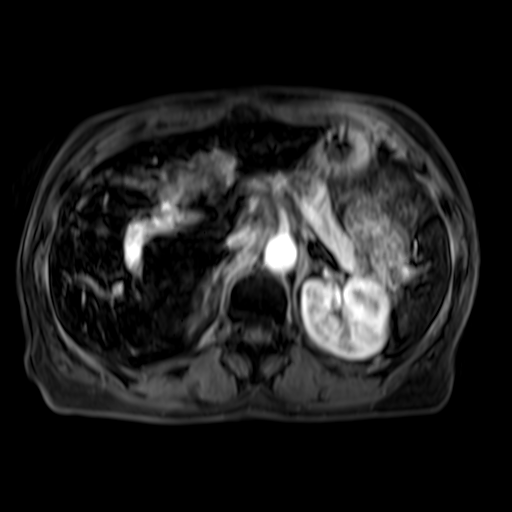
[im 56/56]
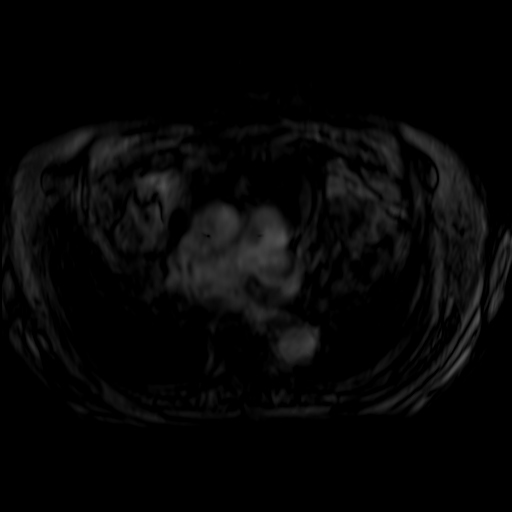

[Series 13: T1 dynamic · axial · 5.0mm · 0.66mm/px · z∈[-229,-94]mm · 2 of 56 slices shown (5 of 5)]
[im 1/56]
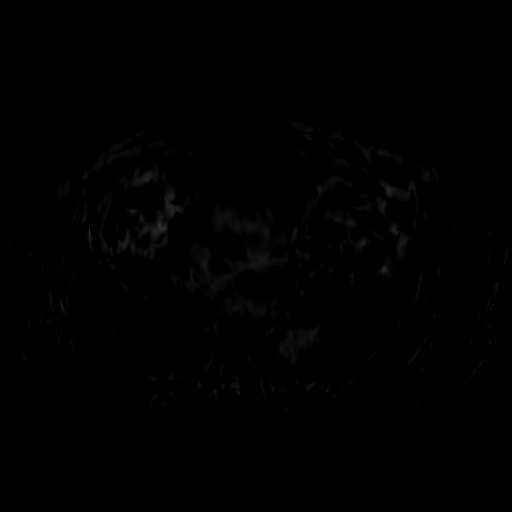
[im 28/56]
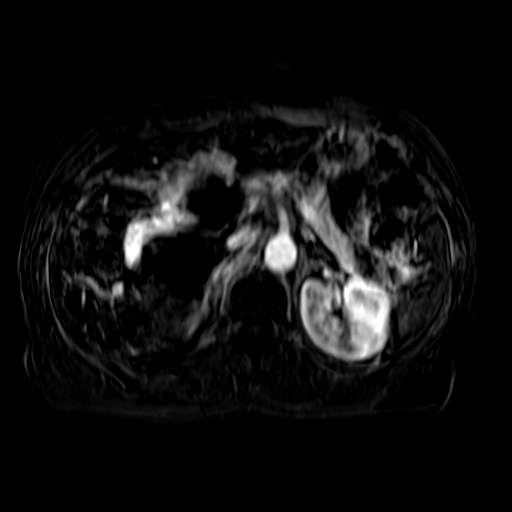

[24 of 48 positions shown; findings below may reference images not displayed]

FINDINGS: Lower chest: Unremarkable.

Hepatobiliary: Severe diffuse low signal intensity throughout the
hepatic parenchyma on all T1 and T2 weighted sequences, presumably
indicative of severe hepatic iron deposition. Multiple hepatic
lesions are again noted demonstrating heterogeneous enhancement on
post gadolinium imaging. These include the largest lesion which is
in the gallbladder fossa, likely to represent locally recurrent
gallbladder carcinoma measuring 6.9 x 5.3 cm (axial image 42 of
series 17), previously 7.3 x 5.7 cm on [DATE]). Lesion in the
superior aspect of segment 7 of the liver (axial image 18 of series
17) which currently measures 4.1 x 3.4 cm (previously 3.3 x 3.0 cm).
Lesion centered between segments 5 and 6 in the central aspect of
the right lobe of the liver (axial image 26 of series 17) currently
measuring 6.6 x 5.4 cm (previously 6.2 x 5.7 cm). Lesion in the
central aspect of the left lobe of the liver predominantly centered
in segment 4B (axial image 27 of series 17) measuring 4.9 x 4.3 cm
(previously 4.9 x 3.7 cm). No new hepatic lesions are otherwise
identified. There continues to be moderate intrahepatic biliary
ductal dilatation throughout the left lobe of the liver, similar to
the prior examination, presumably reflective of central compression
related to mass effect from the underlying lesions. No right-sided
intrahepatic biliary ductal dilatation. Common bile duct measures 8
mm in the porta hepatis (within normal limits for the patient's
age). No filling defect within the common bile duct to suggest
choledocholithiasis.

Pancreas: No pancreatic mass. No pancreatic ductal dilatation. No
pancreatic or peripancreatic fluid collections or inflammatory
changes.

Spleen: Diffuse low signal intensity throughout the splenic
parenchyma on all pre gadolinium pulse sequences, indicative of
severe iron deposition. Otherwise, unremarkable.

Adrenals/Urinary Tract: Bilateral kidneys and adrenal glands are
normal in appearance. No hydroureteronephrosis in the visualized
portions of the abdomen.

Stomach/Bowel: Visualized portions are unremarkable.

Vascular/Lymphatic: No aneurysm identified in the visualized
abdominal vasculature. No lymphadenopathy noted in the visualized
abdomen.

Other: No significant volume of ascites noted in the visualized
portions of the peritoneal cavity.

Musculoskeletal: No aggressive appearing osseous lesions are noted
in the visualized portions of the skeleton.
IMPRESSION: 1. Today's examination once again demonstrates evidence of local
recurrence of gallbladder carcinoma in the gallbladder fossa with
multiple intrahepatic metastatic lesions which are grossly stable to
slightly decreased in size compared to the prior examination. No new
hepatic lesions are otherwise noted. Persistent moderate
intrahepatic biliary ductal dilatation noted throughout the left
lobe of the liver.
2. Severe iron deposition in the liver and spleen.

## 2020-11-13 MED ORDER — GADOBUTROL 1 MMOL/ML IV SOLN
7.0000 mL | Freq: Once | INTRAVENOUS | Status: AC | PRN
  Administered 2020-11-13: 7 mL via INTRAVENOUS

## 2020-11-15 ENCOUNTER — Other Ambulatory Visit: Payer: Self-pay

## 2020-11-15 ENCOUNTER — Inpatient Hospital Stay: Payer: Medicare PPO

## 2020-11-15 ENCOUNTER — Other Ambulatory Visit: Payer: Self-pay | Admitting: Hematology & Oncology

## 2020-11-15 VITALS — BP 167/78 | HR 66 | Temp 98.8°F | Resp 18

## 2020-11-15 DIAGNOSIS — C23 Malignant neoplasm of gallbladder: Secondary | ICD-10-CM

## 2020-11-15 DIAGNOSIS — Z95828 Presence of other vascular implants and grafts: Secondary | ICD-10-CM

## 2020-11-15 DIAGNOSIS — D500 Iron deficiency anemia secondary to blood loss (chronic): Secondary | ICD-10-CM

## 2020-11-15 DIAGNOSIS — Z5111 Encounter for antineoplastic chemotherapy: Secondary | ICD-10-CM | POA: Diagnosis not present

## 2020-11-15 DIAGNOSIS — K909 Intestinal malabsorption, unspecified: Secondary | ICD-10-CM

## 2020-11-15 LAB — CBC WITH DIFFERENTIAL (CANCER CENTER ONLY)
Abs Immature Granulocytes: 0.02 10*3/uL (ref 0.00–0.07)
Basophils Absolute: 0 10*3/uL (ref 0.0–0.1)
Basophils Relative: 0 %
Eosinophils Absolute: 0 10*3/uL (ref 0.0–0.5)
Eosinophils Relative: 0 %
HCT: 23.9 % — ABNORMAL LOW (ref 36.0–46.0)
Hemoglobin: 7.6 g/dL — ABNORMAL LOW (ref 12.0–15.0)
Immature Granulocytes: 1 %
Lymphocytes Relative: 23 %
Lymphs Abs: 0.5 10*3/uL — ABNORMAL LOW (ref 0.7–4.0)
MCH: 31.7 pg (ref 26.0–34.0)
MCHC: 31.8 g/dL (ref 30.0–36.0)
MCV: 99.6 fL (ref 80.0–100.0)
Monocytes Absolute: 0.9 10*3/uL (ref 0.1–1.0)
Monocytes Relative: 41 %
Neutro Abs: 0.8 10*3/uL — ABNORMAL LOW (ref 1.7–7.7)
Neutrophils Relative %: 35 %
Platelet Count: 363 10*3/uL (ref 150–400)
RBC: 2.4 MIL/uL — ABNORMAL LOW (ref 3.87–5.11)
RDW: 19.4 % — ABNORMAL HIGH (ref 11.5–15.5)
WBC Count: 2.3 10*3/uL — ABNORMAL LOW (ref 4.0–10.5)
nRBC: 0 % (ref 0.0–0.2)

## 2020-11-15 LAB — BASIC METABOLIC PANEL - CANCER CENTER ONLY
Anion gap: 7 (ref 5–15)
BUN: 24 mg/dL — ABNORMAL HIGH (ref 8–23)
CO2: 27 mmol/L (ref 22–32)
Calcium: 9.2 mg/dL (ref 8.9–10.3)
Chloride: 94 mmol/L — ABNORMAL LOW (ref 98–111)
Creatinine: 1.72 mg/dL — ABNORMAL HIGH (ref 0.44–1.00)
GFR, Estimated: 30 mL/min — ABNORMAL LOW (ref >60)
Glucose, Bld: 111 mg/dL — ABNORMAL HIGH (ref 70–99)
Potassium: 3.7 mmol/L (ref 3.5–5.1)
Sodium: 128 mmol/L — ABNORMAL LOW (ref 135–145)

## 2020-11-15 MED ORDER — EPOETIN ALFA-EPBX 40000 UNIT/ML IJ SOLN
40000.0000 [IU] | Freq: Once | INTRAMUSCULAR | Status: AC
  Administered 2020-11-15: 40000 [IU] via SUBCUTANEOUS

## 2020-11-15 MED ORDER — HEPARIN SOD (PORK) LOCK FLUSH 100 UNIT/ML IV SOLN
500.0000 [IU] | Freq: Once | INTRAVENOUS | Status: AC
  Administered 2020-11-15: 500 [IU] via INTRAVENOUS
  Filled 2020-11-15: qty 5

## 2020-11-15 MED ORDER — EPOETIN ALFA-EPBX 40000 UNIT/ML IJ SOLN
INTRAMUSCULAR | Status: AC
  Filled 2020-11-15: qty 1

## 2020-11-15 MED ORDER — SODIUM CHLORIDE 0.9% FLUSH
10.0000 mL | Freq: Once | INTRAVENOUS | Status: AC
  Administered 2020-11-15: 10 mL via INTRAVENOUS
  Filled 2020-11-15: qty 10

## 2020-11-15 NOTE — Patient Instructions (Signed)

## 2020-11-15 NOTE — Patient Instructions (Signed)

## 2020-11-16 ENCOUNTER — Encounter: Payer: Self-pay | Admitting: *Deleted

## 2020-11-16 ENCOUNTER — Telehealth: Payer: Self-pay | Admitting: *Deleted

## 2020-11-16 NOTE — Progress Notes (Signed)
Oncology Nurse Navigator Documentation  Oncology Nurse Navigator Flowsheets 11/16/2020  Abnormal Finding Date -  Planned Course of Treatment -  Phase of Treatment -  Chemotherapy Actual Start Date: -  Navigator Follow Up Date: 11/19/2020  Navigator Follow Up Reason: Follow-up Appointment;Chemotherapy  Navigator Location [facility]  Referral Date to RadOnc/MedOnc -  Navigator Encounter Type Scan Review  Telephone -  Treatment Initiated Date -  Patient Visit Type MedOnc  Treatment Phase Active Tx  Barriers/Navigation Needs Coordination of Care;Education  Education -  Interventions None Required  Acuity Level 2-Minimal Needs (1-2 Barriers Identified)  Referrals -  Coordination of Care -  Education Method -  Support Groups/Services Friends and Family  Time Spent with Patient 15

## 2020-11-16 NOTE — Telephone Encounter (Signed)
-----   Message from Volanda Napoleon, MD sent at 11/15/2020  6:18 PM EDT ----- Call - the tumor is actually a little smaller.  I am now going to add immunotherapy to the chemo!!!  Hopefully this will help a little more!!  Vanessa Garrett

## 2020-11-16 NOTE — Telephone Encounter (Signed)
As noted below by Dr. Marin Olp, I informed the daughter, [name], that her mother's tumor is actually a little smaller.  Dr. Marin Olp is going to add immunotherapy to the chemotherapy. This should help a little more. [name] verbalized understanding.

## 2020-11-19 ENCOUNTER — Other Ambulatory Visit: Payer: Self-pay | Admitting: *Deleted

## 2020-11-19 ENCOUNTER — Other Ambulatory Visit: Payer: Self-pay

## 2020-11-19 ENCOUNTER — Inpatient Hospital Stay: Payer: Medicare PPO | Admitting: Hematology & Oncology

## 2020-11-19 ENCOUNTER — Inpatient Hospital Stay: Payer: Medicare PPO

## 2020-11-19 ENCOUNTER — Encounter: Payer: Self-pay | Admitting: Hematology & Oncology

## 2020-11-19 ENCOUNTER — Encounter: Payer: Self-pay | Admitting: *Deleted

## 2020-11-19 VITALS — BP 171/81 | HR 67 | Temp 98.4°F | Resp 18 | Wt 151.0 lb

## 2020-11-19 DIAGNOSIS — R160 Hepatomegaly, not elsewhere classified: Secondary | ICD-10-CM

## 2020-11-19 DIAGNOSIS — C23 Malignant neoplasm of gallbladder: Secondary | ICD-10-CM | POA: Diagnosis not present

## 2020-11-19 DIAGNOSIS — K909 Intestinal malabsorption, unspecified: Secondary | ICD-10-CM

## 2020-11-19 DIAGNOSIS — Z5111 Encounter for antineoplastic chemotherapy: Secondary | ICD-10-CM | POA: Diagnosis not present

## 2020-11-19 DIAGNOSIS — Z789 Other specified health status: Secondary | ICD-10-CM

## 2020-11-19 DIAGNOSIS — IMO0001 Reserved for inherently not codable concepts without codable children: Secondary | ICD-10-CM

## 2020-11-19 DIAGNOSIS — Z95828 Presence of other vascular implants and grafts: Secondary | ICD-10-CM

## 2020-11-19 DIAGNOSIS — D500 Iron deficiency anemia secondary to blood loss (chronic): Secondary | ICD-10-CM

## 2020-11-19 LAB — CBC WITH DIFFERENTIAL (CANCER CENTER ONLY)
Abs Immature Granulocytes: 0.02 10*3/uL (ref 0.00–0.07)
Basophils Absolute: 0 10*3/uL (ref 0.0–0.1)
Basophils Relative: 1 %
Eosinophils Absolute: 0 10*3/uL (ref 0.0–0.5)
Eosinophils Relative: 0 %
HCT: 27.2 % — ABNORMAL LOW (ref 36.0–46.0)
Hemoglobin: 8.7 g/dL — ABNORMAL LOW (ref 12.0–15.0)
Immature Granulocytes: 1 %
Lymphocytes Relative: 17 %
Lymphs Abs: 0.6 10*3/uL — ABNORMAL LOW (ref 0.7–4.0)
MCH: 32.2 pg (ref 26.0–34.0)
MCHC: 32 g/dL (ref 30.0–36.0)
MCV: 100.7 fL — ABNORMAL HIGH (ref 80.0–100.0)
Monocytes Absolute: 0.9 10*3/uL (ref 0.1–1.0)
Monocytes Relative: 23 %
Neutro Abs: 2.3 10*3/uL (ref 1.7–7.7)
Neutrophils Relative %: 58 %
Platelet Count: 387 10*3/uL (ref 150–400)
RBC: 2.7 MIL/uL — ABNORMAL LOW (ref 3.87–5.11)
RDW: 19.8 % — ABNORMAL HIGH (ref 11.5–15.5)
WBC Count: 3.8 10*3/uL — ABNORMAL LOW (ref 4.0–10.5)
nRBC: 0.5 % — ABNORMAL HIGH (ref 0.0–0.2)

## 2020-11-19 LAB — CMP (CANCER CENTER ONLY)
ALT: 6 U/L (ref 0–44)
AST: 14 U/L — ABNORMAL LOW (ref 15–41)
Albumin: 3.9 g/dL (ref 3.5–5.0)
Alkaline Phosphatase: 121 U/L (ref 38–126)
Anion gap: 9 (ref 5–15)
BUN: 22 mg/dL (ref 8–23)
CO2: 27 mmol/L (ref 22–32)
Calcium: 9.5 mg/dL (ref 8.9–10.3)
Chloride: 97 mmol/L — ABNORMAL LOW (ref 98–111)
Creatinine: 1.58 mg/dL — ABNORMAL HIGH (ref 0.44–1.00)
GFR, Estimated: 33 mL/min — ABNORMAL LOW (ref >60)
Glucose, Bld: 103 mg/dL — ABNORMAL HIGH (ref 70–99)
Potassium: 3.9 mmol/L (ref 3.5–5.1)
Sodium: 133 mmol/L — ABNORMAL LOW (ref 135–145)
Total Bilirubin: 0.3 mg/dL (ref 0.3–1.2)
Total Protein: 7.5 g/dL (ref 6.5–8.1)

## 2020-11-19 MED ORDER — HEPARIN SOD (PORK) LOCK FLUSH 100 UNIT/ML IV SOLN
500.0000 [IU] | Freq: Once | INTRAVENOUS | Status: AC | PRN
Start: 2020-11-19 — End: 2020-11-19
  Administered 2020-11-19: 500 [IU]
  Filled 2020-11-19: qty 5

## 2020-11-19 MED ORDER — SODIUM CHLORIDE 0.9 % IV SOLN
10.0000 mg | Freq: Once | INTRAVENOUS | Status: AC
  Administered 2020-11-19: 10 mg via INTRAVENOUS
  Filled 2020-11-19: qty 1

## 2020-11-19 MED ORDER — SODIUM CHLORIDE 0.9% FLUSH
10.0000 mL | INTRAVENOUS | Status: DC | PRN
  Administered 2020-11-19: 10 mL
  Filled 2020-11-19: qty 10

## 2020-11-19 MED ORDER — PALONOSETRON HCL INJECTION 0.25 MG/5ML
INTRAVENOUS | Status: AC
  Filled 2020-11-19: qty 5

## 2020-11-19 MED ORDER — PALONOSETRON HCL INJECTION 0.25 MG/5ML
0.2500 mg | Freq: Once | INTRAVENOUS | Status: AC
  Administered 2020-11-19: 0.25 mg via INTRAVENOUS

## 2020-11-19 MED ORDER — SODIUM CHLORIDE 0.9 % IV SOLN
Freq: Once | INTRAVENOUS | Status: AC
  Filled 2020-11-19: qty 250

## 2020-11-19 MED ORDER — SODIUM CHLORIDE 0.9 % IV SOLN
150.0000 mg | Freq: Once | INTRAVENOUS | Status: AC
  Administered 2020-11-19: 150 mg via INTRAVENOUS
  Filled 2020-11-19: qty 150

## 2020-11-19 MED ORDER — SODIUM CHLORIDE 0.9 % IV SOLN
300.0000 mg | Freq: Once | INTRAVENOUS | Status: AC
Start: 2020-11-19 — End: 2020-11-19
  Administered 2020-11-19: 300 mg via INTRAVENOUS
  Filled 2020-11-19: qty 30

## 2020-11-19 MED ORDER — SODIUM CHLORIDE 0.9 % IV SOLN
1400.0000 mg | Freq: Once | INTRAVENOUS | Status: AC
  Administered 2020-11-19: 1400 mg via INTRAVENOUS
  Filled 2020-11-19: qty 26.3

## 2020-11-19 MED ORDER — SODIUM CHLORIDE 0.9 % IV SOLN
Freq: Once | INTRAVENOUS | Status: DC
  Filled 2020-11-19: qty 250

## 2020-11-19 NOTE — Progress Notes (Signed)
Hematology and Oncology Follow Up Visit  Vanessa Garrett [id number] 09/14/1940 80 y.o. 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  She comes in with her son.  Her daughter is on the cell phone.  She actually looks pretty good.  We did go ahead and do a MRI on her abdomen.  Surprisingly, the MRI showed that everything seemed to be pretty stable.  There is a new clinical trial that showed that the addition of immunotherapy to a chemotherapy improves the response rate.  As such, I really think that we should try to add Durvalumab to the chemotherapy.  I do not think that the addition of immunotherapy  would really increase toxicity.  Her hemoglobin is coming up a little bit.  She is on weekly Retacrit.  I really do not think we have to worry about iron right now.  Her quality of life seems to be holding stable.  I know she goes up to [place] with her family quite often.  There is family up in [place].  She is not having any pain.  She is having no obvious bleeding per rectum.  She has had no issues with nausea or vomiting.  I am not sure she really is taking the Marinol that we have given her.  Overall, I would have to say that her performance status is probably ECOG 1.   Medications:  Allergies as of 11/19/2020   No Known Allergies     Medication List       Accurate as of November 19, 2020 12:13 PM. If you have any questions, ask your nurse or doctor.        dexamethasone 4 MG tablet Commonly known as: DECADRON Take 2  tablets (8 mg total) by mouth daily. Start the day after carboplatin chemotherapy for 3 days.   dronabinol 2.5 MG capsule Commonly known as: MARINOL Take 1 capsule (2.5 mg total) by mouth 2 (two) times daily before a meal.   lidocaine-prilocaine cream Commonly known as: EMLA Apply to affected area once   metoprolol tartrate 25 MG tablet Commonly known as: LOPRESSOR TAKE 1 TABLET(25 MG) BY MOUTH TWICE DAILY   NON FORMULARY Take 2 tablets by mouth every other day. [employer] - Blood Builder   NON FORMULARY 1.25 mg every other day. Beet Root Powder   ondansetron 8 MG tablet Commonly known as: Zofran Take 1 tablet (8 mg total) by mouth 2 (two) times daily as needed for refractory nausea / vomiting. Start on day 3 after carboplatin chemo.   potassium chloride SA 20 MEQ tablet Commonly known as: KLOR-CON TAKE 1 TABLET BY MOUTH ONCE DAILY   prochlorperazine 10 MG tablet Commonly known as: COMPAZINE TAKE 1 TABLET(10 MG) BY MOUTH EVERY 6 HOURS AS NEEDED FOR NAUSEA OR VOMITING   Retacrit 10000 UNIT/ML injection Generic drug: epoetin alfa-epbx Inject 1 mL (10,000 Units total) into the skin 3 (three) times a week.   S.S.S. TONIC PO Take 45 mLs by mouth every other day.   triamterene-hydrochlorothiazide 75-50 MG tablet Commonly known as: Maxzide Take 1 tablet  by mouth daily.   vitamin B-12 1000 MCG tablet Commonly known as: CYANOCOBALAMIN Take 1,000 mcg by mouth daily.   VITAMIN D PO Take 2 tablets by mouth daily.       Allergies: No Known Allergies  Past Medical History, Surgical history, Social history, and Family History were reviewed and updated.  Review of Systems: Review of Systems  Constitutional: Positive for malaise/fatigue and weight loss.  HENT: Negative.   Eyes: Negative.   Respiratory: Negative.   Cardiovascular: Positive for leg swelling.  Gastrointestinal: Positive for nausea.  Genitourinary: Negative.   Musculoskeletal: Negative.   Skin: Negative.    Neurological: Negative.   Endo/Heme/Allergies: Negative.   Psychiatric/Behavioral: Negative.    Marland Kitchen   Physical Exam:  weight is 151 lb (68.5 kg). Her oral temperature is 98.4 F (36.9 C). Her blood pressure is 171/81 (abnormal) and her pulse is 67. Her respiration is 18 and oxygen saturation is 100%.   Wt Readings from Last 3 Encounters:  11/19/20 151 lb (68.5 kg)  10/28/20 154 lb 6.4 oz (70 kg)  10/22/20 151 lb (68.5 kg)    Physical Exam Vitals reviewed.  HENT:     Head: Normocephalic and atraumatic.  Eyes:     Pupils: Pupils are equal, round, and reactive to light.  Cardiovascular:     Rate and Rhythm: Normal rate and regular rhythm.     Heart sounds: Normal heart sounds.  Pulmonary:     Effort: Pulmonary effort is normal.     Breath sounds: Normal breath sounds.  Abdominal:     General: Bowel sounds are normal.     Palpations: Abdomen is soft.  Musculoskeletal:        General: No tenderness or deformity. Normal range of motion.     Cervical back: Normal range of motion.     Comments: Her extremities does show some swelling in the legs.  There might be a little bit more swelling in the right leg than in the left leg.  Lymphadenopathy:     Cervical: No cervical adenopathy.  Skin:    General: Skin is warm and dry.     Findings: No erythema or rash.  Neurological:     Mental Status: She is alert and oriented to person, place, and time.  Psychiatric:        Behavior: Behavior normal.        Thought Content: Thought content normal.        Judgment: Judgment normal.      Lab Results  Component Value Date   WBC 3.8 (L) 11/19/2020   HGB 8.7 (L) 11/19/2020   HCT 27.2 (L) 11/19/2020   MCV 100.7 (H) 11/19/2020   PLT 387 11/19/2020   Lab Results  Component Value Date   FERRITIN 2,486 (H) 10/28/2020   IRON 239 (H) 10/28/2020   TIBC 219 (L) 10/28/2020   UIBC UNABLE TO CALCULATE 10/28/2020   IRONPCTSAT 109 (H) 10/28/2020   Lab Results  Component Value Date    RETICCTPCT <0.4 (L) 10/28/2020   RBC 2.70 (L) 11/19/2020   No results found for: KPAFRELGTCHN, LAMBDASER, KAPLAMBRATIO No results found for: IGGSERUM, IGA, IGMSERUM No results found for: TOTALPROTELP, ALBUMINELP, A1GS, A2GS, BETS, BETA2SER, GAMS, MSPIKE, SPEI   Chemistry      Component Value Date/Time   NA 133 (L) 11/19/2020 1031   K 3.9 11/19/2020 1031   CL 97 (L) 11/19/2020 1031   CO2 27 11/19/2020 1031   BUN 22 11/19/2020 1031   CREATININE  1.58 (H) 11/19/2020 1031      Component Value Date/Time   CALCIUM 9.5 11/19/2020 1031   ALKPHOS 121 11/19/2020 1031   AST 14 (L) 11/19/2020 1031   ALT 6 11/19/2020 1031   BILITOT 0.3 11/19/2020 1031       Impression and Plan: Vanessa Garrett is a very pleasant 80 yo African American female with stage IV adenocarcinoma of the gallbladder.   I know that she was off treatment for a while because she had Covid.  We got her back on treatment.  Again the MRI seems to show everything is relatively stable.  Maybe, with the addition of immunotherapy, we will be able to improve the response rate.  I hope that insurance will allow [place] to use immunotherapy.  Again this clinical trial did not show a nice improvement in the response rate.  We will continue her on the Retacrit weekly.  She will start her sixth cycle of treatment today.  I will plan to see her back for the start of the seventh cycle of treatment in April.  Maybe, we can try to coordinate treatment so that she will not be treated Easter week.    Volanda Napoleon, MD 3/25/202212:13 PM

## 2020-11-19 NOTE — Patient Instructions (Signed)
Gemcitabine injection What is this medicine? GEMCITABINE (jem SYE ta been) is a chemotherapy drug. This medicine is used to treat many types of cancer like breast cancer, lung cancer, pancreatic cancer, and ovarian cancer. This medicine may be used for other purposes; ask your health care provider or pharmacist if you have questions. COMMON BRAND NAME(S): Gemzar, Infugem What should I tell my health care provider before I take this medicine? They need to know if you have any of these conditions:  blood disorders  infection  kidney disease  liver disease  lung or breathing disease, like asthma  recent or ongoing radiation therapy  an unusual or allergic reaction to gemcitabine, other chemotherapy, other medicines, foods, dyes, or preservatives  pregnant or trying to get pregnant  breast-feeding How should I use this medicine? This drug is given as an infusion into a vein. It is administered in a hospital or clinic by a specially trained health care professional. Talk to your pediatrician regarding the use of this medicine in children. Special care may be needed. Overdosage: If you think you have taken too much of this medicine contact a poison control center or emergency room at once. NOTE: This medicine is only for you. Do not share this medicine with others. What if I miss a dose? It is important not to miss your dose. Call your doctor or health care professional if you are unable to keep an appointment. What may interact with this medicine?  medicines to increase blood counts like filgrastim, pegfilgrastim, sargramostim  some other chemotherapy drugs like cisplatin  vaccines Talk to your doctor or health care professional before taking any of these medicines:  acetaminophen  aspirin  ibuprofen  ketoprofen  naproxen This list may not describe all possible interactions. Give your health care provider a list of all the medicines, herbs, non-prescription drugs, or  dietary supplements you use. Also tell them if you smoke, drink alcohol, or use illegal drugs. Some items may interact with your medicine. What should I watch for while using this medicine? Visit your doctor for checks on your progress. This drug may make you feel generally unwell. This is not uncommon, as chemotherapy can affect healthy cells as well as cancer cells. Report any side effects. Continue your course of treatment even though you feel ill unless your doctor tells you to stop. In some cases, you may be given additional medicines to help with side effects. Follow all directions for their use. Call your doctor or health care professional for advice if you get a fever, chills or sore throat, or other symptoms of a cold or flu. Do not treat yourself. This drug decreases your body's ability to fight infections. Try to avoid being around people who are sick. This medicine may increase your risk to bruise or bleed. Call your doctor or health care professional if you notice any unusual bleeding. Be careful brushing and flossing your teeth or using a toothpick because you may get an infection or bleed more easily. If you have any dental work done, tell your dentist you are receiving this medicine. Avoid taking products that contain aspirin, acetaminophen, ibuprofen, naproxen, or ketoprofen unless instructed by your doctor. These medicines may hide a fever. Do not become pregnant while taking this medicine or for 6 months after stopping it. Women should inform their doctor if they wish to become pregnant or think they might be pregnant. Men should not father a child while taking this medicine and for 3 months after stopping it.   There is a potential for serious side effects to an unborn child. Talk to your health care professional or pharmacist for more information. Do not breast-feed an infant while taking this medicine or for at least 1 week after stopping it. Men should inform their doctors if they wish  to father a child. This medicine may lower sperm counts. Talk with your doctor or health care professional if you are concerned about your fertility. What side effects may I notice from receiving this medicine? Side effects that you should report to your doctor or health care professional as soon as possible:  allergic reactions like skin rash, itching or hives, swelling of the face, lips, or tongue  breathing problems  pain, redness, or irritation at site where injected  signs and symptoms of a dangerous change in heartbeat or heart rhythm like chest pain; dizziness; fast or irregular heartbeat; palpitations; feeling faint or lightheaded, falls; breathing problems  signs of decreased platelets or bleeding - bruising, pinpoint red spots on the skin, black, tarry stools, blood in the urine  signs of decreased red blood cells - unusually weak or tired, feeling faint or lightheaded, falls  signs of infection - fever or chills, cough, sore throat, pain or difficulty passing urine  signs and symptoms of kidney injury like trouble passing urine or change in the amount of urine  signs and symptoms of liver injury like dark yellow or brown urine; general ill feeling or flu-like symptoms; light-colored stools; loss of appetite; nausea; right upper belly pain; unusually weak or tired; yellowing of the eyes or skin  swelling of ankles, feet, hands Side effects that usually do not require medical attention (report to your doctor or health care professional if they continue or are bothersome):  constipation  diarrhea  hair loss  loss of appetite  nausea  rash  vomiting This list may not describe all possible side effects. Call your doctor for medical advice about side effects. You may report side effects to FDA at 1-800-FDA-1088. Where should I keep my medicine? This drug is given in a hospital or clinic and will not be stored at home. NOTE: This sheet is a summary. It may not cover all  possible information. If you have questions about this medicine, talk to your doctor, pharmacist, or health care provider.  2021 Elsevier/Gold Standard (2017-11-07 18:06:11)  

## 2020-11-19 NOTE — Patient Instructions (Signed)
Tunneled Central Venous Catheter Flushing Guide  It is important to flush your tunneled central venous catheter each time you use it, both before and after you use it. Flushing your catheter will help prevent it from clogging. What are the risks? Risks may include:  Infection.  Air getting into the catheter and bloodstream. Supplies needed:  A clean pair of gloves.  A disinfecting wipe. Use an alcohol wipe, chlorhexidine wipe, or iodine wipe as told by your health care provider.  A 10 mL syringe that has been prefilled with saline solution.  An empty 10 mL syringe, if a substance called heparin was injected into your catheter. How to flush your catheter When you flush your catheter, make sure you follow any specific instructions from your health care provider or the manufacturer. These are general guidelines. Flushing your catheter before use If there is heparin in your catheter: 1. Wash your hands with soap and water. 2. Put on gloves. 3. Scrub the injection cap for a minimum of 15 seconds with a disinfecting wipe. 4. Unclamp the catheter. 5. Attach the empty syringe to the injection cap. 6. Pull the syringe plunger back and withdraw 10 mL of blood. 7. Place the syringe into an appropriate waste container. 8. Scrub the injection cap for 15 seconds with a disinfecting wipe. 9. Attach the prefilled syringe to the injection cap. 10. Flush the catheter by pushing the plunger forward until all the liquid from the syringe is in the catheter. 11. Remove the syringe from the injection cap. 12. Clamp the catheter. If there is no heparin in your catheter: 1. Wash your hands with soap and water. 2. Put on gloves. 3. Scrub the injection cap for 15 seconds with a disinfecting wipe. 4. Unclamp the catheter. 5. Attach the prefilled syringe to the injection cap. 6. Flush the catheter by pushing the plunger forward until 5 mL of the liquid from the syringe is in the catheter. 7. Pull back on  the syringe until you see blood in the catheter. 8. If you have been asked to collect any blood, follow your health care provider's instructions. Otherwise, flush the catheter with the rest of the solution from the syringe. 9. Remove the syringe from the injection cap. 10. Clamp the catheter.   Flushing your catheter after use 1. Wash your hands with soap and water. 2. Put on gloves. 3. Scrub the injection cap for 15 seconds with a disinfecting wipe. 4. Unclamp the catheter. 5. Attach the prefilled syringe to the injection cap. 6. Flush the catheter by pushing the plunger forward until all of the liquid from the syringe is in the catheter. 7. Remove the syringe from the injection cap. 8. Clamp the catheter. Problems and solutions  If blood cannot be completely cleared from the injection cap, you may need to have the injection cap replaced.  If the catheter is difficult to flush, use the pulsing method. The pulsing method involves pushing only a few milliliters of solution into the catheter at a time and pausing between pushes.  If you do not see blood in the catheter when you pull back on the syringe, change your body position, such as by raising your arms above your head. Take a deep breath and cough. Then, pull back on the syringe. If you still do not see blood, flush the catheter with a small amount of solution. Then, change positions again and take a breath or cough. Pull back on the syringe again. If you still do not   see blood, finish flushing the catheter and contact your health care provider. Do not use your catheter until your health care provider says it is okay. General tips  Have someone help you flush your catheter, if possible.  Do not force fluid through your catheter.  Do not use a syringe that is larger or smaller than 10 mL. Using a smaller syringe can make the catheter burst.  Do not use your catheter without flushing it first if it has heparin in it. Contact a health  care provider if:  You cannot see any blood in the catheter when you flush it before using it.  Your catheter is difficult to flush. Get help right away if:  You cannot flush the catheter.  The catheter leaks when you flush it or when there is fluid in it.  There are cracks or breaks in the catheter. Summary  It is important to flush your tunneled central venous catheter each time you use it, both before and after you use it.  Scrub the injection cap for 15 seconds with a disinfecting wipe before and after you flush it.  When you flush your catheter, make sure you follow any specific instructions from your health care provider or the manufacturer.  Get help right away if you cannot flush the catheter. This information is not intended to replace advice given to you by your health care provider. Make sure you discuss any questions you have with your health care provider. Document Revised: 10/23/2019 Document Reviewed: 10/30/2018 Elsevier Patient Education  2021 [employer] Inc.  

## 2020-11-20 LAB — CANCER ANTIGEN 19-9: CA 19-9: 2371 U/mL — ABNORMAL HIGH (ref 0–35)

## 2020-11-22 ENCOUNTER — Telehealth: Payer: Self-pay

## 2020-11-22 LAB — IRON AND TIBC
Iron: 31 ug/dL — ABNORMAL LOW (ref 41–142)
Saturation Ratios: 12 % — ABNORMAL LOW (ref 21–57)
TIBC: 251 ug/dL (ref 236–444)
UIBC: 220 ug/dL (ref 120–384)

## 2020-11-22 LAB — FERRITIN: Ferritin: 1418 ng/mL — ABNORMAL HIGH (ref 11–307)

## 2020-11-22 NOTE — Progress Notes (Signed)
Oncology Nurse Navigator Documentation  Oncology Nurse Navigator Flowsheets 11/19/2020  Abnormal Finding Date -  Planned Course of Treatment -  Phase of Treatment -  Chemotherapy Actual Start Date: -  Navigator Follow Up Date: 12/17/2020  Navigator Follow Up Reason: Follow-up Appointment;Chemotherapy  Navigator Location [facility]  Referral Date to RadOnc/MedOnc -  Navigator Encounter Type Follow-up Appt  Telephone -  Treatment Initiated Date -  Patient Visit Type MedOnc  Treatment Phase Active Tx  Barriers/Navigation Needs Coordination of Care;Education  Education -  Interventions None Required  Acuity Level 2-Minimal Needs (1-2 Barriers Identified)  Referrals -  Coordination of Care -  Education Method -  Support Groups/Services Friends and Family  Time Spent with Patient 15

## 2020-11-22 NOTE — Telephone Encounter (Signed)
Called and left a vm with 4/22 appt  Per los   anne

## 2020-11-26 ENCOUNTER — Other Ambulatory Visit: Payer: Self-pay | Admitting: *Deleted

## 2020-11-26 ENCOUNTER — Inpatient Hospital Stay: Payer: Medicare PPO

## 2020-11-26 ENCOUNTER — Inpatient Hospital Stay: Payer: Medicare PPO | Attending: Hematology & Oncology

## 2020-11-26 ENCOUNTER — Other Ambulatory Visit: Payer: Self-pay

## 2020-11-26 ENCOUNTER — Ambulatory Visit: Payer: Medicare PPO

## 2020-11-26 VITALS — BP 151/54 | HR 65 | Temp 98.0°F | Resp 20

## 2020-11-26 DIAGNOSIS — D509 Iron deficiency anemia, unspecified: Secondary | ICD-10-CM | POA: Diagnosis not present

## 2020-11-26 DIAGNOSIS — Z5112 Encounter for antineoplastic immunotherapy: Secondary | ICD-10-CM | POA: Insufficient documentation

## 2020-11-26 DIAGNOSIS — R160 Hepatomegaly, not elsewhere classified: Secondary | ICD-10-CM

## 2020-11-26 DIAGNOSIS — C787 Secondary malignant neoplasm of liver and intrahepatic bile duct: Secondary | ICD-10-CM | POA: Diagnosis not present

## 2020-11-26 DIAGNOSIS — Z5111 Encounter for antineoplastic chemotherapy: Secondary | ICD-10-CM | POA: Diagnosis present

## 2020-11-26 DIAGNOSIS — R978 Other abnormal tumor markers: Secondary | ICD-10-CM | POA: Insufficient documentation

## 2020-11-26 DIAGNOSIS — C23 Malignant neoplasm of gallbladder: Secondary | ICD-10-CM | POA: Diagnosis present

## 2020-11-26 DIAGNOSIS — Z79899 Other long term (current) drug therapy: Secondary | ICD-10-CM | POA: Insufficient documentation

## 2020-11-26 DIAGNOSIS — D500 Iron deficiency anemia secondary to blood loss (chronic): Secondary | ICD-10-CM

## 2020-11-26 DIAGNOSIS — Z95828 Presence of other vascular implants and grafts: Secondary | ICD-10-CM

## 2020-11-26 LAB — CBC WITH DIFFERENTIAL (CANCER CENTER ONLY)
Abs Immature Granulocytes: 0.01 10*3/uL (ref 0.00–0.07)
Basophils Absolute: 0 10*3/uL (ref 0.0–0.1)
Basophils Relative: 2 %
Eosinophils Absolute: 0 10*3/uL (ref 0.0–0.5)
Eosinophils Relative: 0 %
HCT: 27.1 % — ABNORMAL LOW (ref 36.0–46.0)
Hemoglobin: 8.7 g/dL — ABNORMAL LOW (ref 12.0–15.0)
Immature Granulocytes: 0 %
Lymphocytes Relative: 23 %
Lymphs Abs: 0.6 10*3/uL — ABNORMAL LOW (ref 0.7–4.0)
MCH: 31.5 pg (ref 26.0–34.0)
MCHC: 32.1 g/dL (ref 30.0–36.0)
MCV: 98.2 fL (ref 80.0–100.0)
Monocytes Absolute: 0.2 10*3/uL (ref 0.1–1.0)
Monocytes Relative: 8 %
Neutro Abs: 1.6 10*3/uL — ABNORMAL LOW (ref 1.7–7.7)
Neutrophils Relative %: 67 %
Platelet Count: 217 10*3/uL (ref 150–400)
RBC: 2.76 MIL/uL — ABNORMAL LOW (ref 3.87–5.11)
RDW: 17.1 % — ABNORMAL HIGH (ref 11.5–15.5)
WBC Count: 2.5 10*3/uL — ABNORMAL LOW (ref 4.0–10.5)
nRBC: 0 % (ref 0.0–0.2)

## 2020-11-26 LAB — CMP (CANCER CENTER ONLY)
ALT: 11 U/L (ref 0–44)
AST: 18 U/L (ref 15–41)
Albumin: 3.8 g/dL (ref 3.5–5.0)
Alkaline Phosphatase: 122 U/L (ref 38–126)
Anion gap: 7 (ref 5–15)
BUN: 26 mg/dL — ABNORMAL HIGH (ref 8–23)
CO2: 27 mmol/L (ref 22–32)
Calcium: 9.1 mg/dL (ref 8.9–10.3)
Chloride: 96 mmol/L — ABNORMAL LOW (ref 98–111)
Creatinine: 1.63 mg/dL — ABNORMAL HIGH (ref 0.44–1.00)
GFR, Estimated: 32 mL/min — ABNORMAL LOW (ref >60)
Glucose, Bld: 116 mg/dL — ABNORMAL HIGH (ref 70–99)
Potassium: 3.7 mmol/L (ref 3.5–5.1)
Sodium: 130 mmol/L — ABNORMAL LOW (ref 135–145)
Total Bilirubin: 0.3 mg/dL (ref 0.3–1.2)
Total Protein: 7.1 g/dL (ref 6.5–8.1)

## 2020-11-26 MED ORDER — LIDOCAINE-PRILOCAINE 2.5-2.5 % EX CREA: TOPICAL_CREAM | CUTANEOUS | 3 refills | Status: DC

## 2020-11-26 MED ORDER — SODIUM CHLORIDE 0.9 % IV SOLN
10.0000 mg/kg | Freq: Once | INTRAVENOUS | Status: AC
  Administered 2020-11-26: 740 mg via INTRAVENOUS
  Filled 2020-11-26: qty 4.8

## 2020-11-26 MED ORDER — SODIUM CHLORIDE 0.9 % IV SOLN
1400.0000 mg | Freq: Once | INTRAVENOUS | Status: AC
  Administered 2020-11-26: 1400 mg via INTRAVENOUS
  Filled 2020-11-26: qty 26.3

## 2020-11-26 MED ORDER — LORAZEPAM 0.5 MG PO TABS: 0.5000 mg | ORAL_TABLET | Freq: Four times a day (QID) | ORAL | 0 refills | Status: DC | PRN

## 2020-11-26 MED ORDER — SODIUM CHLORIDE 0.9 % IV SOLN
Freq: Once | INTRAVENOUS | Status: AC
  Filled 2020-11-26: qty 250

## 2020-11-26 MED ORDER — SODIUM CHLORIDE 0.9% FLUSH
10.0000 mL | INTRAVENOUS | Status: DC | PRN
  Administered 2020-11-26: 10 mL
  Filled 2020-11-26: qty 10

## 2020-11-26 MED ORDER — HEPARIN SOD (PORK) LOCK FLUSH 100 UNIT/ML IV SOLN
500.0000 [IU] | Freq: Once | INTRAVENOUS | Status: AC | PRN
  Administered 2020-11-26: 500 [IU]
  Filled 2020-11-26: qty 5

## 2020-11-26 MED ORDER — SODIUM CHLORIDE 0.9 % IV SOLN
Freq: Once | INTRAVENOUS | Status: DC
  Filled 2020-11-26: qty 250

## 2020-11-26 MED ORDER — PROCHLORPERAZINE MALEATE 10 MG PO TABS: 10.0000 mg | ORAL_TABLET | Freq: Four times a day (QID) | ORAL | 1 refills | Status: DC | PRN

## 2020-11-26 MED ORDER — PROCHLORPERAZINE MALEATE 10 MG PO TABS
ORAL_TABLET | ORAL | Status: AC
  Filled 2020-11-26: qty 1

## 2020-11-26 MED ORDER — DRONABINOL 2.5 MG PO CAPS: 2.5000 mg | ORAL_CAPSULE | Freq: Two times a day (BID) | ORAL | 0 refills | Status: DC

## 2020-11-26 MED ORDER — ONDANSETRON HCL 8 MG PO TABS: 8.0000 mg | ORAL_TABLET | Freq: Two times a day (BID) | ORAL | 1 refills | Status: DC | PRN

## 2020-11-26 MED ORDER — PROCHLORPERAZINE MALEATE 10 MG PO TABS
10.0000 mg | ORAL_TABLET | Freq: Once | ORAL | Status: AC
  Administered 2020-11-26: 10 mg via ORAL

## 2020-11-26 NOTE — Patient Instructions (Addendum)
[facility] Discharge Instructions for Patients Receiving Chemotherapy  Today you received the following chemotherapy agents Gemzar  To help prevent nausea and vomiting after your treatment, we encourage you to take your nausea medication as prescribed by MD.   If you develop nausea and vomiting that is not controlled by your nausea medication, call the clinic.   BELOW ARE SYMPTOMS THAT SHOULD BE REPORTED IMMEDIATELY:  *FEVER GREATER THAN 100.5 F  *CHILLS WITH OR WITHOUT FEVER  NAUSEA AND VOMITING THAT IS NOT CONTROLLED WITH YOUR NAUSEA MEDICATION  *UNUSUAL SHORTNESS OF BREATH  *UNUSUAL BRUISING OR BLEEDING  TENDERNESS IN MOUTH AND THROAT WITH OR WITHOUT PRESENCE OF ULCERS  *URINARY PROBLEMS  *BOWEL PROBLEMS  UNUSUAL RASH Items with * indicate a potential emergency and should be followed up as soon as possible.  Feel free to call the clinic should you have any questions or concerns. The clinic phone number is [phone].  Please show the [facility] at check-in to the Emergency Department and triage nurse.  Durvalumab injection What is this medicine? DURVALUMAB (dur VAL ue mab) is a monoclonal antibody. It is used to treat lung cancer. This medicine may be used for other purposes; ask your health care provider or pharmacist if you have questions. COMMON BRAND NAME(S): IMFINZI What should I tell my health care provider before I take this medicine? They need to know if you have any of these conditions:  autoimmune diseases like Crohn's disease, ulcerative colitis, or lupus  have had or planning to have an allogeneic stem cell transplant (uses someone else's stem cells)  history of organ transplant  history of radiation to the chest  nervous system problems like myasthenia gravis or Guillain-Barre syndrome  an unusual or allergic reaction to durvalumab, other medicines, foods, dyes, or preservatives  pregnant or trying to get  pregnant  breast-feeding How should I use this medicine? This medicine is for infusion into a vein. It is given by a health care professional in a hospital or clinic setting. A special MedGuide will be given to you before each treatment. Be sure to read this information carefully each time. Talk to your pediatrician regarding the use of this medicine in children. Special care may be needed. Overdosage: If you think you have taken too much of this medicine contact a poison control center or emergency room at once. NOTE: This medicine is only for you. Do not share this medicine with others. What if I miss a dose? It is important not to miss your dose. Call your doctor or health care professional if you are unable to keep an appointment. What may interact with this medicine? Interactions have not been studied. This list may not describe all possible interactions. Give your health care provider a list of all the medicines, herbs, non-prescription drugs, or dietary supplements you use. Also tell them if you smoke, drink alcohol, or use illegal drugs. Some items may interact with your medicine. What should I watch for while using this medicine? This drug may make you feel generally unwell. Continue your course of treatment even though you feel ill unless your doctor tells you to stop. You may need blood work done while you are taking this medicine. Do not become pregnant while taking this medicine or for 3 months after stopping it. Women should inform their doctor if they wish to become pregnant or think they might be pregnant. There is a potential for serious side effects to an unborn child. Talk to  your health care professional or pharmacist for more information. Do not breast-feed an infant while taking this medicine or for 3 months after stopping it. What side effects may I notice from receiving this medicine? Side effects that you should report to your doctor or health care professional as soon as  possible:  allergic reactions like skin rash, itching or hives, swelling of the face, lips, or tongue  black, tarry stools  bloody or watery diarrhea  breathing problems  change in emotions or moods  change in sex drive  changes in vision  chest pain or chest tightness  chills  confusion  cough  facial flushing  fever  headache  signs and symptoms of high blood sugar such as dizziness; dry mouth; dry skin; fruity breath; nausea; stomach pain; increased hunger or thirst; increased urination  signs and symptoms of liver injury like dark yellow or brown urine; general ill feeling or flu-like symptoms; light-colored stools; loss of appetite; nausea; right upper belly pain; unusually weak or tired; yellowing of the eyes or skin  stomach pain  trouble passing urine or change in the amount of urine  weight gain or weight loss Side effects that usually do not require medical attention (report these to your doctor or health care professional if they continue or are bothersome):  bone pain  constipation  loss of appetite  muscle pain  nausea  swelling of the ankles, feet, hands  tiredness This list may not describe all possible side effects. Call your doctor for medical advice about side effects. You may report side effects to FDA at 1-800-FDA-1088. Where should I keep my medicine? This drug is given in a hospital or clinic and will not be stored at home. NOTE: This sheet is a summary. It may not cover all possible information. If you have questions about this medicine, talk to your doctor, pharmacist, or health care provider.  2021 Elsevier/Gold Standard (2019-10-23 13:01:29)

## 2020-11-26 NOTE — Progress Notes (Signed)
OK to treat with creat-1.63 per order of Dr. Marin Olp.

## 2020-12-08 ENCOUNTER — Other Ambulatory Visit: Payer: Self-pay | Admitting: Family

## 2020-12-15 ENCOUNTER — Encounter: Payer: Self-pay | Admitting: *Deleted

## 2020-12-15 ENCOUNTER — Other Ambulatory Visit: Payer: Self-pay

## 2020-12-15 DIAGNOSIS — D500 Iron deficiency anemia secondary to blood loss (chronic): Secondary | ICD-10-CM

## 2020-12-15 DIAGNOSIS — C23 Malignant neoplasm of gallbladder: Secondary | ICD-10-CM

## 2020-12-15 MED ORDER — MEGESTROL ACETATE 400 MG/10ML PO SUSP: 400.0000 mg | Freq: Two times a day (BID) | ORAL | 0 refills | Status: DC

## 2020-12-15 NOTE — Progress Notes (Signed)
Patient appetite is still poor despite marinol BID. Patient's daughter, Vanessa Garrett would like to know if there is another option for appetite stimulation. Spoke with Dr Marin Olp and he will send Megace to her pharmacy.  Vanessa Garrett is aware of new prescription, possibility for need for PA and pharmacy confirmed.   Oncology Nurse Navigator Documentation  Oncology Nurse Navigator Flowsheets 12/15/2020  Abnormal Finding Date -  Planned Course of Treatment -  Phase of Treatment -  Chemotherapy Actual Start Date: -  Navigator Follow Up Date: 12/17/2020  Navigator Follow Up Reason: -  Navigator Location [facility]  Referral Date to RadOnc/MedOnc -  Navigator Encounter Type Telephone  Telephone Medication Assistance;Incoming Call  Treatment Initiated Date -  Patient Visit Type MedOnc  Treatment Phase Active Tx  Barriers/Navigation Needs Coordination of Care;Education  Education Other  Interventions Medication Assistance;Psycho-Social Support;Education  Acuity Level 2-Minimal Needs (1-2 Barriers Identified)  Referrals -  Coordination of Care Other  Education Method Verbal  Support Groups/Services Friends and Family  Time Spent with Patient 58

## 2020-12-17 ENCOUNTER — Encounter: Payer: Self-pay | Admitting: *Deleted

## 2020-12-17 ENCOUNTER — Inpatient Hospital Stay (HOSPITAL_BASED_OUTPATIENT_CLINIC_OR_DEPARTMENT_OTHER): Payer: Medicare PPO | Admitting: Hematology & Oncology

## 2020-12-17 ENCOUNTER — Inpatient Hospital Stay: Payer: Medicare PPO

## 2020-12-17 ENCOUNTER — Encounter: Payer: Self-pay | Admitting: Hematology & Oncology

## 2020-12-17 ENCOUNTER — Other Ambulatory Visit: Payer: Self-pay

## 2020-12-17 VITALS — BP 136/86 | HR 73 | Temp 97.3°F | Resp 19 | Wt 151.0 lb

## 2020-12-17 DIAGNOSIS — D500 Iron deficiency anemia secondary to blood loss (chronic): Secondary | ICD-10-CM

## 2020-12-17 DIAGNOSIS — D631 Anemia in chronic kidney disease: Secondary | ICD-10-CM

## 2020-12-17 DIAGNOSIS — IMO0001 Reserved for inherently not codable concepts without codable children: Secondary | ICD-10-CM

## 2020-12-17 DIAGNOSIS — C23 Malignant neoplasm of gallbladder: Secondary | ICD-10-CM

## 2020-12-17 DIAGNOSIS — R160 Hepatomegaly, not elsewhere classified: Secondary | ICD-10-CM

## 2020-12-17 DIAGNOSIS — Z789 Other specified health status: Secondary | ICD-10-CM

## 2020-12-17 DIAGNOSIS — Z5112 Encounter for antineoplastic immunotherapy: Secondary | ICD-10-CM | POA: Diagnosis not present

## 2020-12-17 LAB — TSH: TSH: 2.281 u[IU]/mL (ref 0.308–3.960)

## 2020-12-17 LAB — CMP (CANCER CENTER ONLY)
ALT: 7 U/L (ref 0–44)
AST: 18 U/L (ref 15–41)
Albumin: 3.5 g/dL (ref 3.5–5.0)
Alkaline Phosphatase: 114 U/L (ref 38–126)
Anion gap: 9 (ref 5–15)
BUN: 40 mg/dL — ABNORMAL HIGH (ref 8–23)
CO2: 24 mmol/L (ref 22–32)
Calcium: 9.2 mg/dL (ref 8.9–10.3)
Chloride: 91 mmol/L — ABNORMAL LOW (ref 98–111)
Creatinine: 2.15 mg/dL — ABNORMAL HIGH (ref 0.44–1.00)
GFR, Estimated: 23 mL/min — ABNORMAL LOW (ref >60)
Glucose, Bld: 96 mg/dL (ref 70–99)
Potassium: 3.7 mmol/L (ref 3.5–5.1)
Sodium: 124 mmol/L — ABNORMAL LOW (ref 135–145)
Total Bilirubin: 0.4 mg/dL (ref 0.3–1.2)
Total Protein: 6.9 g/dL (ref 6.5–8.1)

## 2020-12-17 LAB — CBC WITH DIFFERENTIAL (CANCER CENTER ONLY)
Abs Immature Granulocytes: 0.03 10*3/uL (ref 0.00–0.07)
Basophils Absolute: 0 10*3/uL (ref 0.0–0.1)
Basophils Relative: 0 %
Eosinophils Absolute: 0 10*3/uL (ref 0.0–0.5)
Eosinophils Relative: 0 %
HCT: 27.9 % — ABNORMAL LOW (ref 36.0–46.0)
Hemoglobin: 9.1 g/dL — ABNORMAL LOW (ref 12.0–15.0)
Immature Granulocytes: 1 %
Lymphocytes Relative: 13 %
Lymphs Abs: 0.6 10*3/uL — ABNORMAL LOW (ref 0.7–4.0)
MCH: 30.5 pg (ref 26.0–34.0)
MCHC: 32.6 g/dL (ref 30.0–36.0)
MCV: 93.6 fL (ref 80.0–100.0)
Monocytes Absolute: 1.4 10*3/uL — ABNORMAL HIGH (ref 0.1–1.0)
Monocytes Relative: 31 %
Neutro Abs: 2.5 10*3/uL (ref 1.7–7.7)
Neutrophils Relative %: 55 %
Platelet Count: 268 10*3/uL (ref 150–400)
RBC: 2.98 MIL/uL — ABNORMAL LOW (ref 3.87–5.11)
RDW: 17.4 % — ABNORMAL HIGH (ref 11.5–15.5)
WBC Count: 4.6 10*3/uL (ref 4.0–10.5)
nRBC: 0 % (ref 0.0–0.2)

## 2020-12-17 LAB — IRON AND TIBC
Iron: 29 ug/dL — ABNORMAL LOW (ref 41–142)
Saturation Ratios: 15 % — ABNORMAL LOW (ref 21–57)
TIBC: 193 ug/dL — ABNORMAL LOW (ref 236–444)
UIBC: 164 ug/dL (ref 120–384)

## 2020-12-17 LAB — CEA (IN HOUSE-CHCC): CEA (CHCC-In House): 3.24 ng/mL (ref 0.00–5.00)

## 2020-12-17 LAB — RETICULOCYTES
Immature Retic Fract: 26.5 % — ABNORMAL HIGH (ref 2.3–15.9)
RBC.: 2.93 MIL/uL — ABNORMAL LOW (ref 3.87–5.11)
Retic Count, Absolute: 68.9 10*3/uL (ref 19.0–186.0)
Retic Ct Pct: 2.4 % (ref 0.4–3.1)

## 2020-12-17 LAB — FERRITIN: Ferritin: 1971 ng/mL — ABNORMAL HIGH (ref 11–307)

## 2020-12-17 MED ORDER — SODIUM CHLORIDE 0.9 % IV SOLN
300.0000 mg | Freq: Once | INTRAVENOUS | Status: AC
  Administered 2020-12-17: 300 mg via INTRAVENOUS
  Filled 2020-12-17: qty 30

## 2020-12-17 MED ORDER — SODIUM CHLORIDE 0.9% FLUSH
10.0000 mL | INTRAVENOUS | Status: DC | PRN
  Administered 2020-12-17: 10 mL
  Filled 2020-12-17: qty 10

## 2020-12-17 MED ORDER — SODIUM CHLORIDE 0.9 % IV SOLN
Freq: Once | INTRAVENOUS | Status: AC
Start: 2020-12-17 — End: 2020-12-17
  Filled 2020-12-17: qty 250

## 2020-12-17 MED ORDER — SODIUM CHLORIDE 0.9 % IV SOLN
Freq: Once | INTRAVENOUS | Status: AC
  Filled 2020-12-17: qty 250

## 2020-12-17 MED ORDER — DURVALUMAB 500 MG/10ML IV SOLN
1500.0000 mg | Freq: Once | INTRAVENOUS | Status: AC
  Administered 2020-12-17: 1500 mg via INTRAVENOUS
  Filled 2020-12-17: qty 30

## 2020-12-17 MED ORDER — PALONOSETRON HCL INJECTION 0.25 MG/5ML
INTRAVENOUS | Status: AC
  Filled 2020-12-17: qty 5

## 2020-12-17 MED ORDER — SODIUM CHLORIDE 0.9 % IV SOLN
1400.0000 mg | Freq: Once | INTRAVENOUS | Status: AC
  Administered 2020-12-17: 1400 mg via INTRAVENOUS
  Filled 2020-12-17: qty 26.3

## 2020-12-17 MED ORDER — PALONOSETRON HCL INJECTION 0.25 MG/5ML
0.2500 mg | Freq: Once | INTRAVENOUS | Status: AC
  Administered 2020-12-17: 0.25 mg via INTRAVENOUS

## 2020-12-17 MED ORDER — SODIUM CHLORIDE 0.9 % IV SOLN
150.0000 mg | Freq: Once | INTRAVENOUS | Status: AC
  Administered 2020-12-17: 150 mg via INTRAVENOUS
  Filled 2020-12-17: qty 150

## 2020-12-17 MED ORDER — SODIUM CHLORIDE 0.9 % IV SOLN
10.0000 mg | Freq: Once | INTRAVENOUS | Status: AC
  Administered 2020-12-17: 10 mg via INTRAVENOUS
  Filled 2020-12-17: qty 10

## 2020-12-17 MED ORDER — HEPARIN SOD (PORK) LOCK FLUSH 100 UNIT/ML IV SOLN
500.0000 [IU] | Freq: Once | INTRAVENOUS | Status: AC | PRN
  Administered 2020-12-17: 500 [IU]
  Filled 2020-12-17: qty 5

## 2020-12-17 NOTE — Progress Notes (Signed)
Hematology and Oncology Follow Up Visit  Vanessa Garrett [id number] [birth year] 80 y.o. 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  She comes in with her son.  I must say that she does look pretty good.  We have not been able to add immunotherapy to the chemotherapy.  Recent clinical trials seem to support the use of immunotherapy with chemotherapy for metastatic gallbladder cancer.  She does have the anemia.  She is a Jehovah's witness so she does not accept blood.  We are giving her Retacrit.  We started her on Megace.  She is on Megace in addition to Marinol to try to help with her appetite.  Hopefully, the combination will provide some nutritional increase for her.  There is no bleeding.  She has had no diarrhea.  It sounds like she might be going up to [place] next weekend.  She is from [place].  She goes up there to be with family.  She has had no change in leg swelling.  Her left leg is little more swollen than the right leg.  She has some compression stockings on.  She has had no fever.  There is no cough.  She has had no shortness of breath.  Overall, I would have to say performance status is probably ECOG 1-2.   Medications:  Allergies as of 12/17/2020   No Known Allergies     Medication List       Accurate as of December 17, 2020 11:08 AM. If you have any questions, ask your nurse or doctor.        dexamethasone 4 MG tablet Commonly known as: DECADRON Take 2 tablets (8 mg total) by mouth daily. Start the day  after carboplatin chemotherapy for 3 days.   dronabinol 2.5 MG capsule Commonly known as: MARINOL TAKE 1 CAPSULE BY MOUTH 2 TIMES DAILY BEFORE A MEAL   dronabinol 2.5 MG capsule Commonly known as: MARINOL Take 1 capsule (2.5 mg total) by mouth 2 (two) times daily before a meal.   lidocaine-prilocaine cream Commonly known as: EMLA Apply to affected area once   lidocaine-prilocaine cream Commonly known as: EMLA Apply to affected area once   LORazepam 0.5 MG tablet Commonly known as: Ativan Take 1 tablet (0.5 mg total) by mouth every 6 (six) hours as needed (Nausea or vomiting).   megestrol 400 MG/10ML suspension Commonly known as: MEGACE Take 10 mLs (400 mg total) by mouth 2 (two) times daily.   metoprolol tartrate 25 MG tablet Commonly known as: LOPRESSOR TAKE 1 TABLET(25 MG) BY MOUTH TWICE DAILY   NON FORMULARY Take 2 tablets by mouth every other day. Mega Food - Blood Builder   NON FORMULARY 1.25 mg every other day. Beet Root Powder   ondansetron 8 MG tablet Commonly known as: Zofran Take 1 tablet (8 mg total) by mouth 2 (two) times daily as needed for refractory nausea / vomiting. Start on day 3 after carboplatin chemo.   ondansetron 8 MG tablet Commonly known as: Zofran Take 1 tablet (8 mg total) by mouth 2 (two) times daily as  needed (Nausea or vomiting).   potassium chloride SA 20 MEQ tablet Commonly known as: KLOR-CON TAKE 1 TABLET BY MOUTH ONCE DAILY   prochlorperazine 10 MG tablet Commonly known as: COMPAZINE TAKE 1 TABLET(10 MG) BY MOUTH EVERY 6 HOURS AS NEEDED FOR NAUSEA OR VOMITING   prochlorperazine 10 MG tablet Commonly known as: COMPAZINE Take 1 tablet (10 mg total) by mouth every 6 (six) hours as needed (Nausea or vomiting).   Retacrit 10000 UNIT/ML injection Generic drug: epoetin alfa-epbx Inject 1 mL (10,000 Units total) into the skin 3 (three) times a week.   S.S.S. TONIC PO Take 45 mLs by mouth every other day.    triamterene-hydrochlorothiazide 75-50 MG tablet Commonly known as: Maxzide Take 1 tablet by mouth daily.   vitamin B-12 1000 MCG tablet Commonly known as: CYANOCOBALAMIN Take 1,000 mcg by mouth daily.   VITAMIN D PO Take 2 tablets by mouth daily.       Allergies: No Known Allergies  Past Medical History, Surgical history, Social history, and Family History were reviewed and updated.  Review of Systems: Review of Systems  Constitutional: Positive for malaise/fatigue and weight loss.  HENT: Negative.   Eyes: Negative.   Respiratory: Negative.   Cardiovascular: Positive for leg swelling.  Gastrointestinal: Positive for nausea.  Genitourinary: Negative.   Musculoskeletal: Negative.   Skin: Negative.   Neurological: Negative.   Endo/Heme/Allergies: Negative.   Psychiatric/Behavioral: Negative.    Marland Kitchen   Physical Exam:  weight is 151 lb (68.5 kg). Her oral temperature is 97.3 F (36.3 C) (abnormal). Her blood pressure is 136/86 and her pulse is 73. Her respiration is 19 and oxygen saturation is 100%.   Wt Readings from Last 3 Encounters:  12/17/20 151 lb (68.5 kg)  11/19/20 151 lb (68.5 kg)  10/28/20 154 lb 6.4 oz (70 kg)    Physical Exam Vitals reviewed.  HENT:     Head: Normocephalic and atraumatic.  Eyes:     Pupils: Pupils are equal, round, and reactive to light.  Cardiovascular:     Rate and Rhythm: Normal rate and regular rhythm.     Heart sounds: Normal heart sounds.  Pulmonary:     Effort: Pulmonary effort is normal.     Breath sounds: Normal breath sounds.  Abdominal:     General: Bowel sounds are normal.     Palpations: Abdomen is soft.  Musculoskeletal:        General: No tenderness or deformity. Normal range of motion.     Cervical back: Normal range of motion.     Comments: Her extremities does show some swelling in the legs.  There might be a little bit more swelling in the right leg than in the left leg.  Lymphadenopathy:     Cervical: No  cervical adenopathy.  Skin:    General: Skin is warm and dry.     Findings: No erythema or rash.  Neurological:     Mental Status: She is alert and oriented to person, place, and time.  Psychiatric:        Behavior: Behavior normal.        Thought Content: Thought content normal.        Judgment: Judgment normal.      Lab Results  Component Value Date   WBC 4.6 12/17/2020   HGB 9.1 (L) 12/17/2020   HCT 27.9 (L) 12/17/2020   MCV 93.6 12/17/2020   PLT 268 12/17/2020   Lab Results  Component Value Date   FERRITIN  1,418 (H) 11/19/2020   IRON 31 (L) 11/19/2020   TIBC 251 11/19/2020   UIBC 220 11/19/2020   IRONPCTSAT 12 (L) 11/19/2020   Lab Results  Component Value Date   RETICCTPCT 2.4 12/17/2020   RBC 2.93 (L) 12/17/2020   No results found for: KPAFRELGTCHN, LAMBDASER, KAPLAMBRATIO No results found for: IGGSERUM, IGA, IGMSERUM No results found for: TOTALPROTELP, ALBUMINELP, A1GS, A2GS, BETS, BETA2SER, GAMS, MSPIKE, SPEI   Chemistry      Component Value Date/Time   NA 124 (L) 12/17/2020 1005   K 3.7 12/17/2020 1005   CL 91 (L) 12/17/2020 1005   CO2 24 12/17/2020 1005   BUN 40 (H) 12/17/2020 1005   CREATININE 2.15 (H) 12/17/2020 1005      Component Value Date/Time   CALCIUM 9.2 12/17/2020 1005   ALKPHOS 114 12/17/2020 1005   AST 18 12/17/2020 1005   ALT 7 12/17/2020 1005   BILITOT 0.4 12/17/2020 1005       Impression and Plan: Vanessa Garrett is a very pleasant 80 yo African American female with stage IV adenocarcinoma of the gallbladder.   I know that she was off treatment for a while because she had Covid.  We got her back on treatment.  Her last MRI seems to show everything is relatively stable.  Maybe, with the addition of immunotherapy, we will be able to improve the response rate.  So far, she is tolerating the treatment well with the Durvalumab.  She will get her Retacrit today for the anemia.  Hopefully we can keep her hemoglobin stable.  We will go  ahead with her seventh cycle of treatment.  She just had scans done so I do not think we have to do scans probably until June.   Volanda Napoleon, MD 4/22/202211:08 AM

## 2020-12-17 NOTE — Progress Notes (Signed)
Per Dr. Marin Olp, it's OK to treat with a Serum Creatinine of 2.15 today. Pharmacy aware.

## 2020-12-17 NOTE — Patient Instructions (Signed)

## 2020-12-17 NOTE — Progress Notes (Signed)
Okay to treat today with SCr 2.15 per Dr. Marin Olp.

## 2020-12-17 NOTE — Patient Instructions (Signed)
Carboplatin injection What is this medicine? CARBOPLATIN (KAR boe pla tin) is a chemotherapy drug. It targets fast dividing cells, like cancer cells, and causes these cells to die. This medicine is used to treat ovarian cancer and many other cancers. This medicine may be used for other purposes; ask your health care provider or pharmacist if you have questions. COMMON BRAND NAME(S): Paraplatin What should I tell my health care provider before I take this medicine? They need to know if you have any of these conditions:  blood disorders  hearing problems  kidney disease  recent or ongoing radiation therapy  an unusual or allergic reaction to carboplatin, cisplatin, other chemotherapy, other medicines, foods, dyes, or preservatives  pregnant or trying to get pregnant  breast-feeding How should I use this medicine? This drug is usually given as an infusion into a vein. It is administered in a hospital or clinic by a specially trained health care professional. Talk to your pediatrician regarding the use of this medicine in children. Special care may be needed. Overdosage: If you think you have taken too much of this medicine contact a poison control center or emergency room at once. NOTE: This medicine is only for you. Do not share this medicine with others. What if I miss a dose? It is important not to miss a dose. Call your doctor or health care professional if you are unable to keep an appointment. What may interact with this medicine?  medicines for seizures  medicines to increase blood counts like filgrastim, pegfilgrastim, sargramostim  some antibiotics like amikacin, gentamicin, neomycin, streptomycin, tobramycin  vaccines Talk to your doctor or health care professional before taking any of these medicines:  acetaminophen  aspirin  ibuprofen  ketoprofen  naproxen This list may not describe all possible interactions. Give your health care provider a list of all the  medicines, herbs, non-prescription drugs, or dietary supplements you use. Also tell them if you smoke, drink alcohol, or use illegal drugs. Some items may interact with your medicine. What should I watch for while using this medicine? Your condition will be monitored carefully while you are receiving this medicine. You will need important blood work done while you are taking this medicine. This drug may make you feel generally unwell. This is not uncommon, as chemotherapy can affect healthy cells as well as cancer cells. Report any side effects. Continue your course of treatment even though you feel ill unless your doctor tells you to stop. In some cases, you may be given additional medicines to help with side effects. Follow all directions for their use. Call your doctor or health care professional for advice if you get a fever, chills or sore throat, or other symptoms of a cold or flu. Do not treat yourself. This drug decreases your body's ability to fight infections. Try to avoid being around people who are sick. This medicine may increase your risk to bruise or bleed. Call your doctor or health care professional if you notice any unusual bleeding. Be careful brushing and flossing your teeth or using a toothpick because you may get an infection or bleed more easily. If you have any dental work done, tell your dentist you are receiving this medicine. Avoid taking products that contain aspirin, acetaminophen, ibuprofen, naproxen, or ketoprofen unless instructed by your doctor. These medicines may hide a fever. Do not become pregnant while taking this medicine. Women should inform their doctor if they wish to become pregnant or think they might be pregnant. There is a  potential for serious side effects to an unborn child. Talk to your health care professional or pharmacist for more information. Do not breast-feed an infant while taking this medicine. What side effects may I notice from receiving this  medicine? Side effects that you should report to your doctor or health care professional as soon as possible:  allergic reactions like skin rash, itching or hives, swelling of the face, lips, or tongue  signs of infection - fever or chills, cough, sore throat, pain or difficulty passing urine  signs of decreased platelets or bleeding - bruising, pinpoint red spots on the skin, black, tarry stools, nosebleeds  signs of decreased red blood cells - unusually weak or tired, fainting spells, lightheadedness  breathing problems  changes in hearing  changes in vision  chest pain  high blood pressure  low blood counts - This drug may decrease the number of white blood cells, red blood cells and platelets. You may be at increased risk for infections and bleeding.  nausea and vomiting  pain, swelling, redness or irritation at the injection site  pain, tingling, numbness in the hands or feet  problems with balance, talking, walking  trouble passing urine or change in the amount of urine Side effects that usually do not require medical attention (report to your doctor or health care professional if they continue or are bothersome):  hair loss  loss of appetite  metallic taste in the mouth or changes in taste This list may not describe all possible side effects. Call your doctor for medical advice about side effects. You may report side effects to FDA at 1-800-FDA-1088. Where should I keep my medicine? This drug is given in a hospital or clinic and will not be stored at home. NOTE: This sheet is a summary. It may not cover all possible information. If you have questions about this medicine, talk to your doctor, pharmacist, or health care provider.  2021 Elsevier/Gold Standard (2007-11-19 14:38:05) Gemcitabine injection What is this medicine? GEMCITABINE (jem SYE ta been) is a chemotherapy drug. This medicine is used to treat many types of cancer like breast cancer, lung cancer,  pancreatic cancer, and ovarian cancer. This medicine may be used for other purposes; ask your health care provider or pharmacist if you have questions. COMMON BRAND NAME(S): Gemzar, Infugem What should I tell my health care provider before I take this medicine? They need to know if you have any of these conditions:  blood disorders  infection  kidney disease  liver disease  lung or breathing disease, like asthma  recent or ongoing radiation therapy  an unusual or allergic reaction to gemcitabine, other chemotherapy, other medicines, foods, dyes, or preservatives  pregnant or trying to get pregnant  breast-feeding How should I use this medicine? This drug is given as an infusion into a vein. It is administered in a hospital or clinic by a specially trained health care professional. Talk to your pediatrician regarding the use of this medicine in children. Special care may be needed. Overdosage: If you think you have taken too much of this medicine contact a poison control center or emergency room at once. NOTE: This medicine is only for you. Do not share this medicine with others. What if I miss a dose? It is important not to miss your dose. Call your doctor or health care professional if you are unable to keep an appointment. What may interact with this medicine?  medicines to increase blood counts like filgrastim, pegfilgrastim, sargramostim  some other  chemotherapy drugs like cisplatin  vaccines Talk to your doctor or health care professional before taking any of these medicines:  acetaminophen  aspirin  ibuprofen  ketoprofen  naproxen This list may not describe all possible interactions. Give your health care provider a list of all the medicines, herbs, non-prescription drugs, or dietary supplements you use. Also tell them if you smoke, drink alcohol, or use illegal drugs. Some items may interact with your medicine. What should I watch for while using this  medicine? Visit your doctor for checks on your progress. This drug may make you feel generally unwell. This is not uncommon, as chemotherapy can affect healthy cells as well as cancer cells. Report any side effects. Continue your course of treatment even though you feel ill unless your doctor tells you to stop. In some cases, you may be given additional medicines to help with side effects. Follow all directions for their use. Call your doctor or health care professional for advice if you get a fever, chills or sore throat, or other symptoms of a cold or flu. Do not treat yourself. This drug decreases your body's ability to fight infections. Try to avoid being around people who are sick. This medicine may increase your risk to bruise or bleed. Call your doctor or health care professional if you notice any unusual bleeding. Be careful brushing and flossing your teeth or using a toothpick because you may get an infection or bleed more easily. If you have any dental work done, tell your dentist you are receiving this medicine. Avoid taking products that contain aspirin, acetaminophen, ibuprofen, naproxen, or ketoprofen unless instructed by your doctor. These medicines may hide a fever. Do not become pregnant while taking this medicine or for 6 months after stopping it. Women should inform their doctor if they wish to become pregnant or think they might be pregnant. Men should not father a child while taking this medicine and for 3 months after stopping it. There is a potential for serious side effects to an unborn child. Talk to your health care professional or pharmacist for more information. Do not breast-feed an infant while taking this medicine or for at least 1 week after stopping it. Men should inform their doctors if they wish to father a child. This medicine may lower sperm counts. Talk with your doctor or health care professional if you are concerned about your fertility. What side effects may I notice  from receiving this medicine? Side effects that you should report to your doctor or health care professional as soon as possible:  allergic reactions like skin rash, itching or hives, swelling of the face, lips, or tongue  breathing problems  pain, redness, or irritation at site where injected  signs and symptoms of a dangerous change in heartbeat or heart rhythm like chest pain; dizziness; fast or irregular heartbeat; palpitations; feeling faint or lightheaded, falls; breathing problems  signs of decreased platelets or bleeding - bruising, pinpoint red spots on the skin, black, tarry stools, blood in the urine  signs of decreased red blood cells - unusually weak or tired, feeling faint or lightheaded, falls  signs of infection - fever or chills, cough, sore throat, pain or difficulty passing urine  signs and symptoms of kidney injury like trouble passing urine or change in the amount of urine  signs and symptoms of liver injury like dark yellow or brown urine; general ill feeling or flu-like symptoms; light-colored stools; loss of appetite; nausea; right upper belly pain; unusually weak or  tired; yellowing of the eyes or skin  swelling of ankles, feet, hands Side effects that usually do not require medical attention (report to your doctor or health care professional if they continue or are bothersome):  constipation  diarrhea  hair loss  loss of appetite  nausea  rash  vomiting This list may not describe all possible side effects. Call your doctor for medical advice about side effects. You may report side effects to FDA at 1-800-FDA-1088. Where should I keep my medicine? This drug is given in a hospital or clinic and will not be stored at home. NOTE: This sheet is a summary. It may not cover all possible information. If you have questions about this medicine, talk to your doctor, pharmacist, or health care provider.  2021 Elsevier/Gold Standard (2017-11-07  18:06:11) Durvalumab injection What is this medicine? DURVALUMAB (dur VAL ue mab) is a monoclonal antibody. It is used to treat lung cancer. This medicine may be used for other purposes; ask your health care provider or pharmacist if you have questions. COMMON BRAND NAME(S): IMFINZI What should I tell my health care provider before I take this medicine? They need to know if you have any of these conditions:  autoimmune diseases like Crohn's disease, ulcerative colitis, or lupus  have had or planning to have an allogeneic stem cell transplant (uses someone else's stem cells)  history of organ transplant  history of radiation to the chest  nervous system problems like myasthenia gravis or Guillain-Barre syndrome  an unusual or allergic reaction to durvalumab, other medicines, foods, dyes, or preservatives  pregnant or trying to get pregnant  breast-feeding How should I use this medicine? This medicine is for infusion into a vein. It is given by a health care professional in a hospital or clinic setting. A special MedGuide will be given to you before each treatment. Be sure to read this information carefully each time. Talk to your pediatrician regarding the use of this medicine in children. Special care may be needed. Overdosage: If you think you have taken too much of this medicine contact a poison control center or emergency room at once. NOTE: This medicine is only for you. Do not share this medicine with others. What if I miss a dose? It is important not to miss your dose. Call your doctor or health care professional if you are unable to keep an appointment. What may interact with this medicine? Interactions have not been studied. This list may not describe all possible interactions. Give your health care provider a list of all the medicines, herbs, non-prescription drugs, or dietary supplements you use. Also tell them if you smoke, drink alcohol, or use illegal drugs. Some items  may interact with your medicine. What should I watch for while using this medicine? This drug may make you feel generally unwell. Continue your course of treatment even though you feel ill unless your doctor tells you to stop. You may need blood work done while you are taking this medicine. Do not become pregnant while taking this medicine or for 3 months after stopping it. Women should inform their doctor if they wish to become pregnant or think they might be pregnant. There is a potential for serious side effects to an unborn child. Talk to your health care professional or pharmacist for more information. Do not breast-feed an infant while taking this medicine or for 3 months after stopping it. What side effects may I notice from receiving this medicine? Side effects that you should report  to your doctor or health care professional as soon as possible:  allergic reactions like skin rash, itching or hives, swelling of the face, lips, or tongue  black, tarry stools  bloody or watery diarrhea  breathing problems  change in emotions or moods  change in sex drive  changes in vision  chest pain or chest tightness  chills  confusion  cough  facial flushing  fever  headache  signs and symptoms of high blood sugar such as dizziness; dry mouth; dry skin; fruity breath; nausea; stomach pain; increased hunger or thirst; increased urination  signs and symptoms of liver injury like dark yellow or brown urine; general ill feeling or flu-like symptoms; light-colored stools; loss of appetite; nausea; right upper belly pain; unusually weak or tired; yellowing of the eyes or skin  stomach pain  trouble passing urine or change in the amount of urine  weight gain or weight loss Side effects that usually do not require medical attention (report these to your doctor or health care professional if they continue or are bothersome):  bone pain  constipation  loss of appetite  muscle  pain  nausea  swelling of the ankles, feet, hands  tiredness This list may not describe all possible side effects. Call your doctor for medical advice about side effects. You may report side effects to FDA at 1-800-FDA-1088. Where should I keep my medicine? This drug is given in a hospital or clinic and will not be stored at home. NOTE: This sheet is a summary. It may not cover all possible information. If you have questions about this medicine, talk to your doctor, pharmacist, or health care provider.  2021 Elsevier/Gold Standard (2019-10-23 13:01:29)

## 2020-12-18 LAB — CANCER ANTIGEN 19-9: CA 19-9: 1882 U/mL — ABNORMAL HIGH (ref 0–35)

## 2020-12-18 LAB — T4: T4, Total: 11.1 ug/dL (ref 4.5–12.0)

## 2020-12-21 ENCOUNTER — Telehealth: Payer: Self-pay | Admitting: *Deleted

## 2020-12-21 NOTE — Progress Notes (Signed)
Oncology Nurse Navigator Documentation  Oncology Nurse Navigator Flowsheets 12/17/2020  Abnormal Finding Date -  Planned Course of Treatment -  Phase of Treatment -  Chemotherapy Actual Start Date: -  Navigator Follow Up Date: 01/07/2021  Navigator Follow Up Reason: Follow-up Appointment;Chemotherapy  Navigator Location [facility]  Referral Date to RadOnc/MedOnc -  Navigator Encounter Type Appt/Treatment Plan Review  Telephone -  Treatment Initiated Date -  Patient Visit Type MedOnc  Treatment Phase Active Tx  Barriers/Navigation Needs Coordination of Care;Education  Education -  Interventions None Required  Acuity Level 2-Minimal Needs (1-2 Barriers Identified)  Referrals -  Coordination of Care -  Education Method -  Support Groups/Services Friends and Family  Time Spent with Patient 15

## 2020-12-21 NOTE — Telephone Encounter (Signed)
Per secure chat 12/21/20 - called and lvm of upcoming appointments - mailed calendat

## 2020-12-22 ENCOUNTER — Other Ambulatory Visit: Payer: Self-pay

## 2020-12-22 ENCOUNTER — Inpatient Hospital Stay: Payer: Medicare PPO

## 2020-12-22 VITALS — BP 123/61 | HR 64 | Temp 98.0°F | Resp 18

## 2020-12-22 DIAGNOSIS — Z5112 Encounter for antineoplastic immunotherapy: Secondary | ICD-10-CM | POA: Diagnosis not present

## 2020-12-22 DIAGNOSIS — C23 Malignant neoplasm of gallbladder: Secondary | ICD-10-CM

## 2020-12-22 DIAGNOSIS — R160 Hepatomegaly, not elsewhere classified: Secondary | ICD-10-CM

## 2020-12-22 LAB — CBC WITH DIFFERENTIAL/PLATELET
Abs Immature Granulocytes: 0.02 10*3/uL (ref 0.00–0.07)
Basophils Absolute: 0 10*3/uL (ref 0.0–0.1)
Basophils Relative: 1 %
Eosinophils Absolute: 0 10*3/uL (ref 0.0–0.5)
Eosinophils Relative: 0 %
HCT: 27 % — ABNORMAL LOW (ref 36.0–46.0)
Hemoglobin: 8.7 g/dL — ABNORMAL LOW (ref 12.0–15.0)
Immature Granulocytes: 1 %
Lymphocytes Relative: 10 %
Lymphs Abs: 0.4 10*3/uL — ABNORMAL LOW (ref 0.7–4.0)
MCH: 30.9 pg (ref 26.0–34.0)
MCHC: 32.2 g/dL (ref 30.0–36.0)
MCV: 95.7 fL (ref 80.0–100.0)
Monocytes Absolute: 0 10*3/uL — ABNORMAL LOW (ref 0.1–1.0)
Monocytes Relative: 1 %
Neutro Abs: 3.4 10*3/uL (ref 1.7–7.7)
Neutrophils Relative %: 87 %
Platelets: 275 10*3/uL (ref 150–400)
RBC: 2.82 MIL/uL — ABNORMAL LOW (ref 3.87–5.11)
RDW: 17.9 % — ABNORMAL HIGH (ref 11.5–15.5)
WBC: 3.9 10*3/uL — ABNORMAL LOW (ref 4.0–10.5)
nRBC: 0 % (ref 0.0–0.2)

## 2020-12-22 LAB — TSH: TSH: 1.504 u[IU]/mL (ref 0.308–3.960)

## 2020-12-22 LAB — COMPREHENSIVE METABOLIC PANEL
ALT: 19 U/L (ref 0–44)
AST: 20 U/L (ref 15–41)
Albumin: 3.4 g/dL — ABNORMAL LOW (ref 3.5–5.0)
Alkaline Phosphatase: 84 U/L (ref 38–126)
Anion gap: 8 (ref 5–15)
BUN: 51 mg/dL — ABNORMAL HIGH (ref 8–23)
CO2: 22 mmol/L (ref 22–32)
Calcium: 9.1 mg/dL (ref 8.9–10.3)
Chloride: 99 mmol/L (ref 98–111)
Creatinine, Ser: 2.61 mg/dL — ABNORMAL HIGH (ref 0.44–1.00)
GFR, Estimated: 18 mL/min — ABNORMAL LOW (ref >60)
Glucose, Bld: 116 mg/dL — ABNORMAL HIGH (ref 70–99)
Potassium: 4.9 mmol/L (ref 3.5–5.1)
Sodium: 129 mmol/L — ABNORMAL LOW (ref 135–145)
Total Bilirubin: 0.3 mg/dL (ref 0.3–1.2)
Total Protein: 6.6 g/dL (ref 6.5–8.1)

## 2020-12-22 MED ORDER — PROCHLORPERAZINE MALEATE 10 MG PO TABS
ORAL_TABLET | ORAL | Status: AC
  Filled 2020-12-22: qty 1

## 2020-12-22 MED ORDER — PROCHLORPERAZINE MALEATE 10 MG PO TABS
10.0000 mg | ORAL_TABLET | Freq: Once | ORAL | Status: AC
  Administered 2020-12-22: 10 mg via ORAL

## 2020-12-22 MED ORDER — SODIUM CHLORIDE 0.9% FLUSH
10.0000 mL | INTRAVENOUS | Status: DC | PRN
  Administered 2020-12-22: 10 mL
  Filled 2020-12-22: qty 10

## 2020-12-22 MED ORDER — HEPARIN SOD (PORK) LOCK FLUSH 100 UNIT/ML IV SOLN
500.0000 [IU] | Freq: Once | INTRAVENOUS | Status: AC | PRN
  Administered 2020-12-22: 500 [IU]
  Filled 2020-12-22: qty 5

## 2020-12-22 MED ORDER — SODIUM CHLORIDE 0.9 % IV SOLN
1400.0000 mg | Freq: Once | INTRAVENOUS | Status: AC
  Administered 2020-12-22: 1400 mg via INTRAVENOUS
  Filled 2020-12-22: qty 26.3

## 2020-12-22 MED ORDER — SODIUM CHLORIDE 0.9 % IV SOLN
Freq: Once | INTRAVENOUS | Status: AC
  Filled 2020-12-22: qty 250

## 2020-12-22 NOTE — Patient Instructions (Signed)
Gemcitabine injection What is this medicine? GEMCITABINE (jem SYE ta been) is a chemotherapy drug. This medicine is used to treat many types of cancer like breast cancer, lung cancer, pancreatic cancer, and ovarian cancer. This medicine may be used for other purposes; ask your health care provider or pharmacist if you have questions. COMMON BRAND NAME(S): Gemzar, Infugem What should I tell my health care provider before I take this medicine? They need to know if you have any of these conditions:  blood disorders  infection  kidney disease  liver disease  lung or breathing disease, like asthma  recent or ongoing radiation therapy  an unusual or allergic reaction to gemcitabine, other chemotherapy, other medicines, foods, dyes, or preservatives  pregnant or trying to get pregnant  breast-feeding How should I use this medicine? This drug is given as an infusion into a vein. It is administered in a hospital or clinic by a specially trained health care professional. Talk to your pediatrician regarding the use of this medicine in children. Special care may be needed. Overdosage: If you think you have taken too much of this medicine contact a poison control center or emergency room at once. NOTE: This medicine is only for you. Do not share this medicine with others. What if I miss a dose? It is important not to miss your dose. Call your doctor or health care professional if you are unable to keep an appointment. What may interact with this medicine?  medicines to increase blood counts like filgrastim, pegfilgrastim, sargramostim  some other chemotherapy drugs like cisplatin  vaccines Talk to your doctor or health care professional before taking any of these medicines:  acetaminophen  aspirin  ibuprofen  ketoprofen  naproxen This list may not describe all possible interactions. Give your health care provider a list of all the medicines, herbs, non-prescription drugs, or  dietary supplements you use. Also tell them if you smoke, drink alcohol, or use illegal drugs. Some items may interact with your medicine. What should I watch for while using this medicine? Visit your doctor for checks on your progress. This drug may make you feel generally unwell. This is not uncommon, as chemotherapy can affect healthy cells as well as cancer cells. Report any side effects. Continue your course of treatment even though you feel ill unless your doctor tells you to stop. In some cases, you may be given additional medicines to help with side effects. Follow all directions for their use. Call your doctor or health care professional for advice if you get a fever, chills or sore throat, or other symptoms of a cold or flu. Do not treat yourself. This drug decreases your body's ability to fight infections. Try to avoid being around people who are sick. This medicine may increase your risk to bruise or bleed. Call your doctor or health care professional if you notice any unusual bleeding. Be careful brushing and flossing your teeth or using a toothpick because you may get an infection or bleed more easily. If you have any dental work done, tell your dentist you are receiving this medicine. Avoid taking products that contain aspirin, acetaminophen, ibuprofen, naproxen, or ketoprofen unless instructed by your doctor. These medicines may hide a fever. Do not become pregnant while taking this medicine or for 6 months after stopping it. Women should inform their doctor if they wish to become pregnant or think they might be pregnant. Men should not father a child while taking this medicine and for 3 months after stopping it.   There is a potential for serious side effects to an unborn child. Talk to your health care professional or pharmacist for more information. Do not breast-feed an infant while taking this medicine or for at least 1 week after stopping it. Men should inform their doctors if they wish  to father a child. This medicine may lower sperm counts. Talk with your doctor or health care professional if you are concerned about your fertility. What side effects may I notice from receiving this medicine? Side effects that you should report to your doctor or health care professional as soon as possible:  allergic reactions like skin rash, itching or hives, swelling of the face, lips, or tongue  breathing problems  pain, redness, or irritation at site where injected  signs and symptoms of a dangerous change in heartbeat or heart rhythm like chest pain; dizziness; fast or irregular heartbeat; palpitations; feeling faint or lightheaded, falls; breathing problems  signs of decreased platelets or bleeding - bruising, pinpoint red spots on the skin, black, tarry stools, blood in the urine  signs of decreased red blood cells - unusually weak or tired, feeling faint or lightheaded, falls  signs of infection - fever or chills, cough, sore throat, pain or difficulty passing urine  signs and symptoms of kidney injury like trouble passing urine or change in the amount of urine  signs and symptoms of liver injury like dark yellow or brown urine; general ill feeling or flu-like symptoms; light-colored stools; loss of appetite; nausea; right upper belly pain; unusually weak or tired; yellowing of the eyes or skin  swelling of ankles, feet, hands Side effects that usually do not require medical attention (report to your doctor or health care professional if they continue or are bothersome):  constipation  diarrhea  hair loss  loss of appetite  nausea  rash  vomiting This list may not describe all possible side effects. Call your doctor for medical advice about side effects. You may report side effects to FDA at 1-800-FDA-1088. Where should I keep my medicine? This drug is given in a hospital or clinic and will not be stored at home. NOTE: This sheet is a summary. It may not cover all  possible information. If you have questions about this medicine, talk to your doctor, pharmacist, or health care provider.  2021 Elsevier/Gold Standard (2017-11-07 18:06:11)  

## 2020-12-22 NOTE — Progress Notes (Signed)
Per dr ennever okay to treat today despite labs °

## 2020-12-22 NOTE — Patient Instructions (Signed)
Implanted Port Insertion, Care After This sheet gives you information about how to care for yourself after your procedure. Your health care provider may also give you more specific instructions. If you have problems or questions, contact your health care provider. What can I expect after the procedure? After the procedure, it is common to have:  Discomfort at the port insertion site.  Bruising on the skin over the port. This should improve over 3-4 days. Follow these instructions at home: Port care  After your port is placed, you will get a manufacturer's information card. The card has information about your port. Keep this card with you at all times.  Take care of the port as told by your health care provider. Ask your health care provider if you or a family member can get training for taking care of the port at home. A home health care nurse may also take care of the port.  Make sure to remember what type of port you have. Incision care  Follow instructions from your health care provider about how to take care of your port insertion site. Make sure you: ? Wash your hands with soap and water before and after you change your bandage (dressing). If soap and water are not available, use hand sanitizer. ? Change your dressing as told by your health care provider. ? Leave stitches (sutures), skin glue, or adhesive strips in place. These skin closures may need to stay in place for 2 weeks or longer. If adhesive strip edges start to loosen and curl up, you may trim the loose edges. Do not remove adhesive strips completely unless your health care provider tells you to do that.  Check your port insertion site every day for signs of infection. Check for: ? Redness, swelling, or pain. ? Fluid or blood. ? Warmth. ? Pus or a bad smell.      Activity  Return to your normal activities as told by your health care provider. Ask your health care provider what activities are safe for you.  Do not  lift anything that is heavier than 10 lb (4.5 kg), or the limit that you are told, until your health care provider says that it is safe. General instructions  Take over-the-counter and prescription medicines only as told by your health care provider.  Do not take baths, swim, or use a hot tub until your health care provider approves. Ask your health care provider if you may take showers. You may only be allowed to take sponge baths.  Do not drive for 24 hours if you were given a sedative during your procedure.  Wear a medical alert bracelet in case of an emergency. This will tell any health care providers that you have a port.  Keep all follow-up visits as told by your health care provider. This is important. Contact a health care provider if:  You cannot flush your port with saline as directed, or you cannot draw blood from the port.  You have a fever or chills.  You have redness, swelling, or pain around your port insertion site.  You have fluid or blood coming from your port insertion site.  Your port insertion site feels warm to the touch.  You have pus or a bad smell coming from the port insertion site. Get help right away if:  You have chest pain or shortness of breath.  You have bleeding from your port that you cannot control. Summary  Take care of the port as told by your   health care provider. Keep the manufacturer's information card with you at all times.  Change your dressing as told by your health care provider.  Contact a health care provider if you have a fever or chills or if you have redness, swelling, or pain around your port insertion site.  Keep all follow-up visits as told by your health care provider. This information is not intended to replace advice given to you by your health care provider. Make sure you discuss any questions you have with your health care provider. Document Revised: 03/12/2018 Document Reviewed: 03/12/2018 Elsevier Patient Education   2021 Elsevier Inc.  

## 2020-12-23 LAB — T4: T4, Total: 8.3 ug/dL (ref 4.5–12.0)

## 2020-12-29 ENCOUNTER — Other Ambulatory Visit: Payer: Self-pay | Admitting: *Deleted

## 2020-12-29 DIAGNOSIS — D500 Iron deficiency anemia secondary to blood loss (chronic): Secondary | ICD-10-CM

## 2020-12-29 DIAGNOSIS — C23 Malignant neoplasm of gallbladder: Secondary | ICD-10-CM

## 2020-12-29 MED ORDER — MEGESTROL ACETATE 400 MG/10ML PO SUSP: 400.0000 mg | Freq: Two times a day (BID) | ORAL | 3 refills | Status: DC

## 2021-01-02 ENCOUNTER — Other Ambulatory Visit: Payer: Self-pay | Admitting: Hematology & Oncology

## 2021-01-04 ENCOUNTER — Other Ambulatory Visit: Payer: Self-pay | Admitting: *Deleted

## 2021-01-04 MED ORDER — DRONABINOL 2.5 MG PO CAPS: 2.5000 mg | ORAL_CAPSULE | Freq: Two times a day (BID) | ORAL | 0 refills | Status: DC

## 2021-01-05 ENCOUNTER — Encounter: Payer: Self-pay | Admitting: *Deleted

## 2021-01-05 NOTE — Progress Notes (Signed)
Received a call from patient's daughter, Vanessa Garrett. She states patient has some swelling and redness to her eyes, along with her upper cheeks. This began yesterday, but she feels like its getting slightly worse. Patient had no new medication or foods yesterday. She did work in a garden, but she feels as though the eye irritation was already present when she did. Patient denied any feelings of discomfort. No complaints of swelling in the mouth or difficulty breathing. Vanessa Garrett wants to know if she can give her some benadryl.  Instructed [name] to go ahead and give patient of dose of Benadryl. Also requested that she take a photo of patient and send via [place]. Photo was received at 1400 and shared with Dr Marin Olp.   Dr Marin Olp would like the patient to call us back if the benadryl doesn't relieve her symptoms and we can try a medrol dose pack. If she develops any mouth swelling or itching she needs to go to the ED. This information is given to [name] and she understands.   Oncology Nurse Navigator Documentation  Oncology Nurse Navigator Flowsheets 01/05/2021  Abnormal Finding Date -  Planned Course of Treatment -  Phase of Treatment -  Chemotherapy Actual Start Date: -  Navigator Follow Up Date: 01/07/2021  Navigator Follow Up Reason: Follow-up Appointment  Navigator Location [facility]  Referral Date to RadOnc/MedOnc -  Navigator Encounter Type Telephone  Telephone Symptom Mgt;Incoming Call  Treatment Initiated Date -  Patient Visit Type MedOnc  Treatment Phase Active Tx  Barriers/Navigation Needs Coordination of Care;Education  Education Pain/ Symptom Management  Interventions Education;Psycho-Social Support  Acuity Level 2-Minimal Needs (1-2 Barriers Identified)  Referrals -  Coordination of Care -  Education Method Verbal  Support Groups/Services Friends and Family  Time Spent with Patient 31

## 2021-01-07 ENCOUNTER — Inpatient Hospital Stay: Payer: Medicare PPO | Attending: Hematology & Oncology

## 2021-01-07 ENCOUNTER — Inpatient Hospital Stay: Payer: Medicare PPO

## 2021-01-07 ENCOUNTER — Other Ambulatory Visit: Payer: Self-pay | Admitting: *Deleted

## 2021-01-07 ENCOUNTER — Encounter: Payer: Self-pay | Admitting: Hematology & Oncology

## 2021-01-07 ENCOUNTER — Encounter: Payer: Self-pay | Admitting: *Deleted

## 2021-01-07 ENCOUNTER — Other Ambulatory Visit: Payer: Self-pay

## 2021-01-07 ENCOUNTER — Inpatient Hospital Stay (HOSPITAL_BASED_OUTPATIENT_CLINIC_OR_DEPARTMENT_OTHER): Payer: Medicare PPO | Admitting: Hematology & Oncology

## 2021-01-07 VITALS — Wt 162.4 lb

## 2021-01-07 VITALS — BP 153/58 | HR 68 | Temp 98.1°F | Resp 17

## 2021-01-07 DIAGNOSIS — D509 Iron deficiency anemia, unspecified: Secondary | ICD-10-CM | POA: Diagnosis not present

## 2021-01-07 DIAGNOSIS — D500 Iron deficiency anemia secondary to blood loss (chronic): Secondary | ICD-10-CM

## 2021-01-07 DIAGNOSIS — K909 Intestinal malabsorption, unspecified: Secondary | ICD-10-CM

## 2021-01-07 DIAGNOSIS — R160 Hepatomegaly, not elsewhere classified: Secondary | ICD-10-CM

## 2021-01-07 DIAGNOSIS — C23 Malignant neoplasm of gallbladder: Secondary | ICD-10-CM

## 2021-01-07 DIAGNOSIS — Z79899 Other long term (current) drug therapy: Secondary | ICD-10-CM | POA: Insufficient documentation

## 2021-01-07 DIAGNOSIS — C787 Secondary malignant neoplasm of liver and intrahepatic bile duct: Secondary | ICD-10-CM | POA: Insufficient documentation

## 2021-01-07 DIAGNOSIS — Z5112 Encounter for antineoplastic immunotherapy: Secondary | ICD-10-CM | POA: Insufficient documentation

## 2021-01-07 DIAGNOSIS — Z5111 Encounter for antineoplastic chemotherapy: Secondary | ICD-10-CM | POA: Insufficient documentation

## 2021-01-07 LAB — CBC WITH DIFFERENTIAL/PLATELET
Abs Immature Granulocytes: 0.13 10*3/uL — ABNORMAL HIGH (ref 0.00–0.07)
Basophils Absolute: 0 10*3/uL (ref 0.0–0.1)
Basophils Relative: 1 %
Eosinophils Absolute: 0 10*3/uL (ref 0.0–0.5)
Eosinophils Relative: 2 %
HCT: 26 % — ABNORMAL LOW (ref 36.0–46.0)
Hemoglobin: 8 g/dL — ABNORMAL LOW (ref 12.0–15.0)
Immature Granulocytes: 6 %
Lymphocytes Relative: 23 %
Lymphs Abs: 0.5 10*3/uL — ABNORMAL LOW (ref 0.7–4.0)
MCH: 30.5 pg (ref 26.0–34.0)
MCHC: 30.8 g/dL (ref 30.0–36.0)
MCV: 99.2 fL (ref 80.0–100.0)
Monocytes Absolute: 0.9 10*3/uL (ref 0.1–1.0)
Monocytes Relative: 40 %
Neutro Abs: 0.6 10*3/uL — ABNORMAL LOW (ref 1.7–7.7)
Neutrophils Relative %: 28 %
Platelets: 231 10*3/uL (ref 150–400)
RBC: 2.62 MIL/uL — ABNORMAL LOW (ref 3.87–5.11)
RDW: 20.1 % — ABNORMAL HIGH (ref 11.5–15.5)
WBC: 2.2 10*3/uL — ABNORMAL LOW (ref 4.0–10.5)
nRBC: 3.7 % — ABNORMAL HIGH (ref 0.0–0.2)

## 2021-01-07 LAB — COMPREHENSIVE METABOLIC PANEL
ALT: 9 U/L (ref 0–44)
AST: 14 U/L — ABNORMAL LOW (ref 15–41)
Albumin: 3.4 g/dL — ABNORMAL LOW (ref 3.5–5.0)
Alkaline Phosphatase: 76 U/L (ref 38–126)
Anion gap: 7 (ref 5–15)
BUN: 35 mg/dL — ABNORMAL HIGH (ref 8–23)
CO2: 22 mmol/L (ref 22–32)
Calcium: 8.8 mg/dL — ABNORMAL LOW (ref 8.9–10.3)
Chloride: 108 mmol/L (ref 98–111)
Creatinine, Ser: 2.25 mg/dL — ABNORMAL HIGH (ref 0.44–1.00)
GFR, Estimated: 22 mL/min — ABNORMAL LOW (ref >60)
Glucose, Bld: 137 mg/dL — ABNORMAL HIGH (ref 70–99)
Potassium: 4.1 mmol/L (ref 3.5–5.1)
Sodium: 137 mmol/L (ref 135–145)
Total Bilirubin: 0.3 mg/dL (ref 0.3–1.2)
Total Protein: 6.3 g/dL — ABNORMAL LOW (ref 6.5–8.1)

## 2021-01-07 LAB — TSH: TSH: 1.645 u[IU]/mL (ref 0.308–3.960)

## 2021-01-07 MED ORDER — SODIUM CHLORIDE 0.9% FLUSH
3.0000 mL | Freq: Once | INTRAVENOUS | Status: DC | PRN
  Filled 2021-01-07: qty 10

## 2021-01-07 MED ORDER — SODIUM CHLORIDE 0.9 % IV SOLN
Freq: Once | INTRAVENOUS | Status: DC
  Filled 2021-01-07: qty 250

## 2021-01-07 MED ORDER — SODIUM CHLORIDE 0.9 % IV SOLN
300.0000 mg | Freq: Once | INTRAVENOUS | Status: AC
  Administered 2021-01-07: 300 mg via INTRAVENOUS
  Filled 2021-01-07: qty 10

## 2021-01-07 MED ORDER — SODIUM CHLORIDE 0.9 % IV SOLN
150.0000 mg | Freq: Once | INTRAVENOUS | Status: AC
  Administered 2021-01-07: 150 mg via INTRAVENOUS
  Filled 2021-01-07: qty 150

## 2021-01-07 MED ORDER — SODIUM CHLORIDE 0.9 % IV SOLN
1500.0000 mg | Freq: Once | INTRAVENOUS | Status: AC
  Administered 2021-01-07: 1500 mg via INTRAVENOUS
  Filled 2021-01-07: qty 30

## 2021-01-07 MED ORDER — SODIUM CHLORIDE 0.9 % IV SOLN
Freq: Once | INTRAVENOUS | Status: AC
  Filled 2021-01-07: qty 250

## 2021-01-07 MED ORDER — PALONOSETRON HCL INJECTION 0.25 MG/5ML
INTRAVENOUS | Status: AC
  Filled 2021-01-07: qty 5

## 2021-01-07 MED ORDER — SODIUM CHLORIDE 0.9 % IV SOLN
240.0000 mg | Freq: Once | INTRAVENOUS | Status: AC
  Administered 2021-01-07: 240 mg via INTRAVENOUS
  Filled 2021-01-07: qty 24

## 2021-01-07 MED ORDER — TRIAMTERENE-HCTZ 75-50 MG PO TABS: 1.0000 | ORAL_TABLET | Freq: Every day | ORAL | 4 refills | Status: DC

## 2021-01-07 MED ORDER — SODIUM CHLORIDE 0.9% FLUSH
10.0000 mL | Freq: Once | INTRAVENOUS | Status: DC | PRN
  Filled 2021-01-07: qty 10

## 2021-01-07 MED ORDER — FERUMOXYTOL INJECTION 510 MG/17 ML: 510.0000 mg | Freq: Once | INTRAVENOUS | Status: DC

## 2021-01-07 MED ORDER — ALTEPLASE 2 MG IJ SOLR
2.0000 mg | Freq: Once | INTRAMUSCULAR | Status: DC | PRN
  Filled 2021-01-07: qty 2

## 2021-01-07 MED ORDER — SODIUM CHLORIDE 0.9 % IV SOLN
800.0000 mg/m2 | Freq: Once | INTRAVENOUS | Status: AC
  Administered 2021-01-07: 1368 mg via INTRAVENOUS
  Filled 2021-01-07: qty 26.3

## 2021-01-07 MED ORDER — SODIUM CHLORIDE 0.9% FLUSH
10.0000 mL | INTRAVENOUS | Status: DC | PRN
  Administered 2021-01-07: 10 mL
  Filled 2021-01-07: qty 10

## 2021-01-07 MED ORDER — FAMOTIDINE 20 MG IN NS 100 ML IVPB
20.0000 mg | Freq: Once | INTRAVENOUS | Status: AC
  Administered 2021-01-07: 20 mg via INTRAVENOUS
  Filled 2021-01-07: qty 100

## 2021-01-07 MED ORDER — DIPHENHYDRAMINE HCL 50 MG/ML IJ SOLN
25.0000 mg | Freq: Once | INTRAMUSCULAR | Status: AC
Start: 2021-01-07 — End: 2021-01-07
  Administered 2021-01-07: 25 mg via INTRAVENOUS

## 2021-01-07 MED ORDER — SODIUM CHLORIDE 0.9 % IV SOLN
10.0000 mg | Freq: Once | INTRAVENOUS | Status: AC
  Administered 2021-01-07: 10 mg via INTRAVENOUS
  Filled 2021-01-07: qty 10

## 2021-01-07 MED ORDER — DIPHENHYDRAMINE HCL 50 MG/ML IJ SOLN
INTRAMUSCULAR | Status: AC
  Filled 2021-01-07: qty 1

## 2021-01-07 MED ORDER — HEPARIN SOD (PORK) LOCK FLUSH 100 UNIT/ML IV SOLN
250.0000 [IU] | Freq: Once | INTRAVENOUS | Status: DC | PRN
  Filled 2021-01-07: qty 5

## 2021-01-07 MED ORDER — PALONOSETRON HCL INJECTION 0.25 MG/5ML
0.2500 mg | Freq: Once | INTRAVENOUS | Status: AC
  Administered 2021-01-07: 0.25 mg via INTRAVENOUS

## 2021-01-07 MED ORDER — HEPARIN SOD (PORK) LOCK FLUSH 100 UNIT/ML IV SOLN
500.0000 [IU] | Freq: Once | INTRAVENOUS | Status: AC | PRN
  Administered 2021-01-07: 500 [IU]
  Filled 2021-01-07: qty 5

## 2021-01-07 MED ORDER — HEPARIN SOD (PORK) LOCK FLUSH 100 UNIT/ML IV SOLN
500.0000 [IU] | Freq: Once | INTRAVENOUS | Status: DC | PRN
  Filled 2021-01-07: qty 5

## 2021-01-07 NOTE — Progress Notes (Signed)
Initially patient did not have follow up provider appointment. Reviewed LOS and patient should have been scheduled to see MD. Dr Marin Olp will work her into today's schedule.   Oncology Nurse Navigator Documentation  Oncology Nurse Navigator Flowsheets 01/07/2021  Abnormal Finding Date -  Planned Course of Treatment -  Phase of Treatment -  Chemotherapy Actual Start Date: -  Navigator Follow Up Date: -  Navigator Follow Up Reason: -  Navigator Location [facility]  Referral Date to RadOnc/MedOnc -  Navigator Encounter Type Treatment;Appt/Treatment Plan Review  Telephone -  Treatment Initiated Date -  Patient Visit Type MedOnc  Treatment Phase Active Tx  Barriers/Navigation Needs Coordination of Care;Education  Education -  Interventions Coordination of Care  Acuity Level 2-Minimal Needs (1-2 Barriers Identified)  Referrals -  Coordination of Care Appts  Education Method -  Support Groups/Services Friends and Family  Time Spent with Patient 30

## 2021-01-07 NOTE — Progress Notes (Signed)
Patient [place] .6.  Dr Marin Olp still wants to treat patient because Monocytes are within adequate range.  Ok to treat today

## 2021-01-07 NOTE — Patient Instructions (Signed)

## 2021-01-07 NOTE — Patient Instructions (Signed)
[facility]  Discharge Instructions: Thank you for choosing [facility] to provide your oncology and hematology care.   If you have a lab appointment with the [facility], please go directly to the [facility] and check in at the registration area.  Wear comfortable clothing and clothing appropriate for easy access to any Portacath or PICC line.   We strive to give you quality time with your provider. You may need to reschedule your appointment if you arrive late (15 or more minutes).  Arriving late affects you and other patients whose appointments are after yours.  Also, if you miss three or more appointments without notifying the office, you may be dismissed from the clinic at the provider's discretion.      For prescription refill requests, have your pharmacy contact our office and allow 72 hours for refills to be completed.    Today you received the following chemotherapy and/or immunotherapy agents Carboplatin, Gemzar, Imfinzi      To help prevent nausea and vomiting after your treatment, we encourage you to take your nausea medication as directed.  BELOW ARE SYMPTOMS THAT SHOULD BE REPORTED IMMEDIATELY: . *FEVER GREATER THAN 100.4 F (38 C) OR HIGHER . *CHILLS OR SWEATING . *NAUSEA AND VOMITING THAT IS NOT CONTROLLED WITH YOUR NAUSEA MEDICATION . *UNUSUAL SHORTNESS OF BREATH . *UNUSUAL BRUISING OR BLEEDING . *URINARY PROBLEMS (pain or burning when urinating, or frequent urination) . *BOWEL PROBLEMS (unusual diarrhea, constipation, pain near the anus) . TENDERNESS IN MOUTH AND THROAT WITH OR WITHOUT PRESENCE OF ULCERS (sore throat, sores in mouth, or a toothache) . UNUSUAL RASH, SWELLING OR PAIN  . UNUSUAL VAGINAL DISCHARGE OR ITCHING   Items with * indicate a potential emergency and should be followed up as soon as possible or go to the Emergency Department if any problems should occur.  Please show the CHEMOTHERAPY ALERT CARD or  IMMUNOTHERAPY ALERT CARD at check-in to the Emergency Department and triage nurse. Should you have questions after your visit or need to cancel or reschedule your appointment, please contact [facility]  [phone] and follow the prompts.  Office hours are 8:00 a.m. to 4:30 p.m. Monday - Friday. Please note that voicemails left after 4:00 p.m. may not be returned until the following business day.  We are closed weekends and major holidays. You have access to a nurse at all times for urgent questions. Please call the main number to the clinic [phone] and follow the prompts.  For any non-urgent questions, you may also contact your provider using MyChart. We now offer e-Visits for anyone 2 and older to request care online for non-urgent symptoms. For details visit [email or web address].   Also download the MyChart app! Go to the app store, search "MyChart", open the app, select [facility], and log in with your MyChart username and password.  Due to Covid, a mask is required upon entering the hospital/clinic. If you do not have a mask, one will be given to you upon arrival. For doctor visits, patients may have 1 support person aged 80 or older with them. For treatment visits, patients cannot have anyone with them due to current Covid guidelines and our immunocompromised population.

## 2021-01-08 LAB — T4: T4, Total: 7.8 ug/dL (ref 4.5–12.0)

## 2021-01-10 NOTE — Progress Notes (Signed)
Hematology and Oncology Follow Up Visit  Vanessa Garrett [id number] 1941/07/26 80 y.o. 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   Interim History:  Vanessa Garrett is here today for follow-up and treatment.  She actually is quite good.  She is eating better.  I think maybe the Megace has helped.  She does have some swelling in her legs.  This could be from the Megace.  She supposed be taking Maxide.  I am unsure if she is taking Maxide.  She has had no problems with her appetite.  She has had no nausea or vomiting.  She has had no bleeding.  She has had no change in bowel or bladder habits.  Her last CA 19-9 was down to 1880.  It is possible that the Durvalumab has been helping along with the chemotherapy.  Her iron studies that were done recently showed iron saturation of only 15%.  We did go ahead and give her a dose of Venofer today.  We will treat her today.  I realize her Prentiss is down but her monocytes are up quite a bit.  Because of this, we will treat her and then hold on treatment next week.  This way, I think she will be able to go up to [place] for the [facility] Day weekend and hopefully feel well.  She has had no problems with pain.  There is no fever.  Overall, I say performance status is probably ECOG 1.   Medications:  Allergies as of 01/07/2021   No Known Allergies     Medication List       Accurate as of Jan 07, 2021 11:59 PM. If you have any questions, ask your nurse or doctor.        dexamethasone 4 MG tablet Commonly known as: DECADRON Take  2 tablets (8 mg total) by mouth daily. Start the day after carboplatin chemotherapy for 3 days.   dronabinol 2.5 MG capsule Commonly known as: MARINOL Take 1 capsule (2.5 mg total) by mouth 2 (two) times daily before a meal.   lidocaine-prilocaine cream Commonly known as: EMLA Apply to affected area once   lidocaine-prilocaine cream Commonly known as: EMLA Apply to affected area once   LORazepam 0.5 MG tablet Commonly known as: Ativan Take 1 tablet (0.5 mg total) by mouth every 6 (six) hours as needed (Nausea or vomiting).   megestrol 400 MG/10ML suspension Commonly known as: MEGACE Take 10 mLs (400 mg total) by mouth 2 (two) times daily.   metoprolol tartrate 25 MG tablet Commonly known as: LOPRESSOR TAKE 1 TABLET(25 MG) BY MOUTH TWICE DAILY   NON FORMULARY Take 2 tablets by mouth daily. Mega Food - Blood Builder   NON FORMULARY 1.25 mg every other day. Beet Root Powder   ondansetron 8 MG tablet Commonly known as: Zofran Take 1 tablet (8 mg total) by mouth 2 (two) times daily as needed (Nausea or vomiting).   potassium chloride SA 20 MEQ tablet Commonly known as: KLOR-CON TAKE 1 TABLET BY MOUTH ONCE DAILY   prochlorperazine 10 MG tablet Commonly known as: COMPAZINE Take 1 tablet (10 mg total) by  mouth every 6 (six) hours as needed (Nausea or vomiting).   Retacrit 10000 UNIT/ML injection Generic drug: epoetin alfa-epbx Inject 1 mL (10,000 Units total) into the skin 3 (three) times a week.   S.S.S. TONIC PO Take 45 mLs by mouth every other day.   triamterene-hydrochlorothiazide 75-50 MG tablet Commonly known as: MAXZIDE Take 1 tablet by mouth daily.   vitamin B-12 1000 MCG tablet Commonly known as: CYANOCOBALAMIN Take 1,000 mcg by mouth daily.   VITAMIN D PO Take 2 tablets by mouth daily.       Allergies: No Known Allergies  Past Medical History, Surgical history, Social history, and Family History were reviewed and updated.  Review of  Systems: Review of Systems  Constitutional: Positive for malaise/fatigue and weight loss.  HENT: Negative.   Eyes: Negative.   Respiratory: Negative.   Cardiovascular: Positive for leg swelling.  Gastrointestinal: Positive for nausea.  Genitourinary: Negative.   Musculoskeletal: Negative.   Skin: Negative.   Neurological: Negative.   Endo/Heme/Allergies: Negative.   Psychiatric/Behavioral: Negative.    Marland Kitchen   Physical Exam:  weight is 162 lb 6.4 oz (73.7 kg).   Wt Readings from Last 3 Encounters:  01/07/21 162 lb 6.4 oz (73.7 kg)  12/17/20 151 lb (68.5 kg)  11/19/20 151 lb (68.5 kg)    Physical Exam Vitals reviewed.  HENT:     Head: Normocephalic and atraumatic.  Eyes:     Pupils: Pupils are equal, round, and reactive to light.  Cardiovascular:     Rate and Rhythm: Normal rate and regular rhythm.     Heart sounds: Normal heart sounds.  Pulmonary:     Effort: Pulmonary effort is normal.     Breath sounds: Normal breath sounds.  Abdominal:     General: Bowel sounds are normal.     Palpations: Abdomen is soft.  Musculoskeletal:        General: No tenderness or deformity. Normal range of motion.     Cervical back: Normal range of motion.     Comments: Her extremities does show some swelling in the legs.  There might be a little bit more swelling in the right leg than in the left leg.  Lymphadenopathy:     Cervical: No cervical adenopathy.  Skin:    General: Skin is warm and dry.     Findings: No erythema or rash.  Neurological:     Mental Status: She is alert and oriented to person, place, and time.  Psychiatric:        Behavior: Behavior normal.        Thought Content: Thought content normal.        Judgment: Judgment normal.      Lab Results  Component Value Date   WBC 2.2 (L) 01/07/2021   HGB 8.0 (L) 01/07/2021   HCT 26.0 (L) 01/07/2021   MCV 99.2 01/07/2021   PLT 231 01/07/2021   Lab Results  Component Value Date   FERRITIN 1,971 (H) 12/17/2020    IRON 29 (L) 12/17/2020   TIBC 193 (L) 12/17/2020   UIBC 164 12/17/2020   IRONPCTSAT 15 (L) 12/17/2020   Lab Results  Component Value Date   RETICCTPCT 2.4 12/17/2020   RBC 2.62 (L) 01/07/2021   No results found for: KPAFRELGTCHN, LAMBDASER, KAPLAMBRATIO No results found for: IGGSERUM, IGA, IGMSERUM No results found for: TOTALPROTELP, ALBUMINELP, A1GS, A2GS, BETS, Carloyn Jaeger, MSPIKE, SPEI   Chemistry      Component Value Date/Time   NA 137 01/07/2021  0951   K 4.1 01/07/2021 0951   CL 108 01/07/2021 0951   CO2 22 01/07/2021 0951   BUN 35 (H) 01/07/2021 0951   CREATININE 2.25 (H) 01/07/2021 0951   CREATININE 2.15 (H) 12/17/2020 1005      Component Value Date/Time   CALCIUM 8.8 (L) 01/07/2021 0951   ALKPHOS 76 01/07/2021 0951   AST 14 (L) 01/07/2021 0951   AST 18 12/17/2020 1005   ALT 9 01/07/2021 0951   ALT 7 12/17/2020 1005   BILITOT 0.3 01/07/2021 0951   BILITOT 0.4 12/17/2020 1005       Impression and Plan: Vanessa Garrett is a very pleasant 80 yo African American female with stage IV adenocarcinoma of the gallbladder.   I am glad to see that her quality of life is doing better.  I am glad that she is eating better.  The Megace seems to be helping her.  We will go ahead with the chemotherapy today.  She does get Retacrit at home.  I think her family does a 3 times a week.  Hopefully by given her iron, we will be able to get her hemoglobin little bit higher.  Hopefully this will help improve her performance status.  Hopefully it will also help decrease the swelling in her legs.  Again, we will hold on day #8 of treatment for this cycle.  I just want her to be able to have a decent blood counts so she will be able to go up to [place] over [place] Day weekend.  We will plan to get her back to see [place] in early June.  After this next cycle of treatment, we will then plan for another scan.  Hopefully, the CA 19-9 will continue to improve.    Volanda Napoleon,  MD 5/16/20227:13 AM

## 2021-01-11 ENCOUNTER — Encounter: Payer: Self-pay | Admitting: *Deleted

## 2021-01-11 ENCOUNTER — Telehealth: Payer: Self-pay | Admitting: *Deleted

## 2021-01-11 NOTE — Progress Notes (Signed)
Received a call from patient's daughter, [name]. Her mother has no follow up appointments and she believes that she should have treatment this week.   Reviewed Dr Antonieta Pert note. He is holding treatment this week and will resume as normally scheduled for her next cycle. Informed daughter that she will not get treated this week, but she does need to be scheduled for her follow up around 01/28/21.  Message sent to scheduling.  Oncology Nurse Navigator Documentation  Oncology Nurse Navigator Flowsheets 01/11/2021  Abnormal Finding Date -  Planned Course of Treatment -  Phase of Treatment -  Chemotherapy Actual Start Date: -  Navigator Follow Up Date: 01/28/2021  Navigator Follow Up Reason: Follow-up Appointment;Chemotherapy  Navigator Location [facility]  Referral Date to RadOnc/MedOnc -  Navigator Encounter Type Telephone  Telephone Appt Confirmation/Clarification;Incoming Call  Treatment Initiated Date -  Patient Visit Type MedOnc  Treatment Phase Active Tx  Barriers/Navigation Needs Coordination of Care;Education  Education Other  Interventions Coordination of Care;Education;Psycho-Social Support  Acuity Level 2-Minimal Needs (1-2 Barriers Identified)  Referrals -  Coordination of Care Appts  Education Method Verbal  Support Groups/Services Friends and Family  Time Spent with Patient 15

## 2021-01-11 NOTE — Telephone Encounter (Signed)
Per 01/07/21 los - called and unable to lvm - mailbox full - mailed calendar - view mychart

## 2021-01-14 ENCOUNTER — Ambulatory Visit: Payer: Medicare PPO

## 2021-01-18 ENCOUNTER — Encounter: Payer: Self-pay | Admitting: *Deleted

## 2021-01-18 ENCOUNTER — Other Ambulatory Visit: Payer: Self-pay | Admitting: *Deleted

## 2021-01-18 DIAGNOSIS — C23 Malignant neoplasm of gallbladder: Secondary | ICD-10-CM

## 2021-01-18 DIAGNOSIS — Z789 Other specified health status: Secondary | ICD-10-CM

## 2021-01-18 DIAGNOSIS — D631 Anemia in chronic kidney disease: Secondary | ICD-10-CM

## 2021-01-18 DIAGNOSIS — IMO0001 Reserved for inherently not codable concepts without codable children: Secondary | ICD-10-CM

## 2021-01-18 DIAGNOSIS — D500 Iron deficiency anemia secondary to blood loss (chronic): Secondary | ICD-10-CM

## 2021-01-18 MED ORDER — RETACRIT 10000 UNIT/ML IJ SOLN: 10000.0000 [IU] | INTRAMUSCULAR | 3 refills | Status: DC

## 2021-01-18 NOTE — Progress Notes (Signed)
Oncology Nurse Navigator Documentation  Oncology Nurse Navigator Flowsheets 01/18/2021  Abnormal Finding Date -  Planned Course of Treatment -  Phase of Treatment -  Chemotherapy Actual Start Date: -  Navigator Follow Up Date: 01/28/2021  Navigator Follow Up Reason: Follow-up Appointment  Navigator Location [facility]  Referral Date to RadOnc/MedOnc -  Navigator Encounter Type Telephone  Telephone Medication Assistance;Incoming Call  Treatment Initiated Date -  Patient Visit Type MedOnc  Treatment Phase Active Tx  Barriers/Navigation Needs Coordination of Care;Education  Education Other  Interventions Medication Assistance;Psycho-Social Support  Acuity Level 2-Minimal Needs (1-2 Barriers Identified)  Referrals -  Coordination of Care -  Education Method Verbal  Support Groups/Services Friends and Family  Time Spent with Patient 15

## 2021-01-21 ENCOUNTER — Ambulatory Visit: Payer: Medicare PPO

## 2021-01-28 ENCOUNTER — Encounter: Payer: Self-pay | Admitting: *Deleted

## 2021-01-28 ENCOUNTER — Inpatient Hospital Stay: Payer: Medicare PPO

## 2021-01-28 ENCOUNTER — Ambulatory Visit: Payer: Medicare PPO

## 2021-01-28 ENCOUNTER — Inpatient Hospital Stay: Payer: Medicare PPO | Attending: Hematology & Oncology | Admitting: Hematology & Oncology

## 2021-01-28 ENCOUNTER — Other Ambulatory Visit: Payer: Self-pay | Admitting: Oncology

## 2021-01-28 ENCOUNTER — Other Ambulatory Visit: Payer: Medicare PPO

## 2021-01-28 ENCOUNTER — Encounter: Payer: Self-pay | Admitting: Hematology & Oncology

## 2021-01-28 ENCOUNTER — Other Ambulatory Visit: Payer: Self-pay

## 2021-01-28 VITALS — BP 167/61 | HR 70 | Temp 98.6°F | Resp 20 | Wt 156.0 lb

## 2021-01-28 DIAGNOSIS — C787 Secondary malignant neoplasm of liver and intrahepatic bile duct: Secondary | ICD-10-CM | POA: Insufficient documentation

## 2021-01-28 DIAGNOSIS — D6481 Anemia due to antineoplastic chemotherapy: Secondary | ICD-10-CM | POA: Diagnosis not present

## 2021-01-28 DIAGNOSIS — Z5112 Encounter for antineoplastic immunotherapy: Secondary | ICD-10-CM | POA: Diagnosis not present

## 2021-01-28 DIAGNOSIS — T451X5A Adverse effect of antineoplastic and immunosuppressive drugs, initial encounter: Secondary | ICD-10-CM | POA: Diagnosis not present

## 2021-01-28 DIAGNOSIS — C779 Secondary and unspecified malignant neoplasm of lymph node, unspecified: Secondary | ICD-10-CM | POA: Diagnosis not present

## 2021-01-28 DIAGNOSIS — R160 Hepatomegaly, not elsewhere classified: Secondary | ICD-10-CM

## 2021-01-28 DIAGNOSIS — Z5111 Encounter for antineoplastic chemotherapy: Secondary | ICD-10-CM | POA: Diagnosis present

## 2021-01-28 DIAGNOSIS — C23 Malignant neoplasm of gallbladder: Secondary | ICD-10-CM

## 2021-01-28 DIAGNOSIS — Z79899 Other long term (current) drug therapy: Secondary | ICD-10-CM | POA: Insufficient documentation

## 2021-01-28 DIAGNOSIS — M7989 Other specified soft tissue disorders: Secondary | ICD-10-CM | POA: Diagnosis not present

## 2021-01-28 LAB — CBC WITH DIFFERENTIAL/PLATELET
Abs Immature Granulocytes: 0.02 10*3/uL (ref 0.00–0.07)
Basophils Absolute: 0 10*3/uL (ref 0.0–0.1)
Basophils Relative: 0 %
Eosinophils Absolute: 0 10*3/uL (ref 0.0–0.5)
Eosinophils Relative: 0 %
HCT: 25.9 % — ABNORMAL LOW (ref 36.0–46.0)
Hemoglobin: 8.3 g/dL — ABNORMAL LOW (ref 12.0–15.0)
Immature Granulocytes: 1 %
Lymphocytes Relative: 15 %
Lymphs Abs: 0.6 10*3/uL — ABNORMAL LOW (ref 0.7–4.0)
MCH: 31.1 pg (ref 26.0–34.0)
MCHC: 32 g/dL (ref 30.0–36.0)
MCV: 97 fL (ref 80.0–100.0)
Monocytes Absolute: 0.8 10*3/uL (ref 0.1–1.0)
Monocytes Relative: 21 %
Neutro Abs: 2.2 10*3/uL (ref 1.7–7.7)
Neutrophils Relative %: 63 %
Platelets: 204 10*3/uL (ref 150–400)
RBC: 2.67 MIL/uL — ABNORMAL LOW (ref 3.87–5.11)
RDW: 21.4 % — ABNORMAL HIGH (ref 11.5–15.5)
WBC: 3.6 10*3/uL — ABNORMAL LOW (ref 4.0–10.5)
nRBC: 0 % (ref 0.0–0.2)

## 2021-01-28 LAB — COMPREHENSIVE METABOLIC PANEL
ALT: 9 U/L (ref 0–44)
AST: 15 U/L (ref 15–41)
Albumin: 4 g/dL (ref 3.5–5.0)
Alkaline Phosphatase: 113 U/L (ref 38–126)
Anion gap: 11 (ref 5–15)
BUN: 29 mg/dL — ABNORMAL HIGH (ref 8–23)
CO2: 22 mmol/L (ref 22–32)
Calcium: 9.5 mg/dL (ref 8.9–10.3)
Chloride: 105 mmol/L (ref 98–111)
Creatinine, Ser: 1.8 mg/dL — ABNORMAL HIGH (ref 0.44–1.00)
GFR, Estimated: 28 mL/min — ABNORMAL LOW (ref >60)
Glucose, Bld: 110 mg/dL — ABNORMAL HIGH (ref 70–99)
Potassium: 3.6 mmol/L (ref 3.5–5.1)
Sodium: 138 mmol/L (ref 135–145)
Total Bilirubin: 0.4 mg/dL (ref 0.3–1.2)
Total Protein: 6.9 g/dL (ref 6.5–8.1)

## 2021-01-28 LAB — TSH: TSH: 1.713 u[IU]/mL (ref 0.308–3.960)

## 2021-01-28 MED ORDER — SODIUM CHLORIDE 0.9 % IV SOLN
Freq: Once | INTRAVENOUS | Status: AC
  Filled 2021-01-28: qty 250

## 2021-01-28 MED ORDER — DIPHENHYDRAMINE HCL 50 MG/ML IJ SOLN
INTRAMUSCULAR | Status: AC
  Filled 2021-01-28: qty 1

## 2021-01-28 MED ORDER — HEPARIN SOD (PORK) LOCK FLUSH 100 UNIT/ML IV SOLN
500.0000 [IU] | Freq: Once | INTRAVENOUS | Status: AC | PRN
  Administered 2021-01-28: 500 [IU]
  Filled 2021-01-28: qty 5

## 2021-01-28 MED ORDER — SODIUM CHLORIDE 0.9 % IV SOLN
1500.0000 mg | Freq: Once | INTRAVENOUS | Status: AC
  Administered 2021-01-28: 1500 mg via INTRAVENOUS
  Filled 2021-01-28: qty 30

## 2021-01-28 MED ORDER — PALONOSETRON HCL INJECTION 0.25 MG/5ML
0.2500 mg | Freq: Once | INTRAVENOUS | Status: AC
  Administered 2021-01-28: 0.25 mg via INTRAVENOUS

## 2021-01-28 MED ORDER — SODIUM CHLORIDE 0.9 % IV SOLN
800.0000 mg/m2 | Freq: Once | INTRAVENOUS | Status: AC
  Administered 2021-01-28: 1368 mg via INTRAVENOUS
  Filled 2021-01-28: qty 26.3

## 2021-01-28 MED ORDER — DEXAMETHASONE 4 MG PO TABS: 8.0000 mg | ORAL_TABLET | Freq: Every day | ORAL | 1 refills | Status: DC

## 2021-01-28 MED ORDER — SODIUM CHLORIDE 0.9 % IV SOLN
260.0000 mg | Freq: Once | INTRAVENOUS | Status: AC
  Administered 2021-01-28: 260 mg via INTRAVENOUS
  Filled 2021-01-28: qty 26

## 2021-01-28 MED ORDER — SODIUM CHLORIDE 0.9 % IV SOLN
150.0000 mg | Freq: Once | INTRAVENOUS | Status: AC
  Administered 2021-01-28: 150 mg via INTRAVENOUS
  Filled 2021-01-28: qty 150

## 2021-01-28 MED ORDER — DIPHENHYDRAMINE HCL 50 MG/ML IJ SOLN
25.0000 mg | Freq: Once | INTRAMUSCULAR | Status: AC
  Administered 2021-01-28: 25 mg via INTRAVENOUS

## 2021-01-28 MED ORDER — FAMOTIDINE 20 MG IN NS 100 ML IVPB
20.0000 mg | Freq: Once | INTRAVENOUS | Status: AC
  Administered 2021-01-28: 20 mg via INTRAVENOUS
  Filled 2021-01-28: qty 20

## 2021-01-28 MED ORDER — SODIUM CHLORIDE 0.9% FLUSH
10.0000 mL | INTRAVENOUS | Status: DC | PRN
  Administered 2021-01-28: 10 mL
  Filled 2021-01-28: qty 10

## 2021-01-28 MED ORDER — PALONOSETRON HCL INJECTION 0.25 MG/5ML
INTRAVENOUS | Status: AC
  Filled 2021-01-28: qty 5

## 2021-01-28 MED ORDER — SODIUM CHLORIDE 0.9 % IV SOLN
10.0000 mg | Freq: Once | INTRAVENOUS | Status: AC
  Administered 2021-01-28: 10 mg via INTRAVENOUS
  Filled 2021-01-28: qty 10

## 2021-01-28 NOTE — Progress Notes (Signed)
Reviewed labs with Dr. Marin Olp and ok to treat with creatinine 1.8

## 2021-01-28 NOTE — Progress Notes (Signed)
Hematology and Oncology Follow Up Visit  Vanessa Garrett [id number] April 21, 1941 80 y.o. 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   Interim History:  Vanessa Garrett is here today for follow-up and treatment.  Surprisingly, she comes by her self today.  Her family is on vacation.  She looks pretty good.  She still has swelling in her legs.  The swelling I think is multifactorial from her anemia and also from the Megace.  The Megace does help with the appetite.  She said that she is eating quite well.  She is having no problems with pain.  She is having no issues with nausea or vomiting.  There is no cough.  She gets the Retacrit at home.  Her last CA 19-9 was 1880.  This still seems to be improving.  She has had no issues with fever.  There is no headache.  She has little bit of constipation.  Overall, performance status is probably ECOG 2.   Medications:  Allergies as of 01/28/2021   No Known Allergies     Medication List       Accurate as of January 28, 2021  9:56 AM. If you have any questions, ask your nurse or doctor.        STOP taking these medications   megestrol 400 MG/10ML suspension Commonly known as: MEGACE Stopped by: Volanda Napoleon, MD   vitamin B-12 1000 MCG tablet Commonly known as: CYANOCOBALAMIN Stopped by: Volanda Napoleon, MD   VITAMIN D PO Stopped by: Volanda Napoleon, MD     TAKE these medications   dexamethasone 4 MG tablet Commonly known as: DECADRON Take 2 tablets (8 mg total) by mouth daily. Start the day after carboplatin  chemotherapy for 3 days.   dronabinol 2.5 MG capsule Commonly known as: MARINOL Take 1 capsule (2.5 mg total) by mouth 2 (two) times daily before a meal.   lidocaine-prilocaine cream Commonly known as: EMLA Apply to affected area once   LORazepam 0.5 MG tablet Commonly known as: Ativan Take 1 tablet (0.5 mg total) by mouth every 6 (six) hours as needed (Nausea or vomiting).   metoprolol tartrate 25 MG tablet Commonly known as: LOPRESSOR TAKE 1 TABLET(25 MG) BY MOUTH TWICE DAILY   NON FORMULARY Take 2 tablets by mouth daily. Mega Food - Blood Builder   NON FORMULARY 1.25 mg every other day. Beet Root Powder   ondansetron 8 MG tablet Commonly known as: Zofran Take 1 tablet (8 mg total) by mouth 2 (two) times daily as needed (Nausea or vomiting).   potassium chloride SA 20 MEQ tablet Commonly known as: KLOR-CON TAKE 1 TABLET BY MOUTH ONCE DAILY   prochlorperazine 10 MG tablet Commonly known as: COMPAZINE Take 1 tablet (10 mg total) by mouth every 6 (six) hours as needed (Nausea or vomiting).   Retacrit 10000 UNIT/ML injection Generic drug: epoetin alfa-epbx Inject 1 mL (10,000 Units total) into the skin 3 (three) times a week.   S.S.S. TONIC PO Take 45 mLs by mouth every other day.   triamterene-hydrochlorothiazide 75-50 MG tablet Commonly known as: MAXZIDE Take 1 tablet by mouth  daily.       Allergies: No Known Allergies  Past Medical History, Surgical history, Social history, and Family History were reviewed and updated.  Review of Systems: Review of Systems  Constitutional: Positive for malaise/fatigue and weight loss.  HENT: Negative.   Eyes: Negative.   Respiratory: Negative.   Cardiovascular: Positive for leg swelling.  Gastrointestinal: Positive for nausea.  Genitourinary: Negative.   Musculoskeletal: Negative.   Skin: Negative.   Neurological: Negative.   Endo/Heme/Allergies: Negative.   Psychiatric/Behavioral: Negative.    Marland Kitchen   Physical  Exam:  weight is 70.8 kg. Her oral temperature is 98.6 F (37 C). Her blood pressure is 167/61 (abnormal) and her pulse is 70. Her respiration is 20 and oxygen saturation is 100%.   Wt Readings from Last 3 Encounters:  01/28/21 70.8 kg  01/07/21 73.7 kg  12/17/20 68.5 kg    Physical Exam Vitals reviewed.  HENT:     Head: Normocephalic and atraumatic.  Eyes:     Pupils: Pupils are equal, round, and reactive to light.  Cardiovascular:     Rate and Rhythm: Normal rate and regular rhythm.     Heart sounds: Normal heart sounds.  Pulmonary:     Effort: Pulmonary effort is normal.     Breath sounds: Normal breath sounds.  Abdominal:     General: Bowel sounds are normal.     Palpations: Abdomen is soft.  Musculoskeletal:        General: No tenderness or deformity. Normal range of motion.     Cervical back: Normal range of motion.     Comments: Her extremities does show some swelling in the legs.  There might be a little bit more swelling in the right leg than in the left leg.  Lymphadenopathy:     Cervical: No cervical adenopathy.  Skin:    General: Skin is warm and dry.     Findings: No erythema or rash.  Neurological:     Mental Status: She is alert and oriented to person, place, and time.  Psychiatric:        Behavior: Behavior normal.        Thought Content: Thought content normal.        Judgment: Judgment normal.      Lab Results  Component Value Date   WBC 3.6 (L) 01/28/2021   HGB 8.3 (L) 01/28/2021   HCT 25.9 (L) 01/28/2021   MCV 97.0 01/28/2021   PLT 204 01/28/2021   Lab Results  Component Value Date   FERRITIN 1,971 (H) 12/17/2020   IRON 29 (L) 12/17/2020   TIBC 193 (L) 12/17/2020   UIBC 164 12/17/2020   IRONPCTSAT 15 (L) 12/17/2020   Lab Results  Component Value Date   RETICCTPCT 2.4 12/17/2020   RBC 2.67 (L) 01/28/2021   No results found for: KPAFRELGTCHN, LAMBDASER, KAPLAMBRATIO No results found for: IGGSERUM, IGA, IGMSERUM No results found for:  Odetta Pink, SPEI   Chemistry      Component Value Date/Time   NA 138 01/28/2021 0908   K 3.6 01/28/2021 0908   CL 105 01/28/2021 0908   CO2 22 01/28/2021 0908   BUN 29 (H) 01/28/2021 0908   CREATININE 1.80 (H) 01/28/2021 0908   CREATININE 2.15 (H) 12/17/2020 1005      Component Value Date/Time   CALCIUM 9.5 01/28/2021 0908   ALKPHOS 113 01/28/2021 0908   AST 15 01/28/2021 0908   AST 18 12/17/2020 1005  ALT 9 01/28/2021 0908   ALT 7 12/17/2020 1005   BILITOT 0.4 01/28/2021 0908   BILITOT 0.4 12/17/2020 1005       Impression and Plan: Ms. Birnbaum is a very pleasant 80 yo African American female with stage IV adenocarcinoma of the gallbladder.   Hopefully, we are still making some headway with respect to her cancer.  Her last MRI was done in March.  We will set her up with 1 before her next cycle of treatment.  I just wish we can do more about the swelling in her legs.  I think she is taking Maxide.  Overall, her quality of life seems to be doing pretty well right now.  I think this is what is important.  We will plan to get her back to see [place] in another 3 weeks.    Volanda Napoleon, MD 6/3/20229:56 AM

## 2021-01-28 NOTE — Progress Notes (Signed)
Oncology Nurse Navigator Documentation  Oncology Nurse Navigator Flowsheets 01/28/2021  Abnormal Finding Date -  Planned Course of Treatment -  Phase of Treatment -  Chemotherapy Actual Start Date: -  Navigator Follow Up Date: 02/17/2021  Navigator Follow Up Reason: Follow-up Appointment;Chemotherapy  Navigator Location [facility]  Referral Date to RadOnc/MedOnc -  Navigator Encounter Type Appt/Treatment Plan Review;Treatment  Telephone -  Treatment Initiated Date -  Patient Visit Type MedOnc  Treatment Phase Active Tx  Barriers/Navigation Needs Coordination of Care;Education  Education -  Interventions Psycho-Social Support  Acuity Level 2-Minimal Needs (1-2 Barriers Identified)  Referrals -  Coordination of Care -  Education Method -  Support Groups/Services Friends and Family  Time Spent with Patient 15

## 2021-01-28 NOTE — Patient Instructions (Signed)
[facility]  Discharge Instructions: Thank you for choosing [facility] to provide your oncology and hematology care.   If you have a lab appointment with the [facility], please go directly to the [facility] and check in at the registration area.  Wear comfortable clothing and clothing appropriate for easy access to any Portacath or PICC line.   We strive to give you quality time with your provider. You may need to reschedule your appointment if you arrive late (15 or more minutes).  Arriving late affects you and other patients whose appointments are after yours.  Also, if you miss three or more appointments without notifying the office, you may be dismissed from the clinic at the provider's discretion.      For prescription refill requests, have your pharmacy contact our office and allow 72 hours for refills to be completed.    Today you received the following chemotherapy and/or immunotherapy agents Carboplatin, Gemzar, Imfinzi      To help prevent nausea and vomiting after your treatment, we encourage you to take your nausea medication as directed.  BELOW ARE SYMPTOMS THAT SHOULD BE REPORTED IMMEDIATELY: . *FEVER GREATER THAN 100.4 F (38 C) OR HIGHER . *CHILLS OR SWEATING . *NAUSEA AND VOMITING THAT IS NOT CONTROLLED WITH YOUR NAUSEA MEDICATION . *UNUSUAL SHORTNESS OF BREATH . *UNUSUAL BRUISING OR BLEEDING . *URINARY PROBLEMS (pain or burning when urinating, or frequent urination) . *BOWEL PROBLEMS (unusual diarrhea, constipation, pain near the anus) . TENDERNESS IN MOUTH AND THROAT WITH OR WITHOUT PRESENCE OF ULCERS (sore throat, sores in mouth, or a toothache) . UNUSUAL RASH, SWELLING OR PAIN  . UNUSUAL VAGINAL DISCHARGE OR ITCHING   Items with * indicate a potential emergency and should be followed up as soon as possible or go to the Emergency Department if any problems should occur.  Please show the CHEMOTHERAPY ALERT CARD or  IMMUNOTHERAPY ALERT CARD at check-in to the Emergency Department and triage nurse. Should you have questions after your visit or need to cancel or reschedule your appointment, please contact [facility]  [phone] and follow the prompts.  Office hours are 8:00 a.m. to 4:30 p.m. Monday - Friday. Please note that voicemails left after 4:00 p.m. may not be returned until the following business day.  We are closed weekends and major holidays. You have access to a nurse at all times for urgent questions. Please call the main number to the clinic [phone] and follow the prompts.  For any non-urgent questions, you may also contact your provider using MyChart. We now offer e-Visits for anyone 80 and older to request care online for non-urgent symptoms. For details visit [email or web address].   Also download the MyChart app! Go to the app store, search "MyChart", open the app, select  [facility], and log in with your MyChart username and password.  Due to Covid, a mask is required upon entering the hospital/clinic. If you do not have a mask, one will be given to you upon arrival. For doctor visits, patients may have 1 support person aged 80 or older with them. For treatment visits, patients cannot have anyone with them due to current Covid guidelines and our immunocompromised population.

## 2021-01-29 LAB — T4: T4, Total: 10.4 ug/dL (ref 4.5–12.0)

## 2021-02-01 ENCOUNTER — Other Ambulatory Visit: Payer: Self-pay | Admitting: Hematology & Oncology

## 2021-02-01 DIAGNOSIS — C23 Malignant neoplasm of gallbladder: Secondary | ICD-10-CM

## 2021-02-03 ENCOUNTER — Other Ambulatory Visit: Payer: Self-pay | Admitting: Hematology & Oncology

## 2021-02-03 DIAGNOSIS — C23 Malignant neoplasm of gallbladder: Secondary | ICD-10-CM

## 2021-02-04 ENCOUNTER — Inpatient Hospital Stay: Payer: Medicare PPO

## 2021-02-04 ENCOUNTER — Other Ambulatory Visit: Payer: Self-pay

## 2021-02-04 DIAGNOSIS — R160 Hepatomegaly, not elsewhere classified: Secondary | ICD-10-CM

## 2021-02-04 DIAGNOSIS — C23 Malignant neoplasm of gallbladder: Secondary | ICD-10-CM

## 2021-02-04 DIAGNOSIS — Z5112 Encounter for antineoplastic immunotherapy: Secondary | ICD-10-CM | POA: Diagnosis not present

## 2021-02-04 LAB — CMP (CANCER CENTER ONLY)
ALT: 17 U/L (ref 0–44)
AST: 18 U/L (ref 15–41)
Albumin: 3.9 g/dL (ref 3.5–5.0)
Alkaline Phosphatase: 93 U/L (ref 38–126)
Anion gap: 9 (ref 5–15)
BUN: 41 mg/dL — ABNORMAL HIGH (ref 8–23)
CO2: 25 mmol/L (ref 22–32)
Calcium: 9.4 mg/dL (ref 8.9–10.3)
Chloride: 102 mmol/L (ref 98–111)
Creatinine: 1.82 mg/dL — ABNORMAL HIGH (ref 0.44–1.00)
GFR, Estimated: 28 mL/min — ABNORMAL LOW (ref >60)
Glucose, Bld: 106 mg/dL — ABNORMAL HIGH (ref 70–99)
Potassium: 3.4 mmol/L — ABNORMAL LOW (ref 3.5–5.1)
Sodium: 136 mmol/L (ref 135–145)
Total Bilirubin: 0.4 mg/dL (ref 0.3–1.2)
Total Protein: 6.4 g/dL — ABNORMAL LOW (ref 6.5–8.1)

## 2021-02-04 LAB — CBC WITH DIFFERENTIAL (CANCER CENTER ONLY)
Abs Immature Granulocytes: 0.01 10*3/uL (ref 0.00–0.07)
Basophils Absolute: 0 10*3/uL (ref 0.0–0.1)
Basophils Relative: 1 %
Eosinophils Absolute: 0 10*3/uL (ref 0.0–0.5)
Eosinophils Relative: 0 %
HCT: 24.4 % — ABNORMAL LOW (ref 36.0–46.0)
Hemoglobin: 7.7 g/dL — ABNORMAL LOW (ref 12.0–15.0)
Immature Granulocytes: 1 %
Lymphocytes Relative: 25 %
Lymphs Abs: 0.3 10*3/uL — ABNORMAL LOW (ref 0.7–4.0)
MCH: 30.3 pg (ref 26.0–34.0)
MCHC: 31.6 g/dL (ref 30.0–36.0)
MCV: 96.1 fL (ref 80.0–100.0)
Monocytes Absolute: 0.1 10*3/uL (ref 0.1–1.0)
Monocytes Relative: 6 %
Neutro Abs: 0.9 10*3/uL — ABNORMAL LOW (ref 1.7–7.7)
Neutrophils Relative %: 67 %
Platelet Count: 131 10*3/uL — ABNORMAL LOW (ref 150–400)
RBC: 2.54 MIL/uL — ABNORMAL LOW (ref 3.87–5.11)
RDW: 19.9 % — ABNORMAL HIGH (ref 11.5–15.5)
WBC Count: 1.4 10*3/uL — ABNORMAL LOW (ref 4.0–10.5)
nRBC: 0 % (ref 0.0–0.2)

## 2021-02-04 MED ORDER — SODIUM CHLORIDE 0.9 % IV SOLN
Freq: Once | INTRAVENOUS | Status: AC
  Filled 2021-02-04: qty 250

## 2021-02-04 MED ORDER — PROCHLORPERAZINE MALEATE 10 MG PO TABS
10.0000 mg | ORAL_TABLET | Freq: Once | ORAL | Status: AC
  Administered 2021-02-04: 10 mg via ORAL

## 2021-02-04 MED ORDER — HEPARIN SOD (PORK) LOCK FLUSH 100 UNIT/ML IV SOLN
500.0000 [IU] | Freq: Once | INTRAVENOUS | Status: AC | PRN
  Administered 2021-02-04: 500 [IU]
  Filled 2021-02-04: qty 5

## 2021-02-04 MED ORDER — SODIUM CHLORIDE 0.9 % IV SOLN
800.0000 mg/m2 | Freq: Once | INTRAVENOUS | Status: AC
  Administered 2021-02-04: 1368 mg via INTRAVENOUS
  Filled 2021-02-04: qty 26.3

## 2021-02-04 MED ORDER — PROCHLORPERAZINE MALEATE 10 MG PO TABS
ORAL_TABLET | ORAL | Status: AC
  Filled 2021-02-04: qty 1

## 2021-02-04 MED ORDER — SODIUM CHLORIDE 0.9% FLUSH
10.0000 mL | INTRAVENOUS | Status: DC | PRN
  Administered 2021-02-04: 10 mL
  Filled 2021-02-04: qty 10

## 2021-02-04 NOTE — Progress Notes (Signed)
Ok to treat with creatinine of 1.82 & ANC of 0.9 per DR Marin Olp. dph

## 2021-02-04 NOTE — Patient Instructions (Signed)
Implanted Port Home Guide An implanted port is a device that is placed under the skin. It is usually placed in the chest. The device can be used to give IV medicine, to take blood, or for dialysis. You may have an implanted port if: You need IV medicine that would be irritating to the small veins in your hands or arms. You need IV medicines, such as antibiotics, for a long period of time. You need IV nutrition for a long period of time. You need dialysis. When you have a port, your health care provider can choose to use the port instead of veins in your arms for these procedures. You may have fewer limitations when using a port than you would if you used other types of long-term IVs, and you will likely be able to return to normal activities afteryour incision heals. An implanted port has two main parts: Reservoir. The reservoir is the part where a needle is inserted to give medicines or draw blood. The reservoir is round. After it is placed, it appears as a small, raised area under your skin. Catheter. The catheter is a thin, flexible tube that connects the reservoir to a vein. Medicine that is inserted into the reservoir goes into the catheter and then into the vein. How is my port accessed? To access your port: A numbing cream may be placed on the skin over the port site. Your health care provider will put on a mask and sterile gloves. The skin over your port will be cleaned carefully with a germ-killing soap and allowed to dry. Your health care provider will gently pinch the port and insert a needle into it. Your health care provider will check for a blood return to make sure the port is in the vein and is not clogged. If your port needs to remain accessed to get medicine continuously (constant infusion), your health care provider will place a clear bandage (dressing) over the needle site. The dressing and needle will need to be changed every week, or as told by your health care provider. What  is flushing? Flushing helps keep the port from getting clogged. Follow instructions from your health care provider about how and when to flush the port. Ports are usually flushed with saline solution or a medicine called heparin. The need for flushing will depend on how the port is used: If the port is only used from time to time to give medicines or draw blood, the port may need to be flushed: Before and after medicines have been given. Before and after blood has been drawn. As part of routine maintenance. Flushing may be recommended every 4-6 weeks. If a constant infusion is running, the port may not need to be flushed. Throw away any syringes in a disposal container that is meant for sharp items (sharps container). You can buy a sharps container from a pharmacy, or you can make one by using an empty hard plastic bottle with a cover. How long will my port stay implanted? The port can stay in for as long as your health care provider thinks it is needed. When it is time for the port to come out, a surgery will be done to remove it. The surgery will be similar to the procedure that was done to putthe port in. Follow these instructions at home:  Flush your port as told by your health care provider. If you need an infusion over several days, follow instructions from your health care provider about how to take   care of your port site. Make sure you: Wash your hands with soap and water before you change your dressing. If soap and water are not available, use alcohol-based hand sanitizer. Change your dressing as told by your health care provider. Place any used dressings or infusion bags into a plastic bag. Throw that bag in the trash. Keep the dressing that covers the needle clean and dry. Do not get it wet. Do not use scissors or sharp objects near the tube. Keep the tube clamped, unless it is being used. Check your port site every day for signs of infection. Check for: Redness, swelling, or  pain. Fluid or blood. Pus or a bad smell. Protect the skin around the port site. Avoid wearing bra straps that rub or irritate the site. Protect the skin around your port from seat belts. Place a soft pad over your chest if needed. Bathe or shower as told by your health care provider. The site may get wet as long as you are not actively receiving an infusion. Return to your normal activities as told by your health care provider. Ask your health care provider what activities are safe for you. Carry a medical alert card or wear a medical alert bracelet at all times. This will let health care providers know that you have an implanted port in case of an emergency. Get help right away if: You have redness, swelling, or pain at the port site. You have fluid or blood coming from your port site. You have pus or a bad smell coming from the port site. You have a fever. Summary Implanted ports are usually placed in the chest for long-term IV access. Follow instructions from your health care provider about flushing the port and changing bandages (dressings). Take care of the area around your port by avoiding clothing that puts pressure on the area, and by watching for signs of infection. Protect the skin around your port from seat belts. Place a soft pad over your chest if needed. Get help right away if you have a fever or you have redness, swelling, pain, drainage, or a bad smell at the port site. This information is not intended to replace advice given to you by your health care provider. Make sure you discuss any questions you have with your healthcare provider. Document Revised: 12/29/2019 Document Reviewed: 12/29/2019 Elsevier Patient Education  2022 Elsevier Inc.  

## 2021-02-04 NOTE — Patient Instructions (Signed)
Gemcitabine injection What is this medication? GEMCITABINE (jem SYE ta been) is a chemotherapy drug. This medicine is used to treat many types of cancer like breast cancer, lung cancer, pancreatic cancer,and ovarian cancer. This medicine may be used for other purposes; ask your health care provider orpharmacist if you have questions. COMMON BRAND NAME(S): Gemzar, Infugem What should I tell my care team before I take this medication? They need to know if you have any of these conditions: blood disorders infection kidney disease liver disease lung or breathing disease, like asthma recent or ongoing radiation therapy an unusual or allergic reaction to gemcitabine, other chemotherapy, other medicines, foods, dyes, or preservatives pregnant or trying to get pregnant breast-feeding How should I use this medication? This drug is given as an infusion into a vein. It is administered in a hospitalor clinic by a specially trained health care professional. Talk to your pediatrician regarding the use of this medicine in children.Special care may be needed. Overdosage: If you think you have taken too much of this medicine contact apoison control center or emergency room at once. NOTE: This medicine is only for you. Do not share this medicine with others. What if I miss a dose? It is important not to miss your dose. Call your doctor or health careprofessional if you are unable to keep an appointment. What may interact with this medication? medicines to increase blood counts like filgrastim, pegfilgrastim, sargramostim some other chemotherapy drugs like cisplatin vaccines Talk to your doctor or health care professional before taking any of thesemedicines: acetaminophen aspirin ibuprofen ketoprofen naproxen This list may not describe all possible interactions. Give your health care provider a list of all the medicines, herbs, non-prescription drugs, or dietary supplements you use. Also tell them if  you smoke, drink alcohol, or use illegaldrugs. Some items may interact with your medicine. What should I watch for while using this medication? Visit your doctor for checks on your progress. This drug may make you feel generally unwell. This is not uncommon, as chemotherapy can affect healthy cells as well as cancer cells. Report any side effects. Continue your course oftreatment even though you feel ill unless your doctor tells you to stop. In some cases, you may be given additional medicines to help with side effects.Follow all directions for their use. Call your doctor or health care professional for advice if you get a fever, chills or sore throat, or other symptoms of a cold or flu. Do not treat yourself. This drug decreases your body's ability to fight infections. Try toavoid being around people who are sick. This medicine may increase your risk to bruise or bleed. Call your doctor orhealth care professional if you notice any unusual bleeding. Be careful brushing and flossing your teeth or using a toothpick because you may get an infection or bleed more easily. If you have any dental work done,tell your dentist you are receiving this medicine. Avoid taking products that contain aspirin, acetaminophen, ibuprofen, naproxen, or ketoprofen unless instructed by your doctor. These medicines may hide afever. Do not become pregnant while taking this medicine or for 6 months after stopping it. Women should inform their doctor if they wish to become pregnant or think they might be pregnant. Men should not father a child while taking this medicine and for 3 months after stopping it. There is a potential for serious side effects to an unborn child. Talk to your health care professional or pharmacist for more information. Do not breast-feed an infant while takingthis medicine or   for at least 1 week after stopping it. Men should inform their doctors if they wish to father a child. This medicine may lower sperm  counts. Talk with your doctor or health care professional ifyou are concerned about your fertility. What side effects may I notice from receiving this medication? Side effects that you should report to your doctor or health care professionalas soon as possible: allergic reactions like skin rash, itching or hives, swelling of the face, lips, or tongue breathing problems pain, redness, or irritation at site where injected signs and symptoms of a dangerous change in heartbeat or heart rhythm like chest pain; dizziness; fast or irregular heartbeat; palpitations; feeling faint or lightheaded, falls; breathing problems signs of decreased platelets or bleeding - bruising, pinpoint red spots on the skin, black, tarry stools, blood in the urine signs of decreased red blood cells - unusually weak or tired, feeling faint or lightheaded, falls signs of infection - fever or chills, cough, sore throat, pain or difficulty passing urine signs and symptoms of kidney injury like trouble passing urine or change in the amount of urine signs and symptoms of liver injury like dark yellow or brown urine; general ill feeling or flu-like symptoms; light-colored stools; loss of appetite; nausea; right upper belly pain; unusually weak or tired; yellowing of the eyes or skin swelling of ankles, feet, hands Side effects that usually do not require medical attention (report to yourdoctor or health care professional if they continue or are bothersome): constipation diarrhea hair loss loss of appetite nausea rash vomiting This list may not describe all possible side effects. Call your doctor for medical advice about side effects. You may report side effects to FDA at1-800-FDA-1088. Where should I keep my medication? This drug is given in a hospital or clinic and will not be stored at home. NOTE: This sheet is a summary. It may not cover all possible information. If you have questions about this medicine, talk to your  doctor, pharmacist, orhealth care provider.  2022 Elsevier/Gold Standard (2017-11-07 18:06:11)  

## 2021-02-08 ENCOUNTER — Encounter: Payer: Self-pay | Admitting: *Deleted

## 2021-02-08 ENCOUNTER — Ambulatory Visit (HOSPITAL_COMMUNITY): Payer: Medicare PPO

## 2021-02-08 ENCOUNTER — Other Ambulatory Visit: Payer: Self-pay | Admitting: *Deleted

## 2021-02-08 MED ORDER — DRONABINOL 2.5 MG PO CAPS: 2.5000 mg | ORAL_CAPSULE | Freq: Two times a day (BID) | ORAL | 0 refills | Status: DC

## 2021-02-08 NOTE — Progress Notes (Signed)
Received a call from patient's daughter, [name]. She requests a refill of Marinol. Request sent to Dr Marin Olp.  She also asks about treatment plan for her mother. She's like to know how much more chemo treatments her mom will receive.   Reviewed question with Dr Marin Olp. He states that as long as patient tolerate treatment, and interval scans show improvement or control, patient will continue to receive treatment indefinitely. When scans show progression, or if patient develops toxicity to treatment, then current treatment will be discontinued and he will discuss with patient and family next possible options.   This response is given to [name]. She was appreciative of the information.   Oncology Nurse Navigator Documentation  Oncology Nurse Navigator Flowsheets 02/08/2021  Abnormal Finding Date -  Planned Course of Treatment -  Phase of Treatment -  Chemotherapy Actual Start Date: -  Navigator Follow Up Date: 02/17/2021  Navigator Follow Up Reason: Follow-up Appointment;Chemotherapy  Navigator Location [facility]  Referral Date to RadOnc/MedOnc -  Navigator Encounter Type Telephone  Telephone Medication Assistance;Education;Incoming Call  Treatment Initiated Date -  Patient Visit Type MedOnc  Treatment Phase Active Tx  Barriers/Navigation Needs Coordination of Care;Education  Education Other  Interventions Education;Medication Assistance;Psycho-Social Support  Acuity Level 2-Minimal Needs (1-2 Barriers Identified)  Referrals -  Coordination of Care Other  Education Method Verbal  Support Groups/Services Friends and Family  Time Spent with Patient 1

## 2021-02-12 ENCOUNTER — Ambulatory Visit (HOSPITAL_COMMUNITY)
Admission: RE | Admit: 2021-02-12 | Discharge: 2021-02-12 | Disposition: A | Discharge status: Home / Self Care | Payer: Medicare PPO | Source: Ambulatory Visit | Attending: Hematology & Oncology | Admitting: Hematology & Oncology

## 2021-02-12 DIAGNOSIS — C23 Malignant neoplasm of gallbladder: Secondary | ICD-10-CM | POA: Insufficient documentation

## 2021-02-12 IMAGING — MR MR ABDOMEN WO/W CM
18 series · 48 of 48 positions shown · IV contrast (gadavist)
Comparison: Abdominal MRI [DATE].

CLINICAL DATA: 80-year-old female with history of hepatiobiliary
cancer on chemotherapy. Follow-up study.

EXAM:
MRI ABDOMEN WITHOUT AND WITH CONTRAST
TECHNIQUE: Multiplanar multisequence MR imaging of the abdomen was performed
both before and after the administration of intravenous contrast.
CONTRAST:  7mL GADAVIST GADOBUTROL 1 MMOL/ML IV SOLN

[Series 3: T2 · coronal · 6.0mm · 1.56mm/px · 2 of 30 slices shown (1 of 2)]
[im 1/30]
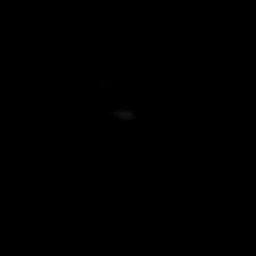
[im 30/30]
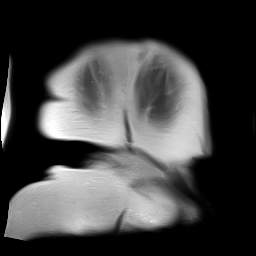

[Series 4: T2 fat-sat · axial · 6.0mm · 1.25mm/px · z∈[-54,+198]mm · 2 of 36 slices shown]
[im 1/36]
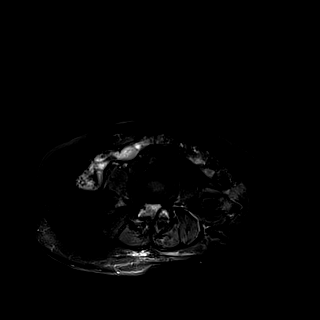
[im 36/36]
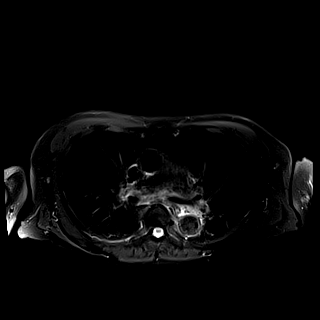

[Series 6: T1 · axial · 3.0mm · 1.31mm/px · z∈[-27,+186]mm · 4 of 72 slices shown (1 of 2)]
[im 1/72]
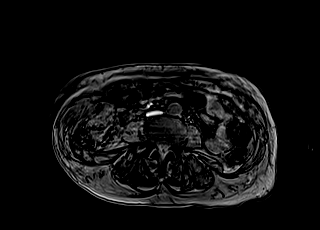
[im 24/72]
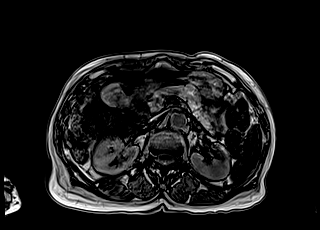
[im 48/72]
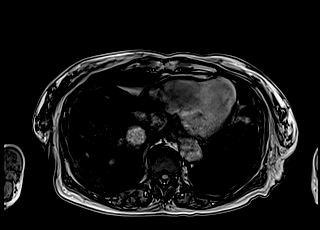
[im 72/72]
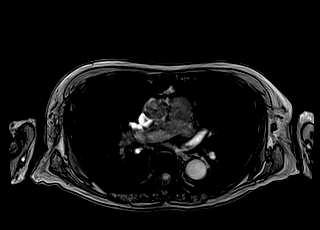

[Series 7: T1 · axial · 3.0mm · 1.31mm/px · z∈[-27,+186]mm · 4 of 72 slices shown (2 of 2)]
[im 1/72]
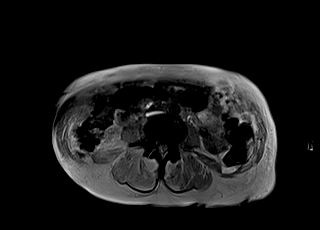
[im 24/72]
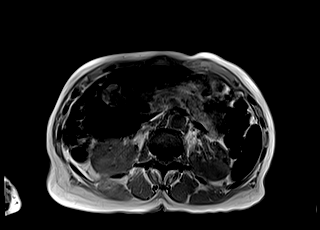
[im 48/72]
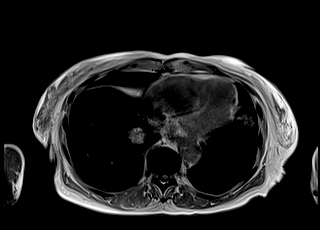
[im 72/72]
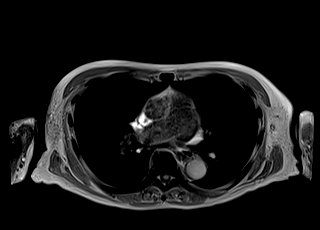

[Series 8: DWI · axial · 6.0mm · 1.49mm/px · z∈[-66,+186]mm · 3 of 72 slices shown (1 of 2)]
[im 1/72]
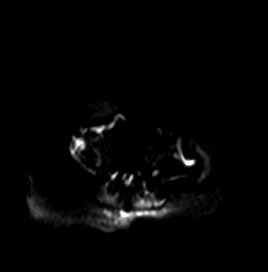
[im 36/72]
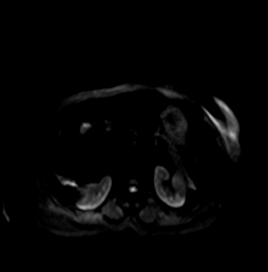
[im 72/72]
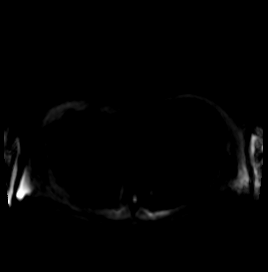

[Series 9: DWI · axial · 6.0mm · 1.49mm/px · 1 of 36 slices shown (2 of 2)]
[im 1/36]
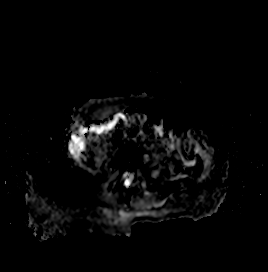

[Series 10: bSSFP · axial · 4.0mm · 0.84mm/px · z∈[-29,+187]mm · 2 of 55 slices shown]
[im 1/55]
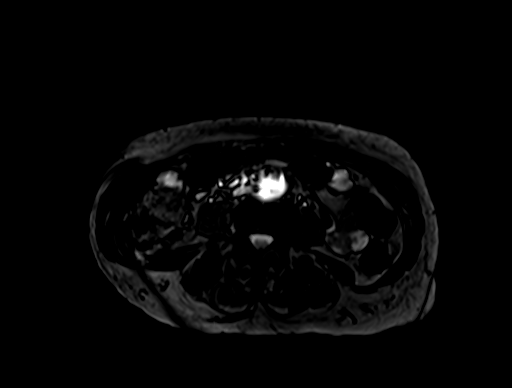
[im 55/55]
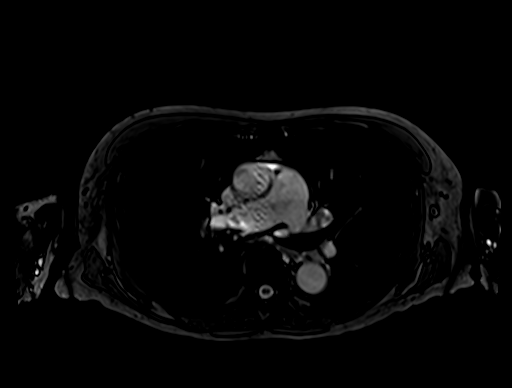

[Series 12: T1 dynamic · axial · 3.0mm · 1.25mm/px · z∈[-39,+198]mm · 3 of 80 slices shown (1 of 10)]
[im 1/80]
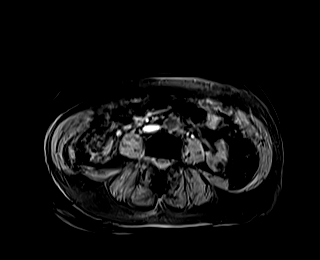
[im 40/80]
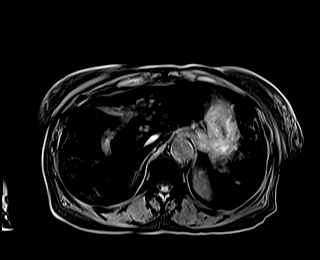
[im 80/80]
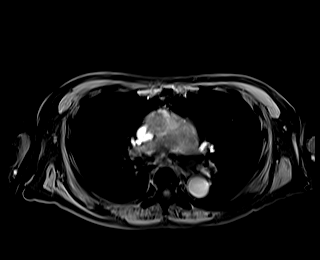

[Series 16: T1 dynamic · axial · 3.0mm · 1.25mm/px · z∈[-39,+198]mm · 3 of 80 slices shown (2 of 10)]
[im 1/80]
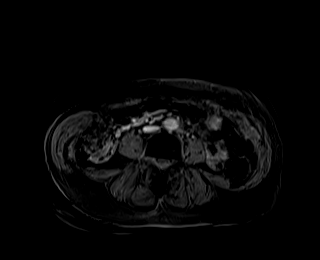
[im 40/80]
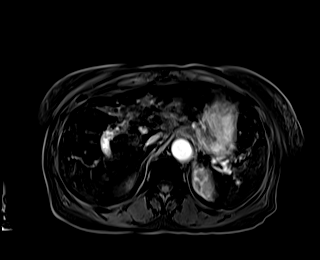
[im 80/80]
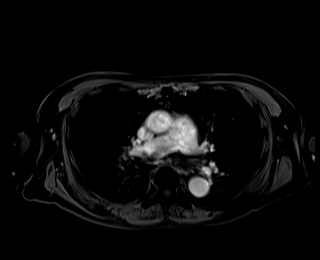

[Series 17: T1 dynamic · axial · 3.0mm · 1.25mm/px · z∈[-39,+198]mm · 3 of 80 slices shown (3 of 10)]
[im 1/80]
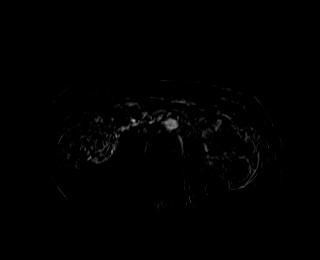
[im 40/80]
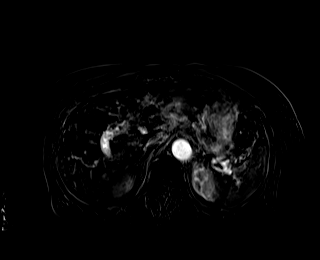
[im 80/80]
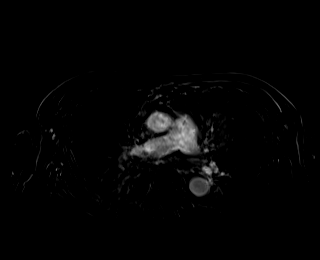

[Series 20: T1 dynamic · axial · 3.0mm · 1.25mm/px · z∈[-39,+198]mm · 3 of 80 slices shown (4 of 10)]
[im 1/80]
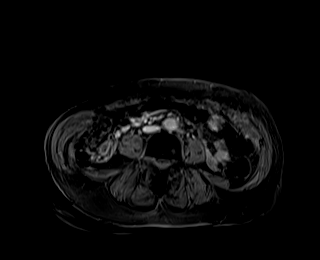
[im 40/80]
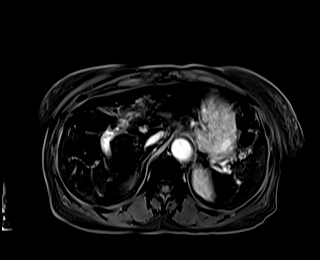
[im 80/80]
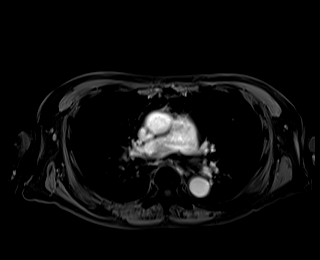

[Series 21: T1 dynamic · axial · 3.0mm · 1.25mm/px · z∈[-39,+198]mm · 3 of 80 slices shown (5 of 10)]
[im 1/80]
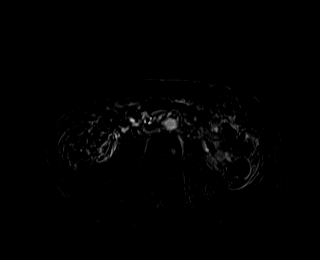
[im 40/80]
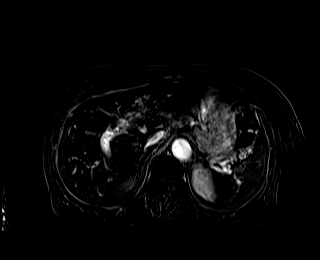
[im 80/80]
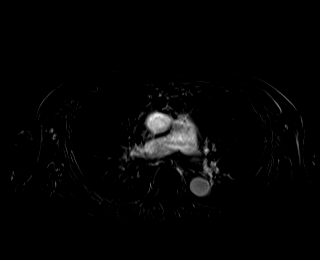

[Series 24: T1 dynamic · axial · 3.0mm · 1.25mm/px · z∈[-39,+198]mm · 3 of 80 slices shown (6 of 10)]
[im 1/80]
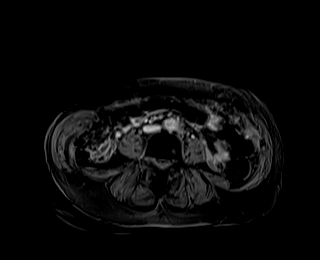
[im 40/80]
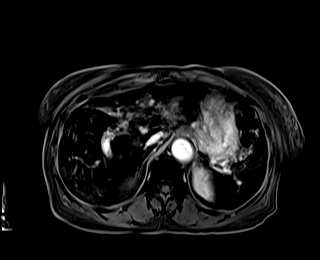
[im 80/80]
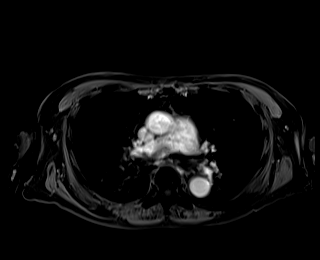

[Series 25: T1 dynamic · axial · 3.0mm · 1.25mm/px · z∈[-39,+198]mm · 3 of 80 slices shown (7 of 10)]
[im 1/80]
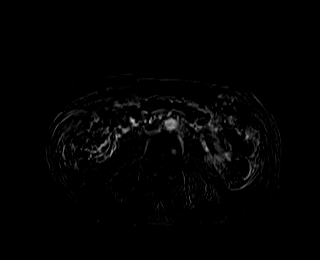
[im 40/80]
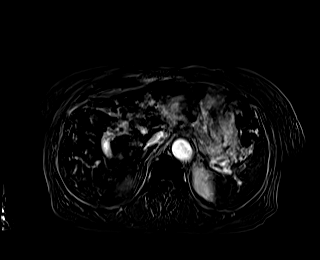
[im 80/80]
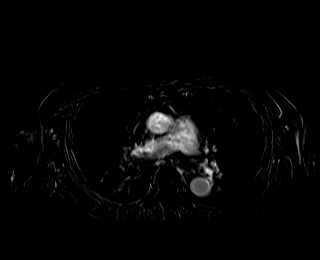

[Series 27: T1 dynamic · coronal · 5.0mm · 1.41mm/px · 2 of 44 slices shown (8 of 10)]
[im 1/44]
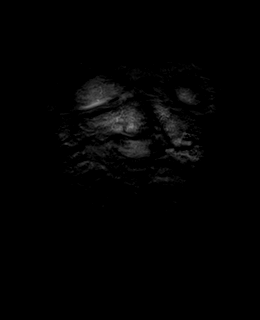
[im 44/44]
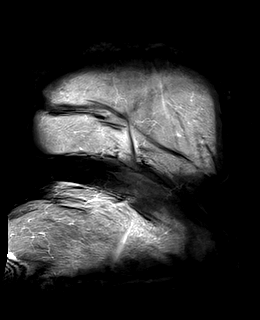

[Series 28: T2 · axial · 6.0mm · 1.64mm/px · 1 of 34 slices shown (2 of 2)]
[im 1/34]
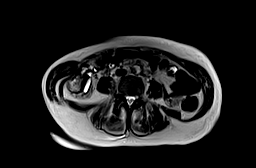

[Series 31: T1 dynamic · axial · 3.0mm · 1.25mm/px · z∈[-39,+198]mm · 3 of 80 slices shown (9 of 10)]
[im 1/80]
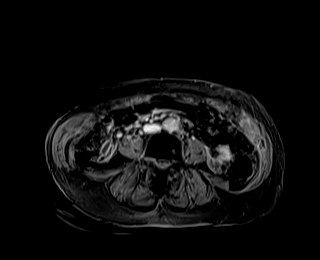
[im 40/80]
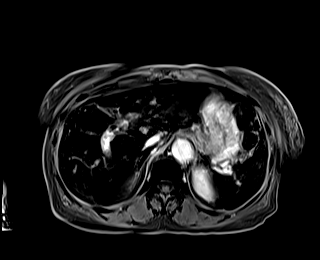
[im 80/80]
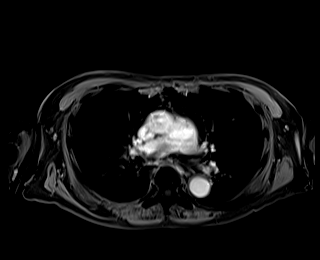

[Series 32: T1 dynamic · axial · 3.0mm · 1.25mm/px · z∈[-39,+198]mm · 3 of 80 slices shown (10 of 10)]
[im 1/80]
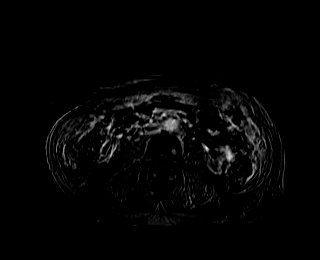
[im 40/80]
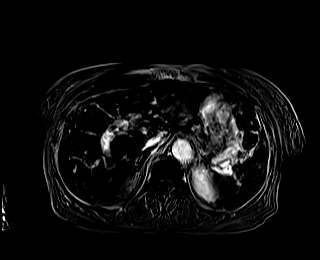
[im 80/80]
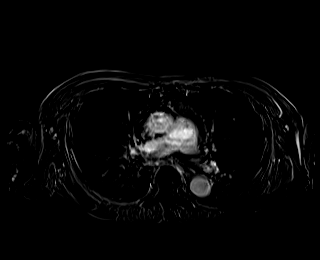

[48 of 48 positions shown; findings below may reference images not displayed]

FINDINGS: Lower chest: Unremarkable.

Hepatobiliary: Severe diffuse low signal intensity throughout the
hepatic parenchyma on T1 and T2 weighted images, indicative of
severe iron deposition. Several hepatic lesions are again noted,
although these are decreased in number and size compared to the
prior examination indicative of a positive response to therapy.
Specifically, the heterogeneously enhancing lesion in the
gallbladder fossa currently measures 4.2 x 3.1 cm (axial image 52 of
series 20) and previously measured 6.9 x 5.3 cm on [DATE].
Another large lesion in the central aspect of segment 7 of the liver
(axial image 31 of series 20) has also decreased, currently
measuring 3.3 x 3.0 cm (previously 6.6 x 5.4 cm). Lesion in the
central aspect of the liver predominantly in segment 4B (axial image
36 of series 20) measuring 2.2 x 1.6 cm (previously 4.9 x 4.3 cm).
Another lesion in the superior aspect of the right lobe of the liver
between segments 7 and 8 which previously demonstrated enhancement
no longer enhances, and is smaller on pre gadolinium T2 weighted
images (axial image 9 of series 4) currently measuring only 1.3 x
0.9 cm (previously 4.1 x 3.4 cm). No new hepatic lesions are noted.
Moderate intrahepatic biliary ductal dilatation noted throughout the
left lobe of the liver (none in the right) and moderate extrahepatic
biliary ductal dilatation, with the common bile duct measuring
cm proximally and 8 mm distally. No filling defect within the common
bile duct to suggest choledocholithiasis.

Pancreas: No pancreatic mass. No pancreatic ductal dilatation. No
pancreatic or peripancreatic fluid collections or inflammatory
changes.

Spleen: Diffuse low signal intensity throughout the splenic
parenchyma on pre gadolinium pulse sequences indicative of severe
iron deposition.

Adrenals/Urinary Tract: Bilateral kidneys and adrenal glands are
normal in appearance. No hydroureteronephrosis in the visualized
portions of the abdomen.

Stomach/Bowel: Visualized portions are unremarkable.

Vascular/Lymphatic: No aneurysm identified in the visualized
abdominal vasculature. No lymphadenopathy noted in the abdomen.

Other: No significant volume of ascites in the visualized portions
of the peritoneal cavity.

Musculoskeletal: No aggressive appearing osseous lesions are noted
in the visualized portions of the skeleton.
IMPRESSION: 1. Today's study demonstrates positive response to therapy with
regression of both the lesion in the gallbladder fossa, as well as
the other metastatic lesions scattered throughout the hepatic
parenchyma, as detailed above.
2. Persistent moderate common bile duct dilatation and left-sided
intrahepatic biliary ductal dilatation, similar to the prior
examination. No choledocholithiasis.
3. Severe iron deposition in the liver and spleen again noted.

## 2021-02-12 MED ORDER — GADOBUTROL 1 MMOL/ML IV SOLN
7.0000 mL | Freq: Once | INTRAVENOUS | Status: AC | PRN
  Administered 2021-02-12: 7 mL via INTRAVENOUS

## 2021-02-14 ENCOUNTER — Encounter: Payer: Self-pay | Admitting: *Deleted

## 2021-02-14 NOTE — Progress Notes (Signed)
Oncology Nurse Navigator Documentation  Oncology Nurse Navigator Flowsheets 02/14/2021  Abnormal Finding Date -  Planned Course of Treatment -  Phase of Treatment -  Chemotherapy Actual Start Date: -  Navigator Follow Up Date: 02/17/2021  Navigator Follow Up Reason: Follow-up Appointment;Chemotherapy  Navigator Location [facility]  Referral Date to RadOnc/MedOnc -  Navigator Encounter Type Scan Review  Telephone -  Treatment Initiated Date -  Patient Visit Type MedOnc  Treatment Phase Active Tx  Barriers/Navigation Needs Coordination of Care;Education  Education -  Interventions None Required  Acuity Level 2-Minimal Needs (1-2 Barriers Identified)  Referrals -  Coordination of Care -  Education Method -  Support Groups/Services Friends and Family  Time Spent with Patient 15

## 2021-02-15 ENCOUNTER — Encounter: Payer: Self-pay | Admitting: *Deleted

## 2021-02-15 ENCOUNTER — Other Ambulatory Visit: Payer: Self-pay | Admitting: *Deleted

## 2021-02-15 DIAGNOSIS — D631 Anemia in chronic kidney disease: Secondary | ICD-10-CM

## 2021-02-15 DIAGNOSIS — Z789 Other specified health status: Secondary | ICD-10-CM

## 2021-02-15 DIAGNOSIS — D500 Iron deficiency anemia secondary to blood loss (chronic): Secondary | ICD-10-CM

## 2021-02-15 DIAGNOSIS — IMO0001 Reserved for inherently not codable concepts without codable children: Secondary | ICD-10-CM

## 2021-02-15 DIAGNOSIS — C23 Malignant neoplasm of gallbladder: Secondary | ICD-10-CM

## 2021-02-15 MED ORDER — RETACRIT 10000 UNIT/ML IJ SOLN: 10000.0000 [IU] | INTRAMUSCULAR | 3 refills | Status: DC

## 2021-02-15 NOTE — Progress Notes (Signed)
Received a call from patient's daughter, [name]. She requests a refill for the Retacrit be sent to pharmacy. Refill of medication sent, as well as additional refills. Notified [name] of new prescription and additional refills. Recommended that she reach out to [facility] about a week prior to need for next fill.  Oncology Nurse Navigator Documentation  Oncology Nurse Navigator Flowsheets 02/15/2021  Abnormal Finding Date -  Planned Course of Treatment -  Phase of Treatment -  Chemotherapy Actual Start Date: -  Navigator Follow Up Date: 02/17/2021  Navigator Follow Up Reason: Follow-up Appointment  Navigator Location [facility]  Referral Date to RadOnc/MedOnc -  Navigator Encounter Type Telephone  Telephone Medication Assistance;Incoming Call  Treatment Initiated Date -  Patient Visit Type MedOnc  Treatment Phase Active Tx  Barriers/Navigation Needs Coordination of Care;Education  Education Other  Interventions Medication Assistance;Education  Acuity Level 2-Minimal Needs (1-2 Barriers Identified)  Referrals -  Coordination of Care -  Education Method Verbal  Support Groups/Services Friends and Family  Time Spent with Patient 41

## 2021-02-16 ENCOUNTER — Encounter (HOSPITAL_BASED_OUTPATIENT_CLINIC_OR_DEPARTMENT_OTHER): Payer: Self-pay

## 2021-02-16 ENCOUNTER — Emergency Department (HOSPITAL_BASED_OUTPATIENT_CLINIC_OR_DEPARTMENT_OTHER): Payer: Medicare PPO

## 2021-02-16 ENCOUNTER — Inpatient Hospital Stay (HOSPITAL_BASED_OUTPATIENT_CLINIC_OR_DEPARTMENT_OTHER)
Admission: EM | Admit: 2021-02-16 | Discharge: 2021-02-21 | DRG: 808 | Disposition: A | Discharge status: Home / Self Care | Payer: Medicare PPO | Attending: Family Medicine | Admitting: Family Medicine

## 2021-02-16 ENCOUNTER — Telehealth: Payer: Self-pay

## 2021-02-16 ENCOUNTER — Other Ambulatory Visit: Payer: Self-pay

## 2021-02-16 ENCOUNTER — Encounter: Payer: Self-pay | Admitting: *Deleted

## 2021-02-16 DIAGNOSIS — Z87891 Personal history of nicotine dependence: Secondary | ICD-10-CM | POA: Diagnosis not present

## 2021-02-16 DIAGNOSIS — D61810 Antineoplastic chemotherapy induced pancytopenia: Secondary | ICD-10-CM | POA: Diagnosis present

## 2021-02-16 DIAGNOSIS — Z79899 Other long term (current) drug therapy: Secondary | ICD-10-CM | POA: Diagnosis not present

## 2021-02-16 DIAGNOSIS — E876 Hypokalemia: Secondary | ICD-10-CM | POA: Diagnosis present

## 2021-02-16 DIAGNOSIS — I10 Essential (primary) hypertension: Secondary | ICD-10-CM | POA: Diagnosis present

## 2021-02-16 DIAGNOSIS — Z20822 Contact with and (suspected) exposure to covid-19: Secondary | ICD-10-CM | POA: Diagnosis present

## 2021-02-16 DIAGNOSIS — C779 Secondary and unspecified malignant neoplasm of lymph node, unspecified: Secondary | ICD-10-CM | POA: Diagnosis present

## 2021-02-16 DIAGNOSIS — D631 Anemia in chronic kidney disease: Secondary | ICD-10-CM | POA: Diagnosis present

## 2021-02-16 DIAGNOSIS — IMO0001 Reserved for inherently not codable concepts without codable children: Secondary | ICD-10-CM

## 2021-02-16 DIAGNOSIS — F05 Delirium due to known physiological condition: Secondary | ICD-10-CM | POA: Diagnosis present

## 2021-02-16 DIAGNOSIS — C23 Malignant neoplasm of gallbladder: Secondary | ICD-10-CM

## 2021-02-16 DIAGNOSIS — Z6826 Body mass index (BMI) 26.0-26.9, adult: Secondary | ICD-10-CM | POA: Diagnosis not present

## 2021-02-16 DIAGNOSIS — N184 Chronic kidney disease, stage 4 (severe): Secondary | ICD-10-CM | POA: Diagnosis present

## 2021-02-16 DIAGNOSIS — R531 Weakness: Secondary | ICD-10-CM

## 2021-02-16 DIAGNOSIS — T451X5A Adverse effect of antineoplastic and immunosuppressive drugs, initial encounter: Secondary | ICD-10-CM | POA: Diagnosis present

## 2021-02-16 DIAGNOSIS — E440 Moderate protein-calorie malnutrition: Secondary | ICD-10-CM | POA: Diagnosis present

## 2021-02-16 DIAGNOSIS — E871 Hypo-osmolality and hyponatremia: Secondary | ICD-10-CM | POA: Diagnosis present

## 2021-02-16 DIAGNOSIS — G9341 Metabolic encephalopathy: Secondary | ICD-10-CM | POA: Diagnosis present

## 2021-02-16 DIAGNOSIS — D61818 Other pancytopenia: Principal | ICD-10-CM | POA: Diagnosis present

## 2021-02-16 DIAGNOSIS — D649 Anemia, unspecified: Secondary | ICD-10-CM

## 2021-02-16 DIAGNOSIS — Y929 Unspecified place or not applicable: Secondary | ICD-10-CM

## 2021-02-16 DIAGNOSIS — Z789 Other specified health status: Secondary | ICD-10-CM

## 2021-02-16 DIAGNOSIS — E785 Hyperlipidemia, unspecified: Secondary | ICD-10-CM | POA: Diagnosis present

## 2021-02-16 DIAGNOSIS — I129 Hypertensive chronic kidney disease with stage 1 through stage 4 chronic kidney disease, or unspecified chronic kidney disease: Secondary | ICD-10-CM | POA: Diagnosis present

## 2021-02-16 DIAGNOSIS — Z531 Procedure and treatment not carried out because of patient's decision for reasons of belief and group pressure: Secondary | ICD-10-CM | POA: Diagnosis present

## 2021-02-16 DIAGNOSIS — D500 Iron deficiency anemia secondary to blood loss (chronic): Secondary | ICD-10-CM | POA: Diagnosis present

## 2021-02-16 DIAGNOSIS — C787 Secondary malignant neoplasm of liver and intrahepatic bile duct: Secondary | ICD-10-CM | POA: Diagnosis present

## 2021-02-16 DIAGNOSIS — D709 Neutropenia, unspecified: Secondary | ICD-10-CM

## 2021-02-16 DIAGNOSIS — D696 Thrombocytopenia, unspecified: Secondary | ICD-10-CM

## 2021-02-16 LAB — URINALYSIS, ROUTINE W REFLEX MICROSCOPIC
Bilirubin Urine: NEGATIVE
Glucose, UA: NEGATIVE mg/dL
Ketones, ur: NEGATIVE mg/dL
Leukocytes,Ua: NEGATIVE
Nitrite: NEGATIVE
Protein, ur: NEGATIVE mg/dL
Specific Gravity, Urine: 1.02 (ref 1.005–1.030)
pH: 5.5 (ref 5.0–8.0)

## 2021-02-16 LAB — CBC WITH DIFFERENTIAL/PLATELET
Abs Immature Granulocytes: 0.06 10*3/uL (ref 0.00–0.07)
Basophils Absolute: 0 10*3/uL (ref 0.0–0.1)
Basophils Relative: 0 %
Eosinophils Absolute: 0 10*3/uL (ref 0.0–0.5)
Eosinophils Relative: 4 %
HCT: 18.4 % — ABNORMAL LOW (ref 36.0–46.0)
Hemoglobin: 6.1 g/dL — CL (ref 12.0–15.0)
Immature Granulocytes: 6 %
Lymphocytes Relative: 18 %
Lymphs Abs: 0.2 10*3/uL — ABNORMAL LOW (ref 0.7–4.0)
MCH: 30.8 pg (ref 26.0–34.0)
MCHC: 33.2 g/dL (ref 30.0–36.0)
MCV: 92.9 fL (ref 80.0–100.0)
Monocytes Absolute: 0.2 10*3/uL (ref 0.1–1.0)
Monocytes Relative: 19 %
Neutro Abs: 0.5 10*3/uL — ABNORMAL LOW (ref 1.7–7.7)
Neutrophils Relative %: 53 %
Platelets: 29 10*3/uL — CL (ref 150–400)
RBC: 1.98 MIL/uL — ABNORMAL LOW (ref 3.87–5.11)
RDW: 19 % — ABNORMAL HIGH (ref 11.5–15.5)
WBC: 1 10*3/uL — CL (ref 4.0–10.5)
nRBC: 0 % (ref 0.0–0.2)

## 2021-02-16 LAB — COMPREHENSIVE METABOLIC PANEL
ALT: 18 U/L (ref 0–44)
AST: 19 U/L (ref 15–41)
Albumin: 3.5 g/dL (ref 3.5–5.0)
Alkaline Phosphatase: 104 U/L (ref 38–126)
Anion gap: 9 (ref 5–15)
BUN: 28 mg/dL — ABNORMAL HIGH (ref 8–23)
CO2: 23 mmol/L (ref 22–32)
Calcium: 8.5 mg/dL — ABNORMAL LOW (ref 8.9–10.3)
Chloride: 97 mmol/L — ABNORMAL LOW (ref 98–111)
Creatinine, Ser: 2.14 mg/dL — ABNORMAL HIGH (ref 0.44–1.00)
GFR, Estimated: 23 mL/min — ABNORMAL LOW (ref >60)
Glucose, Bld: 126 mg/dL — ABNORMAL HIGH (ref 70–99)
Potassium: 3.6 mmol/L (ref 3.5–5.1)
Sodium: 129 mmol/L — ABNORMAL LOW (ref 135–145)
Total Bilirubin: 0.4 mg/dL (ref 0.3–1.2)
Total Protein: 6.4 g/dL — ABNORMAL LOW (ref 6.5–8.1)

## 2021-02-16 LAB — URINALYSIS, MICROSCOPIC (REFLEX): WBC, UA: NONE SEEN WBC/hpf (ref 0–5)

## 2021-02-16 LAB — TROPONIN I (HIGH SENSITIVITY)
Troponin I (High Sensitivity): 16 ng/L (ref <18)
Troponin I (High Sensitivity): 16 ng/L (ref <18)

## 2021-02-16 LAB — RESP PANEL BY RT-PCR (FLU A&B, COVID) ARPGX2
Influenza A by PCR: NEGATIVE
Influenza B by PCR: NEGATIVE
SARS Coronavirus 2 by RT PCR: NEGATIVE

## 2021-02-16 LAB — AMMONIA: Ammonia: 13 umol/L (ref 9–35)

## 2021-02-16 LAB — OCCULT BLOOD X 1 CARD TO LAB, STOOL: Fecal Occult Bld: NEGATIVE

## 2021-02-16 LAB — MRSA PCR SCREENING: MRSA by PCR: NEGATIVE

## 2021-02-16 LAB — LIPASE, BLOOD: Lipase: 21 U/L (ref 11–51)

## 2021-02-16 IMAGING — DX DG CHEST 1V PORT
1 series · 1 of 1 positions shown · non-contrast
Comparison: No prior.

CLINICAL DATA: Weakness.

EXAM:
PORTABLE CHEST 1 VIEW

[chest ap]
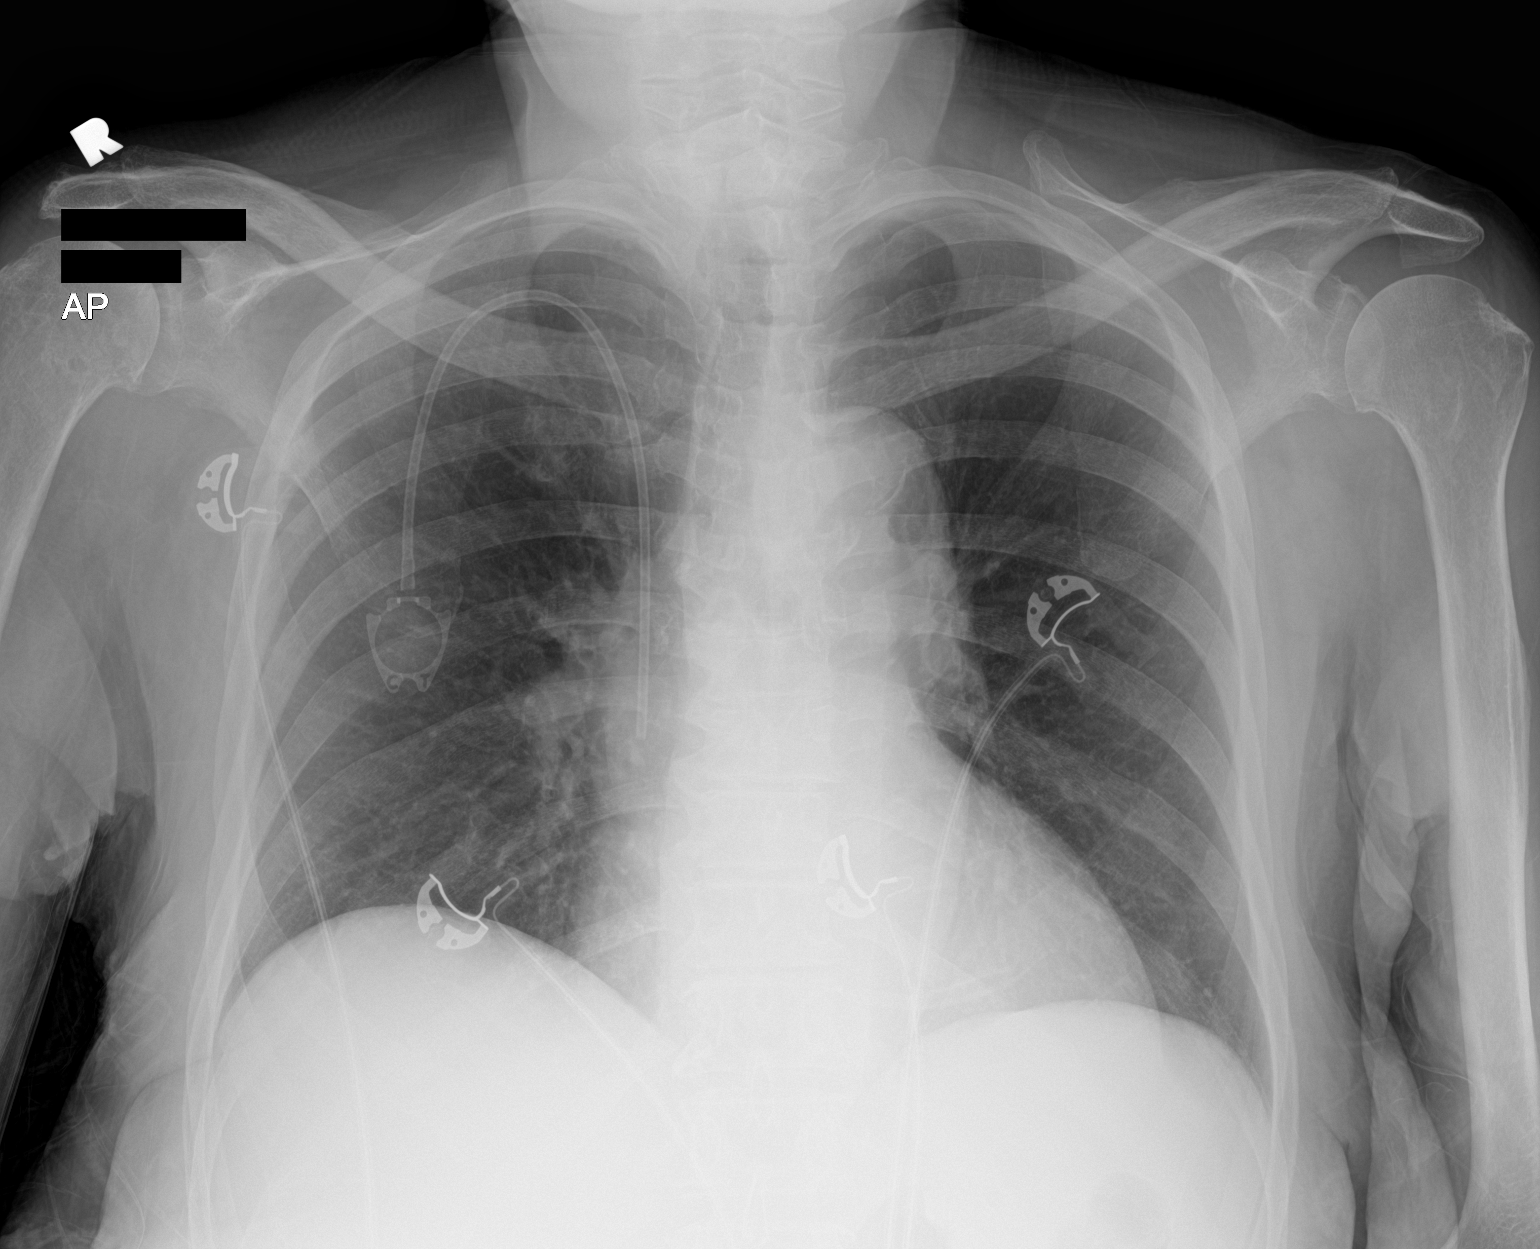

[1 of 1 positions shown; findings below may reference images not displayed]

FINDINGS: PowerPort catheter with tip over SVC. Heart size normal. No
pulmonary venous congestion. No focal infiltrate. No pleural
effusion or pneumothorax.
IMPRESSION: PowerPort catheter noted with tip over SVC. No acute cardiopulmonary
disease.

## 2021-02-16 IMAGING — CT CT HEAD W/O CM
3 series · 16 of 47 positions shown, 19 images · non-contrast
Comparison: None.

CLINICAL DATA: Mental status change

EXAM:
CT HEAD WITHOUT CONTRAST
TECHNIQUE: Contiguous axial images were obtained from the base of the skull
through the vertex without intravenous contrast.

[Series 2: head wo · axial · 0.41mm/px · z∈[-179,-44]mm · 10 of 33 slices shown, 13 images]
[im 3/33  brain]
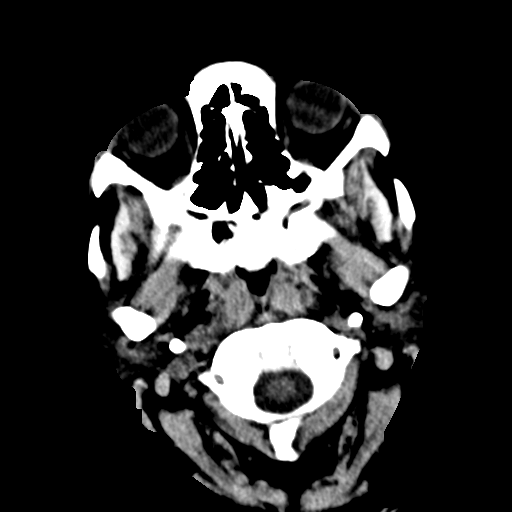
[im 3/33  bone]
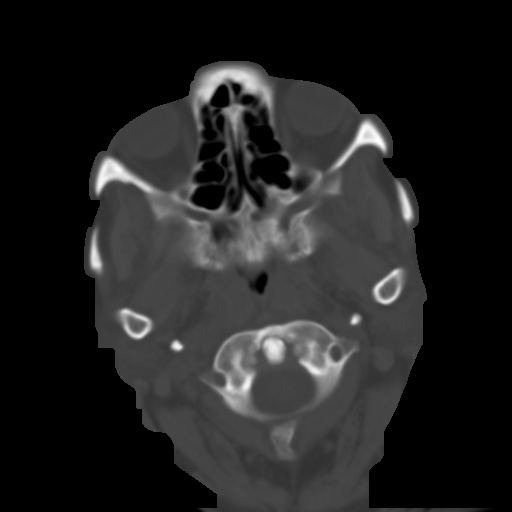
[im 6/33  brain]
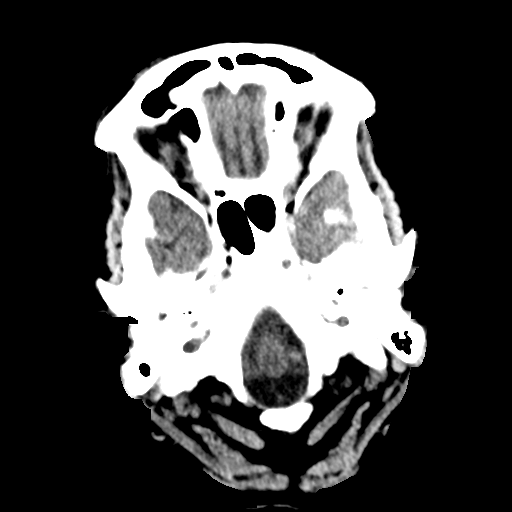
[im 9/33  brain]
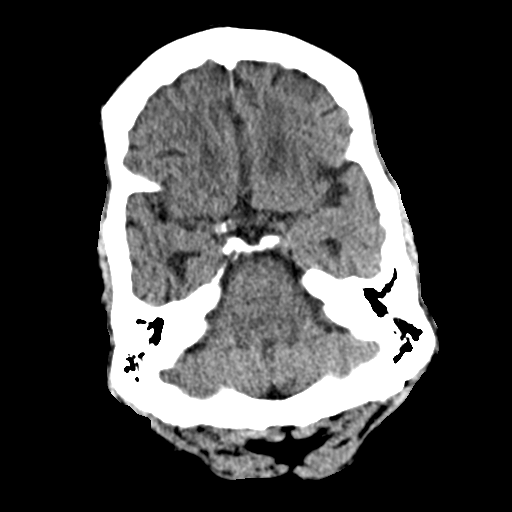
[im 12/33  brain]
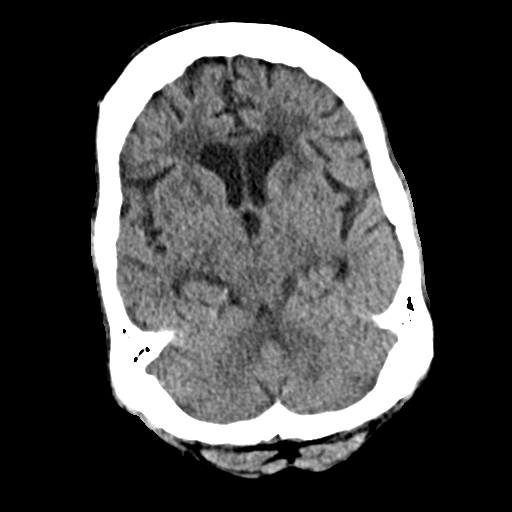
[im 15/33  brain]
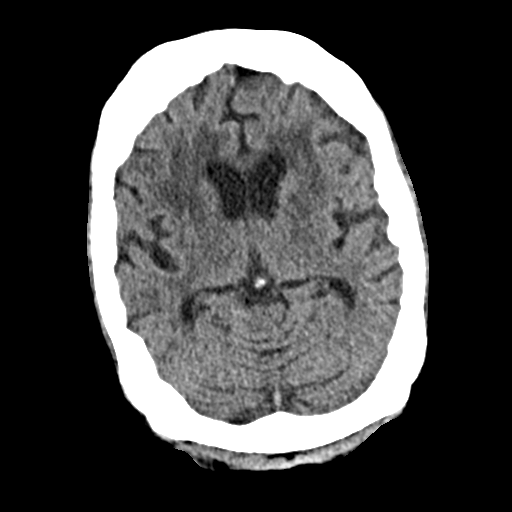
[im 15/33  bone]
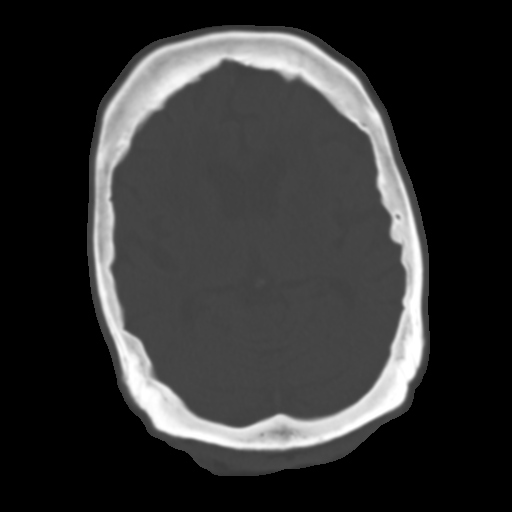
[im 18/33  brain]
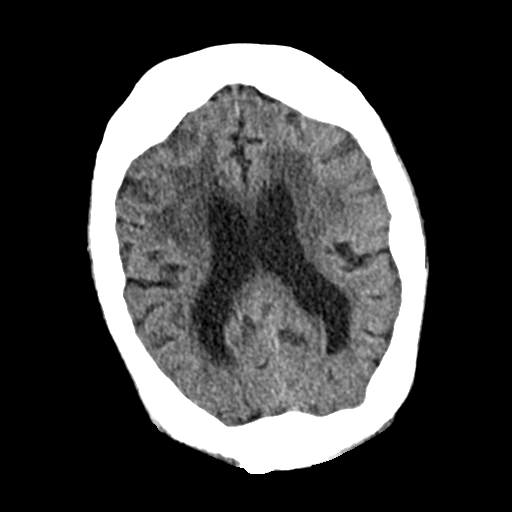
[im 21/33  brain]
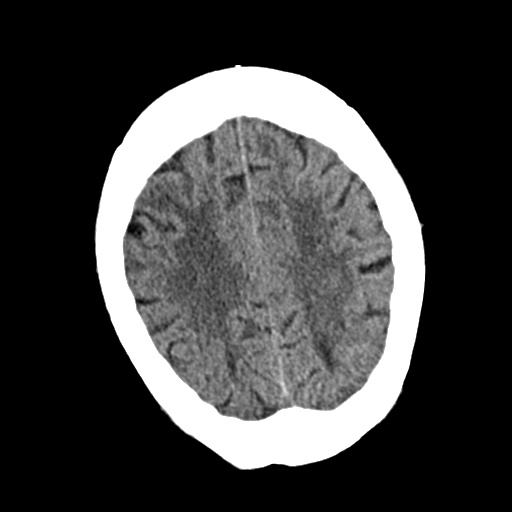
[im 25/33  brain]
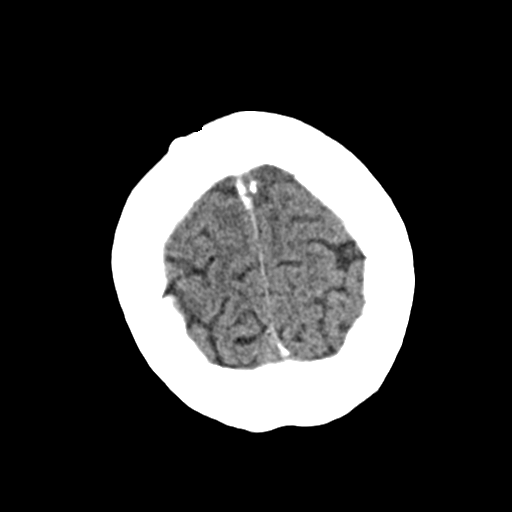
[im 27/33  brain]
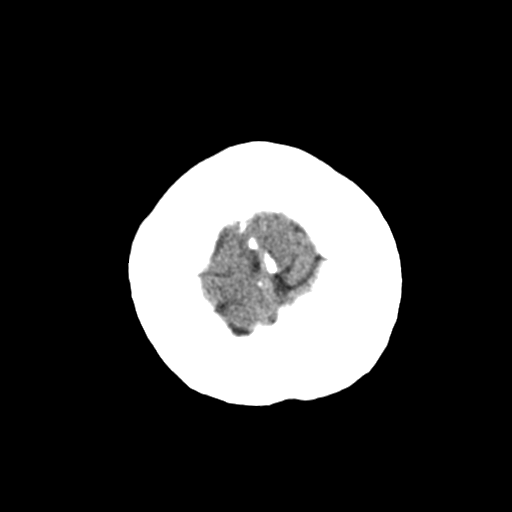
[im 27/33  bone]
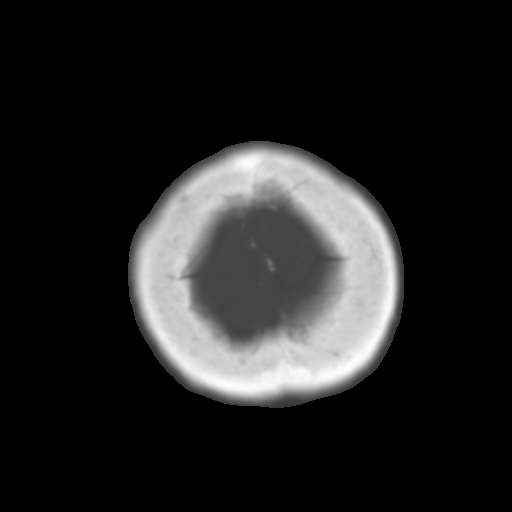
[im 30/33  brain]
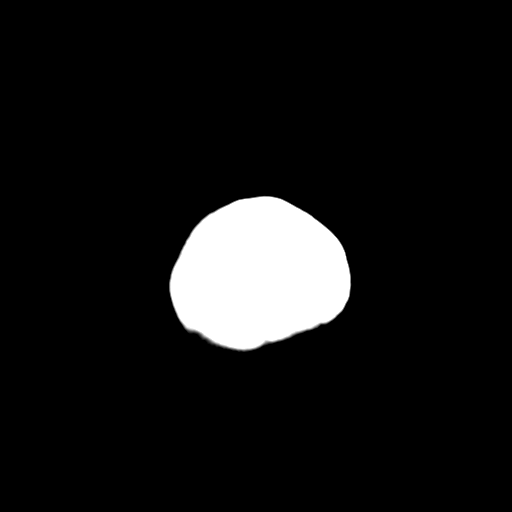

[Series 4: coronal soft · coronal · 0.33mm/px · 3 of 68 slices shown]
[im 23/68  brain]
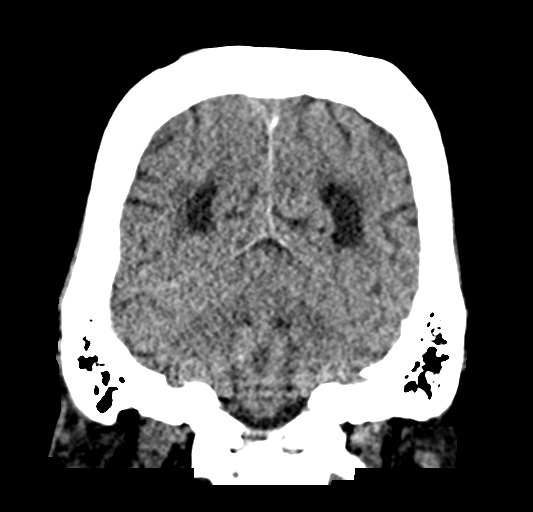
[im 30/68  brain]
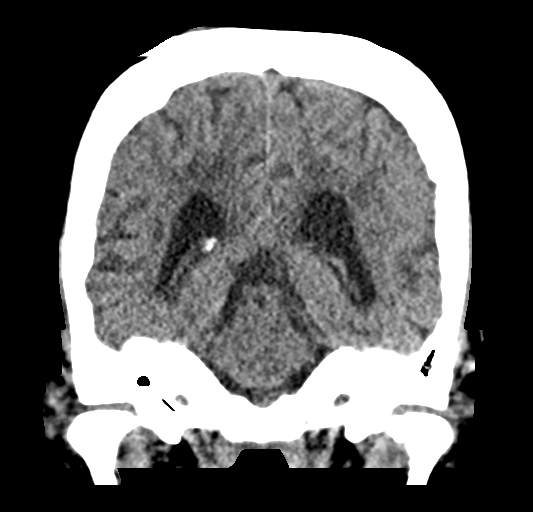
[im 38/68  brain]
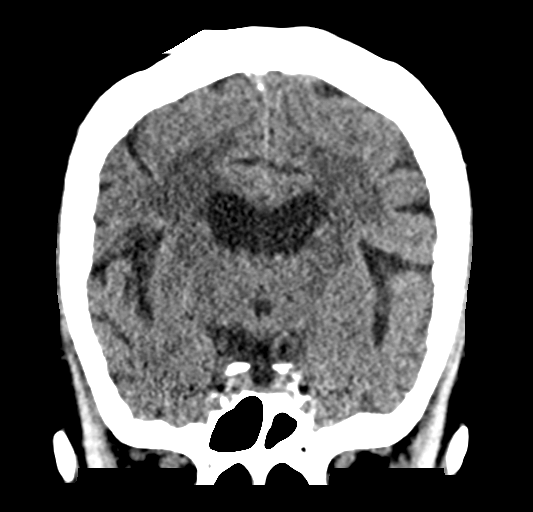

[Series 5: sag soft · sagittal · 0.33mm/px · 3 of 51 slices shown]
[im 17/51  brain]
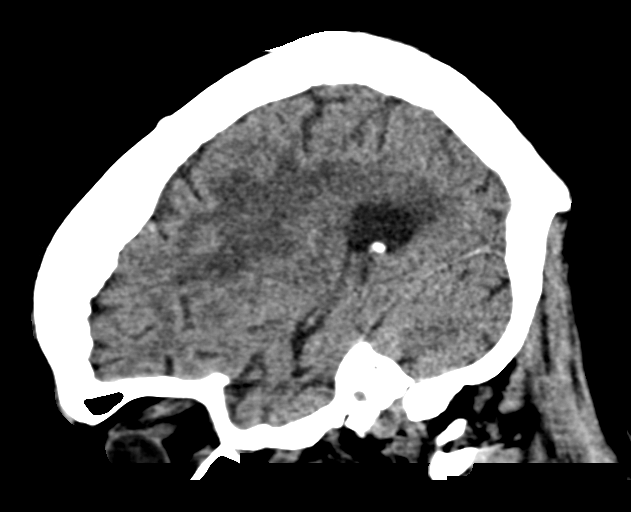
[im 26/51  brain]
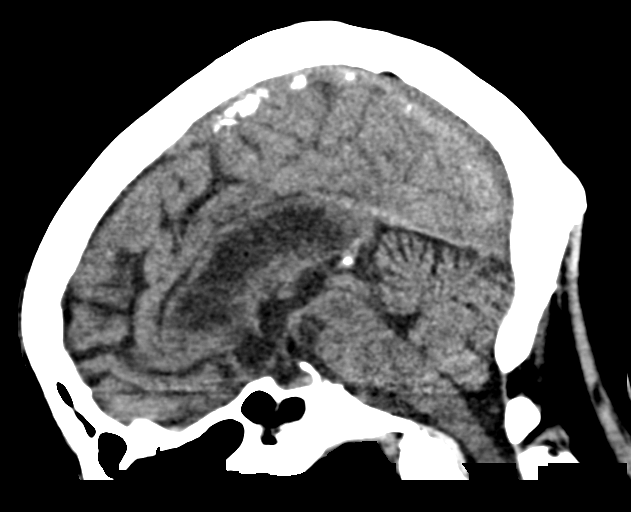
[im 34/51  brain]
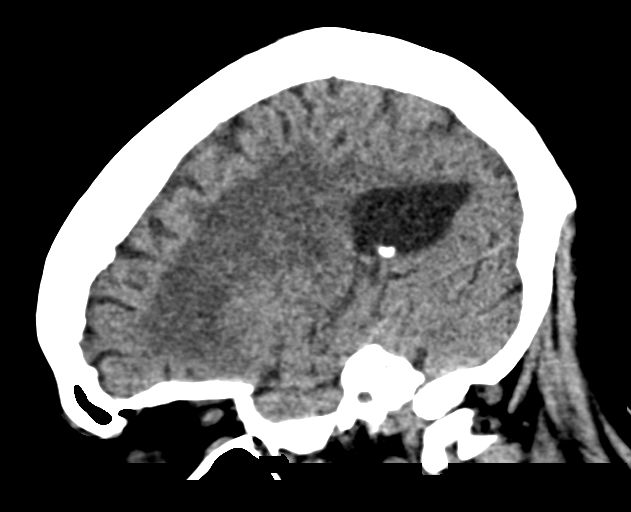

[16 of 47 positions shown; findings below may reference images not displayed]

FINDINGS: Brain: There is no acute intracranial hemorrhage, mass effect, or
edema. Gray-white differentiation is preserved. There is no
extra-axial fluid collection. Patchy and confluent areas of
low-attenuation in the supratentorial white matter are nonspecific
but may reflect moderate chronic microvascular ischemic changes.
Prominence of the ventricles and sulci reflects generalized
parenchymal volume loss.

Vascular: There is atherosclerotic calcification at the skull base.

Skull: Calvarium is unremarkable.

Sinuses/Orbits: Some frothy retained secretions in the left sphenoid
sinus. No acute abnormality of the orbits.

Other: None.
IMPRESSION: No acute intracranial abnormality. Chronic microvascular ischemic
changes.

## 2021-02-16 MED ORDER — ONDANSETRON HCL 4 MG PO TABS: 4.0000 mg | ORAL_TABLET | Freq: Four times a day (QID) | ORAL | Status: DC | PRN

## 2021-02-16 MED ORDER — SODIUM CHLORIDE 0.9 % IV SOLN
2.0000 g | INTRAVENOUS | Status: DC
  Administered 2021-02-17 – 2021-02-18 (×2): 2 g via INTRAVENOUS
  Filled 2021-02-16 (×2): qty 2

## 2021-02-16 MED ORDER — ONDANSETRON HCL 4 MG/2ML IJ SOLN: 4.0000 mg | Freq: Four times a day (QID) | INTRAMUSCULAR | Status: DC | PRN

## 2021-02-16 MED ORDER — SODIUM CHLORIDE 0.9% FLUSH
3.0000 mL | Freq: Two times a day (BID) | INTRAVENOUS | Status: DC
  Administered 2021-02-16 – 2021-02-18 (×3): 3 mL via INTRAVENOUS

## 2021-02-16 MED ORDER — SENNOSIDES-DOCUSATE SODIUM 8.6-50 MG PO TABS: 1.0000 | ORAL_TABLET | Freq: Every evening | ORAL | Status: DC | PRN

## 2021-02-16 MED ORDER — ACETAMINOPHEN 325 MG PO TABS: 650.0000 mg | ORAL_TABLET | Freq: Four times a day (QID) | ORAL | Status: DC | PRN

## 2021-02-16 MED ORDER — SODIUM CHLORIDE 0.9 % IV SOLN
2.0000 g | INTRAVENOUS | Status: AC
  Administered 2021-02-16: 2 g via INTRAVENOUS
  Filled 2021-02-16: qty 2

## 2021-02-16 MED ORDER — ACETAMINOPHEN 650 MG RE SUPP: 650.0000 mg | Freq: Four times a day (QID) | RECTAL | Status: DC | PRN

## 2021-02-16 MED ORDER — CHLORHEXIDINE GLUCONATE CLOTH 2 % EX PADS
6.0000 | MEDICATED_PAD | Freq: Every day | CUTANEOUS | Status: DC
  Administered 2021-02-17 – 2021-02-21 (×3): 6 via TOPICAL

## 2021-02-16 MED ORDER — SODIUM CHLORIDE 0.9 % IV BOLUS
500.0000 mL | Freq: Once | INTRAVENOUS | Status: AC
  Administered 2021-02-16: 500 mL via INTRAVENOUS

## 2021-02-16 MED ORDER — SODIUM CHLORIDE 0.9 % IV SOLN: INTRAVENOUS | Status: AC

## 2021-02-16 NOTE — H&P (Signed)
History and Physical    Vanessa Garrett SFK:[id number] DOB: 04-21-1941 DOA: 02/16/2021  PCP: Einar Pheasant, DO  Patient coming from: Home via [place] ED  I have personally briefly reviewed patient's old medical records in [place]  Chief Complaint: Altered mental status  HPI: Vanessa Garrett is a 80 y.o. 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  History is supplemented by patient's daughter at bedside.  Patient is undergoing active chemotherapy with last treatment on 6/10.  Over the last 2 days she has had increased lethargy and stayed in bed all day yesterday.  She has been feeling intermittently lightheaded but has not fallen or lost consciousness.  She feels generally weak and states normally she can walk around the house on her own but uses a walker at other times.  She has had chills but no subjective fevers, diaphoresis, nausea, vomiting, chest pain, dyspnea, abdominal pain, dysuria, or lower extremity swelling.  She reports having loose stools 2 days ago which has since resolved.  She sees occasional spotting of blood on tissue paper after bowel movements without any other obvious bleeding.  Daughter states that earlier today patient was very confused but now appears to be back to her baseline mentation on arrival to [place] [facility].  [facility] ED Course:  Initial vitals showed BP 134/67, pulse 95, RR 18, temp 99.6 F, SPO2 97% on room air.  Labs show WBC 1.0, hemoglobin 6.1, platelets 29,000, ANC 500, sodium 129, potassium 3.6, bicarb 23, BUN 28, creatinine 2.14, serum glucose 126, lipase 21, high-sensitivity troponin I 16x2, ammonia 13.  Urinalysis negative  for UTI.  Blood and urine cultures obtained and pending.  Pathology smear review obtained.  SARS-CoV-2 PCR panel negative.  FOBT negative.  Portable chest x-ray showed Port-A-Cath with tip over SVC without focal consolidation, edema, or effusion.  CT head without contrast negative for acute intracranial abnormality.  Chronic microvascular ischemic changes noted.  Patient was given 500 cc normal saline bolus.  EDP discussed with patient's oncologist, Dr. Marin Olp, who recommended obtaining blood and urine cultures, starting cefepime, and medical admission.  The hospitalist service was consulted to admit for further evaluation and management.  Review of Systems:  All systems reviewed and are negative except as documented in history of present illness above.   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   IR IMAGING GUIDED PORT INSERTION  06/30/2020   UPPER GASTROINTESTINAL ENDOSCOPY      Social History:  reports that she quit smoking about 51 years ago. Her smoking use included cigarettes. She has a 2.50 pack-year smoking history. She has  never used smokeless tobacco. She reports previous alcohol use. She reports that she does not use drugs.  No Known Allergies  Family History  Problem Relation Age of Onset   Cancer Maternal Aunt        not sure the type   Colon cancer Neg Hx    Esophageal cancer Neg Hx    Stomach cancer Neg Hx    Rectal cancer Neg Hx      Prior  to Admission medications   Medication Sig Start Date End Date Taking? Authorizing Provider  dexamethasone (DECADRON) 4 MG tablet Take 2 tablets (8 mg total) by mouth daily. Start the day after carboplatin chemotherapy for 3 days. 01/28/21   Volanda Napoleon, MD  dronabinol (MARINOL) 2.5 MG capsule Take 1 capsule (2.5 mg total) by mouth 2 (two) times daily before a meal. 02/08/21   Ennever, Rudell Cobb, MD  epoetin alfa-epbx (RETACRIT) 51700 UNIT/ML injection Inject 1 mL (10,000 Units total) into the skin 3 (three) times a week. 02/16/21   Volanda Napoleon, MD  Iron-Vitamins (S.S.S. TONIC PO) Take 45 mLs by mouth every other day.    [provider]  lidocaine-prilocaine (EMLA) cream Apply to affected area once 11/26/20   Ennever, Rudell Cobb, MD  LORazepam (ATIVAN) 0.5 MG tablet Take 1 tablet (0.5 mg total) by mouth every 6 (six) hours as needed (Nausea or vomiting). Patient not taking: No sig reported 11/26/20   Volanda Napoleon, MD  metoprolol tartrate (LOPRESSOR) 25 MG tablet TAKE 1 TABLET(25 MG) BY MOUTH TWICE DAILY 01/03/21   Volanda Napoleon, MD  NON FORMULARY Take 2 tablets by mouth daily. [employer] - Blood Builder    [provider]  NON FORMULARY 1.25 mg every other day. Beet Root Powder    [provider]  ondansetron (ZOFRAN) 8 MG tablet Take 1 tablet (8 mg total) by mouth 2 (two) times daily as needed (Nausea or vomiting). Patient not taking: No sig reported 11/26/20   Volanda Napoleon, MD  potassium chloride SA (KLOR-CON) 20 MEQ tablet TAKE 1 TABLET BY MOUTH EVERY DAY 02/01/21   Volanda Napoleon, MD  prochlorperazine (COMPAZINE) 10 MG tablet Take 1 tablet (10 mg total) by mouth every 6 (six) hours as needed (Nausea or vomiting). Patient not taking: No sig reported 11/26/20   Volanda Napoleon, MD  triamterene-hydrochlorothiazide (MAXZIDE) 75-50 MG tablet Take 1 tablet by mouth daily. 01/07/21   Volanda Napoleon, MD    Physical Exam: Vitals:   02/16/21 1509 02/16/21 1512 02/16/21  1535 02/16/21 2000  BP: (!) 128/57 116/68  (!) 149/68  Pulse: 82 84  91  Resp: (!) 22 (!) 22  18  Temp:      TempSrc:      SpO2: 95% 95%  98%  Weight:   67.8 kg   Height:   5\' 1"  (1.549 m)    Constitutional: Resting supine in bed, NAD, calm, comfortable Eyes: PERRL, EOMI ENMT: Mucous membranes are moist. Posterior pharynx clear of any exudate or lesions.Normal dentition.  Neck: normal, supple, no masses. Respiratory: clear to auscultation bilaterally, no wheezing, no crackles. Normal respiratory effort. No accessory muscle use.  Cardiovascular: Regular rate and rhythm, no murmurs / rubs / gallops. No extremity edema. 2+ pedal pulses.  Port-A-Cath in place right chest wall. Abdomen: no tenderness, no masses palpated. No hepatosplenomegaly.  Musculoskeletal: no clubbing / cyanosis. No joint deformity upper and lower extremities. Good ROM, no contractures. Normal muscle tone.  Skin: no rashes, lesions, ulcers. No induration Neurologic: CN 2-12 grossly intact. Sensation intact. Strength 5/5 in all 4.  Psychiatric: Normal judgment and insight. Alert and oriented x 3. Normal mood.   Labs on Admission: I have personally reviewed following labs and imaging studies  CBC: Recent Labs  Lab 02/16/21 1406  WBC 1.0*  NEUTROABS 0.5*  HGB 6.1*  HCT 18.4*  MCV 92.9  PLT 29*   Basic Metabolic Panel: Recent Labs  Lab 02/16/21 1330  NA 129*  K 3.6  CL 97*  CO2 23  GLUCOSE 126*  BUN 28*  CREATININE 2.14*  CALCIUM 8.5*   GFR: Estimated Creatinine Clearance: 18.5 mL/min (A) (by C-G formula based on SCr of 2.14 mg/dL (H)). Liver Function Tests: Recent Labs  Lab 02/16/21 1330  AST 19  ALT 18  ALKPHOS 104  BILITOT 0.4  PROT 6.4*  ALBUMIN 3.5   Recent Labs  Lab 02/16/21 1330  LIPASE 21   Recent Labs  Lab 02/16/21 1406  AMMONIA 13   Coagulation Profile: No results for input(s): INR, PROTIME in the last 168 hours. Cardiac Enzymes: No results for input(s): CKTOTAL, CKMB,  CKMBINDEX, TROPONINI in the last 168 hours. BNP (last 3 results) No results for input(s): PROBNP in the last 8760 hours. HbA1C: No results for input(s): HGBA1C in the last 72 hours. CBG: No results for input(s): GLUCAP in the last 168 hours. Lipid Profile: No results for input(s): CHOL, HDL, LDLCALC, TRIG, CHOLHDL, LDLDIRECT in the last 72 hours. Thyroid Function Tests: No results for input(s): TSH, T4TOTAL, FREET4, T3FREE, THYROIDAB in the last 72 hours. Anemia Panel: No results for input(s): VITAMINB12, FOLATE, FERRITIN, TIBC, IRON, RETICCTPCT in the last 72 hours. Urine analysis:    Component Value Date/Time   COLORURINE YELLOW 02/16/2021 1625   APPEARANCEUR CLEAR 02/16/2021 1625   LABSPEC 1.020 02/16/2021 1625   PHURINE 5.5 02/16/2021 1625   GLUCOSEU NEGATIVE 02/16/2021 1625   HGBUR SMALL (A) 02/16/2021 1625   BILIRUBINUR NEGATIVE 02/16/2021 1625   KETONESUR NEGATIVE 02/16/2021 1625   PROTEINUR NEGATIVE 02/16/2021 1625   NITRITE NEGATIVE 02/16/2021 1625   LEUKOCYTESUR NEGATIVE 02/16/2021 1625    Radiological Exams on Admission: CT Head Wo Contrast  Result Date: 02/16/2021 CLINICAL DATA:  Mental status change EXAM: CT HEAD WITHOUT CONTRAST TECHNIQUE: Contiguous axial images were obtained from the base of the skull through the vertex without intravenous contrast. COMPARISON:  None. FINDINGS: Brain: There is no acute intracranial hemorrhage, mass effect, or edema. Gray-white differentiation is preserved. There is no extra-axial fluid collection. Patchy and confluent areas of low-attenuation in the supratentorial white matter are nonspecific but may reflect moderate chronic microvascular ischemic changes. Prominence of the ventricles and sulci reflects generalized parenchymal volume loss. Vascular: There is atherosclerotic calcification at the skull base. Skull: Calvarium is unremarkable. Sinuses/Orbits: Some frothy retained secretions in the left sphenoid sinus. No acute abnormality  of the orbits. Other: None. IMPRESSION: No acute intracranial abnormality. Chronic microvascular ischemic changes. Electronically Signed   By: Macy Mis M.D.   On: 02/16/2021 14:10   DG Chest Port 1 View  Result Date: 02/16/2021 CLINICAL DATA:  Weakness. EXAM: PORTABLE CHEST 1 VIEW COMPARISON:  No prior. FINDINGS: PowerPort catheter with tip over SVC. Heart size normal. No pulmonary venous congestion. No focal infiltrate. No pleural effusion or pneumothorax. IMPRESSION: PowerPort catheter noted with tip over SVC. No acute cardiopulmonary disease. Electronically Signed   By: Marcello Moores  Register   On: 02/16/2021 13:36    EKG: Personally  reviewed. Normal sinus rhythm without acute ischemic changes.  No prior for comparison.  Assessment/Plan Principal Problem:   Pancytopenia ([place]) Active Problems:   Gallbladder cancer ([facility])   CKD (chronic kidney disease), stage IV ([place])   Hyponatremia   Essential hypertension   Vanessa Garrett is a 80 y.o. 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  Pancytopenia with acute encephalopathy and fatigue/lethargy: Presenting with WBC 1.0, ANC 500, hemoglobin 6.1, platelets 29,000.  Symptoms likely from anemia.  Suspect chemotherapy associated with likely chronic blood loss.  Slightly elevated temperature noted in the ED.  Mental status improved on arrival to the floor. -Patient is a Jehovah's Witness and will not accept blood transfusion -Blood and urine cultures pending -Started on empiric IV cefepime  CKD stage IV: Near recent baseline.  Continue monitor.  Hyponatremia: Hold Maxide and start IV NS@75  mL/hour overnight.  Repeat labs in AM.  Generalized weakness: Request PT eval.  Stage IV adenocarcinoma of the gallbladder  with liver and lymph node metastases: Following with oncology, Dr. Marin Olp, on active chemotherapy with carboplatin/Gemzar and durvalumab.  Hypertension: Currently stable, holding home Lopressor and Maxide for now.  DVT prophylaxis: SCDs Code Status: Full code, confirmed with patient Family Communication: Discussed with patient's daughter at bedside Disposition Plan: From home, dispo pending clinical progress Consults called: Oncology Level of care: Stepdown Admission status:  Status is: Inpatient  Remains inpatient appropriate because:Inpatient level of care appropriate due to severity of illness  Dispo: The patient is from: Home              Anticipated d/c is to: Home              Patient currently is not medically stable to d/c.   Difficult to place patient No   Zada Finders MD [facility]  If 7PM-7AM, please contact night-coverage [email or web address]  02/16/2021, 8:46 PM

## 2021-02-16 NOTE — Progress Notes (Signed)
Vanessa Garrett  DOB [birth year]   H/o Stage IV adenocarcinoma of the gallbladder, anemia on ESA, Jehovah witness, received chemo on June/10, presented to [facility] with confusion, lethargy low-grade fever 99.6, found to have acute pancytopenia ,WBC 1 ,hemoglobin 6 ,platelet 29, sodium 129, no active bleeding ,COVID screening negative, chest x-ray no acute findings, Ct head no acute findings, per EDP patient is slightly confused but vital signs are stable. Patient is getting hydration, awaiting for urine sample collection. EDP discussed with oncology Dr. Marin Olp who recommended neuogen injection, panculture, start broad-spectrum antibiotics and admit to hospitalist service, patient is accepted to stepdown unit.

## 2021-02-16 NOTE — ED Notes (Signed)
ED Provider at bedside. 

## 2021-02-16 NOTE — Progress Notes (Signed)
Received notification from patient's daughter, [name], that patient will be brought to the ED at [facility]. She stayed in the bed all day yesterday and today continues to say she's weak. Her hgb last week was only 7.7 so they are worried her hgb has dropped more.   Dr Marin Olp notified of ED visit.  Oncology Nurse Navigator Documentation  Oncology Nurse Navigator Flowsheets 02/16/2021  Abnormal Finding Date -  Planned Course of Treatment -  Phase of Treatment -  Chemotherapy Actual Start Date: -  Navigator Follow Up Date: 02/17/2021  Navigator Follow Up Reason: Follow-up Appointment;Chemotherapy  Navigator Location [facility]  Referral Date to RadOnc/MedOnc -  Navigator Encounter Type Telephone  Telephone Patient Update;Incoming Call  Treatment Initiated Date -  Patient Visit Type MedOnc  Treatment Phase Active Tx  Barriers/Navigation Needs Coordination of Care;Education  Education -  Interventions Psycho-Social Support  Acuity Level 2-Minimal Needs (1-2 Barriers Identified)  Referrals -  Coordination of Care -  Education Method -  Support Groups/Services Friends and Family  Time Spent with Patient 15

## 2021-02-16 NOTE — ED Triage Notes (Signed)
Pt has hx of liver cancer, last chemo tx June 10th. Hx of low hemoglobin. Been weak and "incoherent" per daughter. Decreased PO intake. Disoriented to time, increasing confusion per daughter.

## 2021-02-16 NOTE — Telephone Encounter (Signed)
Pts daughter Beckie Busing came up advising that the pt was in the er with her sister in law as pt had some weakness,diarrhea,lethargic and had not had anything to eat/drink in about a day.  S/W Sheria Lang who confirmed that pt needed to stay/chkin at er and not come to our office   Webb Silversmith

## 2021-02-16 NOTE — Progress Notes (Signed)
Pharmacy Antibiotic Note  Tommi Crepeau is a 80 y.o. female admitted on 02/16/2021 with  febrile neutropenia .  Pharmacy has been consulted for Cefepime dosing.  Plan: Cefepime 2g IV q24h Monitor renal function, cultures, clinical course  Height: 5\' 1"  (154.9 cm) Weight: 67.8 kg (149 lb 8 oz) IBW/kg (Calculated) : 47.8  Temp (24hrs), Avg:99.6 F (37.6 C), Min:99.6 F (37.6 C), Max:99.6 F (37.6 C)  Recent Labs  Lab 02/16/21 1330 02/16/21 1406  WBC  --  1.0*  CREATININE 2.14*  --     Estimated Creatinine Clearance: 18.5 mL/min (A) (by C-G formula based on SCr of 2.14 mg/dL (H)).    No Known Allergies  Antimicrobials this admission: 6/22 Cefepime >>  Dose adjustments this admission: --  Microbiology results: 6/22 BCx: sent 6/22 UCx: sent  6/22 MRSA PCR: sent 6/22 Respiratory panel: COVID negative, Influenza A/B negative   Thank you for allowing pharmacy to be a part of this patient's care.  Luiz Ochoa 02/16/2021 8:59 PM

## 2021-02-16 NOTE — ED Provider Notes (Signed)
[facility] EMERGENCY DEPARTMENT Provider Note   CSN: [id number] Arrival date & time: 02/16/21  1233     History Chief Complaint  Patient presents with   Weakness    Vanessa Garrett is a 80 y.o. female.  She is brought in by her daughter who is providing most of the history.  She has a history of anemia and is getting active chemotherapy for adenocarcinoma of her gallbladder.  Dr. Marin Olp is her oncologist.  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  Last chemotherapy was on the 10th.  Due to have chemotherapy tomorrow.  For the last 2 days daughter has noticed her appetite dropped off and she has been more weak and unsteady on her feet.  Episodes of confusion.  No cough chest pain abdominal pain vomiting diarrhea or urinary symptoms.  No recent falls.  No known fevers.  The history is provided by the patient and a relative.  Weakness Severity:  Moderate Onset quality:  Gradual Duration:  2 days Timing:  Constant Progression:  Unchanged Chronicity:  New Relieved by:  Nothing Worsened by:  Activity Ineffective treatments:  None tried Associated symptoms: difficulty walking and melena   Associated symptoms: no abdominal pain, no chest pain, no cough, no diarrhea, no falls, no fever, no foul-smelling urine, no frequency, no headaches, no loss of consciousness, no nausea, no shortness of breath and no vomiting   Risk factors: anemia       Past Medical History:  Diagnosis Date   Arthritis    Cancer ([place])    Cataract    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   Gallbladder cancer ([place]) 06/21/2020   Goals of care, counseling/discussion 05/28/2020   Hyperlipidemia    Hypertension    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   IR IMAGING GUIDED PORT INSERTION  06/30/2020   UPPER GASTROINTESTINAL ENDOSCOPY       OB History   No obstetric history on file.     Family History  Problem Relation Age of Onset   Cancer Maternal Aunt        not sure the type   Colon cancer Neg Hx    Esophageal cancer Neg Hx    Stomach cancer Neg Hx    Rectal cancer Neg Hx     Social History   Tobacco Use   Smoking status: Former    Packs/day: 0.25    Years: 10.00    Pack years: 2.50    Types: Cigarettes    Quit date: 08/26/1969    Years since quitting: 51.5   Smokeless tobacco: Never  Vaping Use   Vaping Use: Never used  Substance Use Topics   Alcohol use: Not Currently   Drug use: Never    Home Medications Prior to Admission medications  Medication Sig Start Date End Date Taking? Authorizing Provider  dexamethasone (DECADRON) 4 MG tablet Take 2 tablets (8 mg total) by mouth daily. Start the day after carboplatin chemotherapy for 3 days. 01/28/21   Volanda Napoleon, MD  dronabinol (MARINOL) 2.5 MG capsule Take 1 capsule (2.5 mg total) by mouth 2 (two) times daily before a meal. 02/08/21   Ennever, Rudell Cobb, MD  epoetin alfa-epbx (RETACRIT) 78242 UNIT/ML injection Inject 1 mL (10,000 Units total) into the skin 3 (three) times a week. 02/16/21   Volanda Napoleon, MD  Iron-Vitamins (S.S.S. TONIC PO) Take 45 mLs by mouth every other day.    [provider]  lidocaine-prilocaine (EMLA) cream Apply to affected area once 11/26/20   Ennever, Rudell Cobb, MD  LORazepam (ATIVAN) 0.5 MG tablet Take 1  tablet (0.5 mg total) by mouth every 6 (six) hours as needed (Nausea or vomiting). Patient not taking: No sig reported 11/26/20   Volanda Napoleon, MD  metoprolol tartrate (LOPRESSOR) 25 MG tablet TAKE 1 TABLET(25 MG) BY MOUTH TWICE DAILY 01/03/21   Volanda Napoleon, MD  NON FORMULARY Take 2 tablets by mouth daily. [employer] - Blood Builder    [provider]  NON FORMULARY 1.25 mg every other day. Beet Root Powder    [provider]  ondansetron (ZOFRAN) 8 MG tablet Take 1 tablet (8 mg total) by mouth 2 (two) times daily as needed (Nausea or vomiting). Patient not taking: No sig reported 11/26/20   Volanda Napoleon, MD  potassium chloride SA (KLOR-CON) 20 MEQ tablet TAKE 1 TABLET BY MOUTH EVERY DAY 02/01/21   Volanda Napoleon, MD  prochlorperazine (COMPAZINE) 10 MG tablet Take 1 tablet (10 mg total) by mouth every 6 (six) hours as needed (Nausea or vomiting). Patient not taking: No sig reported 11/26/20   Volanda Napoleon, MD  triamterene-hydrochlorothiazide (MAXZIDE) 75-50 MG tablet Take 1 tablet by mouth daily. 01/07/21   Volanda Napoleon, MD    Allergies    Patient has no known allergies.  Review of Systems   Review of Systems  Constitutional:  Negative for fever.  HENT:  Negative for sore throat.   Eyes:  Positive for redness (right, had injection ophtho).  Respiratory:  Negative for cough and shortness of breath.   Cardiovascular:  Negative for chest pain.  Gastrointestinal:  Positive for melena. Negative for abdominal pain, diarrhea, nausea and vomiting.  Genitourinary:  Negative for frequency.  Musculoskeletal:  Positive for gait problem. Negative for falls and neck pain.  Skin:  Negative for rash.  Neurological:  Positive for weakness. Negative for loss of consciousness and headaches.   Physical Exam Updated Vital Signs BP 134/67 (BP Location: Right Arm)   Pulse 95   Temp 99.6 F (37.6 C) (Oral)   Resp 18   Ht 5\' 1"  (1.549 m)   Wt 64.4 kg   SpO2 97%   BMI 26.81  kg/m   Physical Exam Vitals and nursing note reviewed.  Constitutional:      General: She is not in acute distress.    Appearance: Normal appearance. She is well-developed.  HENT:     Head: Normocephalic and atraumatic.  Eyes:     Comments: She has some conjunctival injection in her right eye from recent ophthalmology procedure  Cardiovascular:     Rate and Rhythm: Normal rate and regular rhythm.     Heart sounds: No murmur heard. Pulmonary:     Effort: Pulmonary effort is  normal. No respiratory distress.     Breath sounds: Normal breath sounds. No stridor. No wheezing.  Abdominal:     Palpations: Abdomen is soft.     Tenderness: There is no abdominal tenderness.  Musculoskeletal:        General: No tenderness. Normal range of motion.     Cervical back: Neck supple.  Skin:    General: Skin is warm and dry.  Neurological:     General: No focal deficit present.     Mental Status: She is alert. She is disoriented.     GCS: GCS eye subscore is 4. GCS verbal subscore is 5. GCS motor subscore is 6.     Sensory: No sensory deficit.     Motor: No weakness.    ED Results / Procedures / Treatments   Labs (all labs ordered are listed, but only abnormal results are displayed) Labs Reviewed  COMPREHENSIVE METABOLIC PANEL - Abnormal; Notable for the following components:      Result Value   Sodium 129 (*)    Chloride 97 (*)    Glucose, Bld 126 (*)    BUN 28 (*)    Creatinine, Ser 2.14 (*)    Calcium 8.5 (*)    Total Protein 6.4 (*)    GFR, Estimated 23 (*)    All other components within normal limits  URINALYSIS, ROUTINE W REFLEX MICROSCOPIC - Abnormal; Notable for the following components:   Hgb urine dipstick SMALL (*)    All other components within normal limits  CBC WITH DIFFERENTIAL/PLATELET - Abnormal; Notable for the following components:   WBC 1.0 (*)    RBC 1.98 (*)    Hemoglobin 6.1 (*)    HCT 18.4 (*)    RDW 19.0 (*)    Platelets 29 (*)    Neutro Abs 0.5 (*)     Lymphs Abs 0.2 (*)    All other components within normal limits  URINALYSIS, MICROSCOPIC (REFLEX) - Abnormal; Notable for the following components:   Bacteria, UA RARE (*)    All other components within normal limits  RESP PANEL BY RT-PCR (FLU A&B, COVID) ARPGX2  CULTURE, BLOOD (ROUTINE X 2)  CULTURE, BLOOD (ROUTINE X 2)  URINE CULTURE  LIPASE, BLOOD  AMMONIA  OCCULT BLOOD X 1 CARD TO LAB, STOOL  CBC WITH DIFFERENTIAL/PLATELET  PATHOLOGIST SMEAR REVIEW  TROPONIN I (HIGH SENSITIVITY)  TROPONIN I (HIGH SENSITIVITY)    EKG EKG Interpretation  Date/Time:  Wednesday February 16 2021 13:01:36 EDT Ventricular Rate:  92 PR Interval:  162 QRS Duration: 89 QT Interval:  336 QTC Calculation: 416 R Axis:   -28 Text Interpretation: Sinus rhythm Borderline left axis deviation Anterior infarct, old No old tracing to compare Confirmed by Aletta Edouard [id number]) on 02/16/2021 1:06:44 PM  Radiology CT Head Wo Contrast  Result Date: 02/16/2021 CLINICAL DATA:  Mental status change EXAM: CT HEAD WITHOUT CONTRAST TECHNIQUE: Contiguous axial images were obtained from the base of the skull through the vertex without intravenous contrast. COMPARISON:  None. FINDINGS: Brain: There is no acute intracranial hemorrhage, mass effect, or edema. Gray-white differentiation is preserved. There is no extra-axial fluid collection. Patchy and confluent areas of low-attenuation in the supratentorial white matter are nonspecific but may reflect moderate chronic microvascular ischemic changes. Prominence of the ventricles and sulci reflects generalized parenchymal volume loss. Vascular: There is atherosclerotic calcification at the skull base. Skull: Calvarium is unremarkable. Sinuses/Orbits: Some frothy retained secretions in the left sphenoid sinus. No acute abnormality of  the orbits. Other: None. IMPRESSION: No acute intracranial abnormality. Chronic microvascular ischemic changes. Electronically Signed   By: Macy Mis M.D.   On: 02/16/2021 14:10   DG Chest Port 1 View  Result Date: 02/16/2021 CLINICAL DATA:  Weakness. EXAM: PORTABLE CHEST 1 VIEW COMPARISON:  No prior. FINDINGS: PowerPort catheter with tip over SVC. Heart size normal. No pulmonary venous congestion. No focal infiltrate. No pleural effusion or pneumothorax. IMPRESSION: PowerPort catheter noted with tip over SVC. No acute cardiopulmonary disease. Electronically Signed   By: Marcello Moores  Register   On: 02/16/2021 13:36    Procedures Procedures   Medications Ordered in ED Medications  sodium chloride 0.9 % bolus 500 mL (0 mLs Intravenous Stopped 02/16/21 1606)    ED Course  I have reviewed the triage vital signs and the nursing notes.  Pertinent labs & imaging results that were available during my care of the patient were reviewed by me and considered in my medical decision making (see chart for details).  Clinical Course as of 02/16/21 1850  Wed Feb 16, 2021  1250 Her states she just completed a course of antibiotics for a mild UTI treated by her primary care doctor. [MB]  3846 Chest x-ray without any acute infiltrates.  Awaiting radiology reading. [MB]  1447 Rectal exam done with Maggie as chaperone.  Normal tone no stool in vault.  Sent to lab for guaiac. [MB]  6599 Discussed with Dr. Marin Olp oncology.  He feels the patient should be admitted to the hospital.  Get blood cultures urine culture and started on some cefepime.  He think she will also need Procrit there.  He said he would find here tomorrow for further recommendations. [MB]    Clinical Course User Index [MB] Hayden Rasmussen, MD   MDM Rules/Calculators/A&P                         This patient complains of  generalized weakness, unsteady gait, poor p.o. intake confusion; this involves an extensive number of treatment Options and is a complaint that carries with it a high risk of complications and Morbidity. The differential includes dehydration, infection, anemia,  metabolic derangement, stroke, bleed  I ordered, reviewed and interpreted labs, which included CBC with neutropenia, hemoglobin low lower than baseline's, new thrombocytopenia, chemistries with low sodium, worsening BUN/creatinine, low calcium, COVID and flu testing negative, troponins flat I ordered medication IV fluids I ordered imaging studies which included chest x-ray and I independently    visualized and interpreted imaging which showed no acute infiltrates Additional history obtained from patient's daughter Previous records obtained and reviewed in epic including prior oncology notes I consulted Dr. Marin Olp oncology and discussed lab and imaging findings  Critical Interventions: None  After the interventions stated above, I reevaluated the patient and found patient still to be extremely tired.  Per recommendations of oncology will panculture and start on antibiotic's.  Signed out to oncoming provider Dr. Ronnald Nian to discuss with hospitalist service for admission.   Final Clinical Impression(s) / ED Diagnoses Final diagnoses:  Neutropenia, unspecified type ([place])  Anemia, unspecified type  Thrombocytopenia ([place])  Generalized weakness  Adenocarcinoma of gallbladder [facility])    Rx / DC Orders ED Discharge Orders     None        Hayden Rasmussen, MD 02/16/21 [phone]

## 2021-02-16 NOTE — ED Notes (Signed)
Report to [facility] for transport ETA 1820

## 2021-02-17 ENCOUNTER — Inpatient Hospital Stay: Payer: Medicare PPO

## 2021-02-17 ENCOUNTER — Inpatient Hospital Stay: Payer: Medicare PPO | Admitting: Hematology & Oncology

## 2021-02-17 ENCOUNTER — Encounter: Payer: Self-pay | Admitting: *Deleted

## 2021-02-17 LAB — CBC WITH DIFFERENTIAL/PLATELET
Abs Immature Granulocytes: 0.04 10*3/uL (ref 0.00–0.07)
Basophils Absolute: 0 10*3/uL (ref 0.0–0.1)
Basophils Relative: 0 %
Eosinophils Absolute: 0.1 10*3/uL (ref 0.0–0.5)
Eosinophils Relative: 8 %
HCT: 19.6 % — ABNORMAL LOW (ref 36.0–46.0)
Hemoglobin: 5.8 g/dL — CL (ref 12.0–15.0)
Immature Granulocytes: 5 %
Lymphocytes Relative: 29 %
Lymphs Abs: 0.2 10*3/uL — ABNORMAL LOW (ref 0.7–4.0)
MCH: 31 pg (ref 26.0–34.0)
MCHC: 29.6 g/dL — ABNORMAL LOW (ref 30.0–36.0)
MCV: 104.8 fL — ABNORMAL HIGH (ref 80.0–100.0)
Monocytes Absolute: 0.2 10*3/uL (ref 0.1–1.0)
Monocytes Relative: 19 %
Neutro Abs: 0.3 10*3/uL — CL (ref 1.7–7.7)
Neutrophils Relative %: 39 %
Platelets: 40 10*3/uL — ABNORMAL LOW (ref 150–400)
RBC: 1.87 MIL/uL — ABNORMAL LOW (ref 3.87–5.11)
RDW: 19.2 % — ABNORMAL HIGH (ref 11.5–15.5)
WBC: 0.8 10*3/uL — CL (ref 4.0–10.5)
nRBC: 0 % (ref 0.0–0.2)

## 2021-02-17 LAB — BASIC METABOLIC PANEL
Anion gap: 10 (ref 5–15)
BUN: 28 mg/dL — ABNORMAL HIGH (ref 8–23)
CO2: 22 mmol/L (ref 22–32)
Calcium: 8.1 mg/dL — ABNORMAL LOW (ref 8.9–10.3)
Chloride: 101 mmol/L (ref 98–111)
Creatinine, Ser: 1.92 mg/dL — ABNORMAL HIGH (ref 0.44–1.00)
GFR, Estimated: 26 mL/min — ABNORMAL LOW (ref >60)
Glucose, Bld: 94 mg/dL (ref 70–99)
Potassium: 3.4 mmol/L — ABNORMAL LOW (ref 3.5–5.1)
Sodium: 133 mmol/L — ABNORMAL LOW (ref 135–145)

## 2021-02-17 LAB — RETICULOCYTES
Immature Retic Fract: 26.2 % — ABNORMAL HIGH (ref 2.3–15.9)
RBC.: 1.88 MIL/uL — ABNORMAL LOW (ref 3.87–5.11)
Retic Count, Absolute: 27.1 10*3/uL (ref 19.0–186.0)
Retic Ct Pct: 1.4 % (ref 0.4–3.1)

## 2021-02-17 LAB — FERRITIN: Ferritin: 1731 ng/mL — ABNORMAL HIGH (ref 11–307)

## 2021-02-17 LAB — IRON AND TIBC
Iron: 45 ug/dL (ref 28–170)
Saturation Ratios: 21 % (ref 10.4–31.8)
TIBC: 212 ug/dL — ABNORMAL LOW (ref 250–450)
UIBC: 167 ug/dL

## 2021-02-17 LAB — FOLATE: Folate: 49.9 ng/mL (ref >5.9)

## 2021-02-17 LAB — VITAMIN B12: Vitamin B-12: 406 pg/mL (ref 180–914)

## 2021-02-17 MED ORDER — FERUMOXYTOL INJECTION 510 MG/17 ML
510.0000 mg | Freq: Once | INTRAVENOUS | Status: AC
Start: 2021-02-17 — End: 2021-02-17
  Administered 2021-02-17: 510 mg via INTRAVENOUS
  Filled 2021-02-17: qty 510

## 2021-02-17 MED ORDER — METOPROLOL TARTRATE 25 MG PO TABS
25.0000 mg | ORAL_TABLET | Freq: Two times a day (BID) | ORAL | Status: DC
  Administered 2021-02-17 – 2021-02-21 (×9): 25 mg via ORAL
  Filled 2021-02-17 (×9): qty 1

## 2021-02-17 MED ORDER — POTASSIUM CHLORIDE CRYS ER 20 MEQ PO TBCR
40.0000 meq | EXTENDED_RELEASE_TABLET | Freq: Once | ORAL | Status: AC
  Administered 2021-02-17: 40 meq via ORAL
  Filled 2021-02-17: qty 2

## 2021-02-17 MED ORDER — ADULT MULTIVITAMIN W/MINERALS CH
1.0000 | ORAL_TABLET | Freq: Every day | ORAL | Status: DC
  Administered 2021-02-18 – 2021-02-21 (×4): 1 via ORAL
  Filled 2021-02-17 (×4): qty 1

## 2021-02-17 MED ORDER — DRONABINOL 2.5 MG PO CAPS
2.5000 mg | ORAL_CAPSULE | Freq: Two times a day (BID) | ORAL | Status: DC
  Administered 2021-02-18 – 2021-02-21 (×6): 2.5 mg via ORAL
  Filled 2021-02-17 (×6): qty 1

## 2021-02-17 MED ORDER — TBO-FILGRASTIM 480 MCG/0.8ML ~~LOC~~ SOSY
480.0000 ug | PREFILLED_SYRINGE | Freq: Every day | SUBCUTANEOUS | Status: DC
  Administered 2021-02-17 – 2021-02-20 (×4): 480 ug via SUBCUTANEOUS
  Filled 2021-02-17 (×5): qty 0.8

## 2021-02-17 MED ORDER — EPOETIN ALFA 20000 UNIT/ML IJ SOLN
40000.0000 [IU] | Freq: Every day | INTRAMUSCULAR | Status: AC
  Administered 2021-02-17 – 2021-02-21 (×5): 40000 [IU] via SUBCUTANEOUS
  Filled 2021-02-17: qty 1
  Filled 2021-02-17 (×5): qty 2

## 2021-02-17 MED ORDER — DIPHENHYDRAMINE HCL 50 MG/ML IJ SOLN
12.5000 mg | Freq: Once | INTRAMUSCULAR | Status: AC
  Administered 2021-02-17: 12.5 mg via INTRAVENOUS
  Filled 2021-02-17: qty 1

## 2021-02-17 MED ORDER — LACTULOSE ENCEPHALOPATHY 10 GM/15ML PO SOLN
10.0000 g | Freq: Every day | ORAL | Status: DC | PRN
  Filled 2021-02-17: qty 15

## 2021-02-17 NOTE — Consult Note (Signed)
@LOGO @ Hematology and Oncology Follow Up Visit  Vanessa Garrett [id number] 03/27/1941 80 y.o. 02/17/2021   Principle Diagnosis:  Metastatic adenocarcinoma of the gallbladder  Current Therapy:   Patient admitted with pancytopenia, she is a Sales promotion account executive Garrett.     Interim History:  Vanessa Garrett is admitted to the ICU at [facility].  She is well-known to me.  She is a very nice 80 year old African-American female.  She has metastatic adenocarcinoma of the gallbladder.  She is on systemic chemotherapy with carboplatinum/gemcitabine/Durvalumab.  She actually had an MRI done recently.  The MRI did show that she was responding.  We have been following her CA 19-9.  This is come down nicely.  Back in April, her last level was 1882.  She was taken to the ER at [facility] yesterday.  She was pancytopenic.  She is quite fatigued and weak.  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  Her hemoglobin was quite low.    She has been getting Procrit at home.  She ultimately was admitted.  She had low-grade temperature.  This morning, her white cell count 0.8.  Hemoglobin 5.8.  Platelet count 40,000.  Her electrolytes show a sodium 129.  Potassium 3.6.  BUN 28 creatinine 2.14.  Calcium 8.5.  Albumin 3.5.  Her iron studies showed iron saturation of 21%.  She had a chest x-ray done.  This was negative.  She had a CT of the brain.  This was also was negative.  Blood cultures have been taken.  She clearly needs to have Neupogen and Procrit.  I would try to do the Procrit daily given that she is a Vanessa Garrett.  I do think that she probably would benefit from a iron infusion.  She was weak.  She was little bit tired.  She knew who I was.  Currently, her performance status is ECOG 2.  Medications:  Current Facility-Administered Medications:    0.9 %  sodium chloride infusion, , Intravenous, Continuous, Lenore Cordia, MD, Last Rate: 75 mL/hr at 02/17/21 0635,  Infusion Verify at 02/17/21 3570   acetaminophen (TYLENOL) tablet 650 mg, 650 mg, Oral, Q6H PRN **OR** acetaminophen (TYLENOL) suppository 650 mg, 650 mg, Rectal, Q6H PRN, Posey Pronto, Vishal R, MD   ceFEPIme (MAXIPIME) 2 g in sodium chloride 0.9 % 100 mL IVPB, 2 g, Intravenous, Q24H, Gadhia, Jigna M, RPH   Chlorhexidine Gluconate Cloth 2 % PADS 6 each, 6 each, Topical, Q0600, Lenore Cordia, MD, 6 each at 02/17/21 0000   epoetin alfa (EPOGEN) injection 40,000 Units, 40,000 Units, Subcutaneous, QPC breakfast, Namon Villarin, Rudell Cobb, MD   ondansetron (ZOFRAN) tablet 4 mg, 4 mg, Oral, Q6H PRN **OR** ondansetron (ZOFRAN) injection 4 mg, 4 mg, Intravenous, Q6H PRN, Patel, Vishal R, MD   senna-docusate (Senokot-S) tablet 1 tablet, 1 tablet, Oral, QHS PRN, Posey Pronto, Vishal R, MD   sodium chloride flush (NS) 0.9 % injection 3 mL, 3 mL, Intravenous, Q12H, Patel, Vishal R, MD, 3 mL at 02/16/21 2229   Tbo-Filgrastim (GRANIX) injection 480 mcg, 480 mcg, Subcutaneous, q1800, Janetta Vandoren, Rudell Cobb, MD  Facility-Administered Medications Ordered in Other Encounters:    0.9 %  sodium chloride infusion, , Intravenous, Once, Vanessa Garrett, Rudell Cobb, MD   sodium chloride flush (NS) 0.9 % injection 10 mL, 10 mL, Intracatheter, PRN, Volanda Napoleon, MD, 10 mL at 11/19/20 1535  Allergies: No Known Allergies  Past Medical History, Surgical history, Social history, and Family History were reviewed and updated.  Review of Systems: Review of Systems  Constitutional:  Positive for fatigue and fever. Negative for appetite change.  HENT:  Negative.    Eyes: Negative.   Respiratory: Negative.    Cardiovascular:  Positive for leg swelling.  Gastrointestinal: Negative.   Endocrine: Negative.   Genitourinary: Negative.    Musculoskeletal: Negative.   Skin: Negative.   Neurological:  Positive for dizziness.  Hematological: Negative.   Psychiatric/Behavioral: Negative.     Physical Exam:  height is 5\' 1"  (1.549 m) and weight is 149 lb 8 oz  (67.8 kg). Her oral temperature is 98.5 F (36.9 C). Her blood pressure is 138/64 and her pulse is 77. Her respiration is 20 and oxygen saturation is 98%.   Wt Readings from Last 3 Encounters:  02/16/21 149 lb 8 oz (67.8 kg)  01/28/21 156 lb 0.6 oz (70.8 kg)  01/07/21 162 lb 6.4 oz (73.7 kg)    Physical Exam Vitals reviewed.  HENT:     Head: Normocephalic and atraumatic.  Eyes:     Pupils: Pupils are equal, round, and reactive to light.  Cardiovascular:     Rate and Rhythm: Normal rate and regular rhythm.     Heart sounds: Normal heart sounds.  Pulmonary:     Effort: Pulmonary effort is normal.     Breath sounds: Normal breath sounds.  Abdominal:     General: Bowel sounds are normal.     Palpations: Abdomen is soft.  Musculoskeletal:        General: No tenderness or deformity. Normal range of motion.     Cervical back: Normal range of motion.  Lymphadenopathy:     Cervical: No cervical adenopathy.  Skin:    General: Skin is warm and dry.     Findings: No erythema or rash.  Neurological:     Mental Status: She is alert and oriented to person, place, and time.  Psychiatric:        Behavior: Behavior normal.        Thought Content: Thought content normal.        Judgment: Judgment normal.     Lab Results  Component Value Date   WBC 0.8 (LL) 02/17/2021   HGB 5.8 (LL) 02/17/2021   HCT 19.6 (L) 02/17/2021   MCV 104.8 (H) 02/17/2021   PLT 40 (L) 02/17/2021   @LASTCHEMISTRY @    Impression and Plan: Vanessa Garrett is a very nice 80 year old African-American female.  She has metastatic gallbladder cancer.  She is responding to treatment.  She actually is done pretty well with treatment until recently.  Her blood counts clearly are affected by the chemotherapy.  Were going to have to adjust the dose of chemotherapy.  We will have to alter her protocol a little bit.  Again in the hospital she is on IV antibiotics.  We will await cultures.  She needs Neupogen daily.  She  needs Procrit daily.  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  We really need to get her hemoglobin better.  I think this will improve her overall performance status.  Her platelet count is low but we do not need to transfuse her.  She is in the ICU.  We will follow along.  I know she will get outstanding care from all the staff down in the ICU.  I appreciate their hard work and Child psychotherapist.   Lattie Haw, MD  Galatians  5:25-26   Volanda Napoleon, MD 6/23/20227:08 AM

## 2021-02-17 NOTE — Progress Notes (Signed)
Patient received on unit, patient appears calm and comfortable. Alert and oriented x4. Vitals are stable.

## 2021-02-17 NOTE — Progress Notes (Signed)
PROGRESS NOTE    Vanessa Garrett  LFY:[id number] DOB: 22-Apr-1941 DOA: 02/16/2021 PCP: Einar Pheasant, DO   Brief Narrative:  HPI: Vanessa Garrett is a 80 y.o. 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  History is supplemented by patient's daughter at bedside.  Patient is undergoing active chemotherapy with last treatment on 6/10.  Over the last 2 days she has had increased lethargy and stayed in bed all day yesterday.  She has been feeling intermittently lightheaded but has not fallen or lost consciousness.  She feels generally weak and states normally she can walk around the house on her own but uses a walker at other times.  She has had chills but no subjective fevers, diaphoresis, nausea, vomiting, chest pain, dyspnea, abdominal pain, dysuria, or lower extremity swelling.  She reports having loose stools 2 days ago which has since resolved.  She sees occasional spotting of blood on tissue paper after bowel movements without any other obvious bleeding.  Daughter states that earlier today patient was very confused but now appears to be back to her baseline mentation on arrival to [facility].   [facility] ED Course:  Initial vitals showed BP 134/67, pulse 95, RR 18, temp 99.6 F, SPO2 97% on room air.   Labs show WBC 1.0, hemoglobin 6.1, platelets 29,000, ANC 500, sodium 129, potassium 3.6, bicarb 23, BUN 28, creatinine 2.14, serum glucose 126, lipase 21, high-sensitivity troponin I 16x2, ammonia 13.   Urinalysis negative for UTI.  Blood and urine cultures obtained and pending.  Pathology smear review obtained.  SARS-CoV-2 PCR panel negative.  FOBT negative.   Portable chest x-ray showed  Port-A-Cath with tip over SVC without focal consolidation, edema, or effusion.   CT head without contrast negative for acute intracranial abnormality.  Chronic microvascular ischemic changes noted.   Patient was given 500 cc normal saline bolus.  EDP discussed with patient's oncologist, Dr. Marin Olp, who recommended obtaining blood and urine cultures, starting cefepime, and medical admission.  The hospitalist service was consulted to admit for further evaluation and management.  Assessment & Plan:   Principal Problem:   Pancytopenia ([place]) Active Problems:   Gallbladder cancer ([place])   CKD (chronic kidney disease), stage IV (HCC)   Hyponatremia   Essential hypertension   Pancytopenia with fatigue/lethargy: Presenting with WBC 1.0, ANC 500, hemoglobin 6.1, platelets 29,000.  Symptoms likely from anemia.  Suspect chemotherapy associated with likely chronic blood loss.   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  Patient was started on antibiotics per oncology recommendation and we will continue that as they recommend and wait for cultures.  Her WBC and hemoglobin dropped further but platelets improved.  Oncology is managing that and she is getting daily Neupogen and Procrit.  Acute metabolic encephalopathy: Patient was having altered mental status upon presentation to ED which resolved as soon as hospitalist had seen her.  She is fully alert and oriented today.   CKD stage IV: Near recent baseline.  Continue monitor.   Hyponatremia: Improved, currently 133.   Generalized weakness: PT OT consulted.  Hypokalemia: 3.4.  Will replace.  Stage IV adenocarcinoma of the gallbladder with liver and lymph node metastases: Oncology on board and management  per them.   Hypertension: Blood pressure slightly elevated, resume Lopressor but hold Maxide.  DVT prophylaxis: SCDs Start: 02/16/21 2120   Code Status: Full Code  Family Communication: Son present at bedside.   Plan of care discussed with patient in length and he verbalized understanding and agreed with it.  Status is: Inpatient  Remains inpatient appropriate because:Inpatient level of care appropriate due to severity of illness  Dispo: The patient is from: Home              Anticipated d/c is to: Home              Patient currently is not medically stable to d/c.   Difficult to place patient No        Estimated body mass index is 28.25 kg/m as calculated from the following:   Height as of this encounter: 5\' 1"  (1.549 m).   Weight as of this encounter: 67.8 kg.      Nutritional status:               Consultants:  Oncology  Procedures:  None  Antimicrobials:  Anti-infectives (From admission, onward)    Start     Dose/Rate Route Frequency Ordered Stop   02/17/21 2200  ceFEPIme (MAXIPIME) 2 g in sodium chloride 0.9 % 100 mL IVPB        2 g 200 mL/hr over 30 Minutes Intravenous Every 24 hours 02/16/21 2058     02/16/21 2145  ceFEPIme (MAXIPIME) 2 g in sodium chloride 0.9 % 100 mL IVPB        2 g 200 mL/hr over 30 Minutes Intravenous STAT 02/16/21 2054 02/16/21 2209          Subjective: Patient seen and examined.  Her son is at the bedside.  Patient is fully alert and oriented and she has no complaints.  Objective: Vitals:   02/17/21 0700 02/17/21 0747 02/17/21 0800 02/17/21 0900  BP: 130/78  (!) 148/62 (!) 138/57  Pulse: 80  78 79  Resp: 19  19 20   Temp:  97.6 F (36.4 C)    TempSrc:  Oral    SpO2: 97%  98% 98%  Weight:      Height:        Intake/Output Summary (Last 24 hours) at 02/17/2021 0952 Last data filed at 02/17/2021 5681 Gross per 24 hour  Intake 1953.18 ml  Output 300 ml  Net 1653.18 ml   Filed Weights   02/16/21 1244 02/16/21 1535  Weight: 64.4 kg 67.8 kg    Examination:  General exam: Appears calm and comfortable  Respiratory system: Clear to auscultation. Respiratory effort normal. Cardiovascular system: S1 & S2 heard, RRR.  No JVD, murmurs, rubs, gallops or clicks. No pedal edema. Gastrointestinal system: Abdomen is nondistended, soft and nontender. No organomegaly or masses felt. Normal bowel sounds heard. Central nervous system: Alert and oriented. No focal neurological deficits. Extremities: Symmetric 5 x 5 power. Skin: No rashes, lesions or ulcers Psychiatry: Judgement and insight appear normal. Mood & affect appropriate.    Data Reviewed: I have personally reviewed following labs and imaging studies  CBC: Recent Labs  Lab 02/16/21 1406 02/17/21 0233  WBC 1.0* 0.8*  NEUTROABS 0.5* 0.3*  HGB 6.1* 5.8*  HCT 18.4* 19.6*  MCV 92.9 104.8*  PLT 29* 40*   Basic Metabolic Panel: Recent Labs  Lab 02/16/21 1330 02/17/21 0233  NA 129* 133*  K 3.6 3.4*  CL 97* 101  CO2 23 22  GLUCOSE  126* 94  BUN 28* 28*  CREATININE 2.14* 1.92*  CALCIUM 8.5* 8.1*   GFR: Estimated Creatinine Clearance: 20.6 mL/min (A) (by C-G formula based on SCr of 1.92 mg/dL (H)). Liver Function Tests: Recent Labs  Lab 02/16/21 1330  AST 19  ALT 18  ALKPHOS 104  BILITOT 0.4  PROT 6.4*  ALBUMIN 3.5   Recent Labs  Lab 02/16/21 1330  LIPASE 21   Recent Labs  Lab 02/16/21 1406  AMMONIA 13   Coagulation Profile: No results for input(s): INR, PROTIME in the last 168 hours. Cardiac Enzymes: No results for input(s): CKTOTAL, CKMB, CKMBINDEX, TROPONINI in the last 168 hours. BNP (last 3 results) No results for input(s): PROBNP in the last 8760 hours. HbA1C: No results for input(s): HGBA1C in the last 72 hours. CBG: No results for input(s): GLUCAP in the last 168 hours. Lipid Profile: No results for input(s): CHOL, HDL, LDLCALC, TRIG, CHOLHDL, LDLDIRECT in the last 72 hours. Thyroid Function Tests: No results for input(s): TSH, T4TOTAL, FREET4, T3FREE, THYROIDAB in the last 72 hours. Anemia Panel: Recent Labs    02/17/21 0233  VITAMINB12 406  FOLATE 49.9  FERRITIN 1,731*  TIBC 212*  IRON 45  RETICCTPCT  1.4   Sepsis Labs: No results for input(s): PROCALCITON, LATICACIDVEN in the last 168 hours.  Recent Results (from the past 240 hour(s))  Resp Panel by RT-PCR (Flu A&B, Covid) Nasopharyngeal Swab     Status: None   Collection Time: 02/16/21  1:30 PM   Specimen: Nasopharyngeal Swab; Nasopharyngeal(NP) swabs in vial transport medium  Result Value Ref Range Status   SARS Coronavirus 2 by RT PCR NEGATIVE NEGATIVE Final    Comment: (NOTE) SARS-CoV-2 target nucleic acids are NOT DETECTED.  The SARS-CoV-2 RNA is generally detectable in upper respiratory specimens during the acute phase of infection. The lowest concentration of SARS-CoV-2 viral copies this assay can detect is 138 copies/mL. A negative result does not preclude SARS-Cov-2 infection and should not be used as the sole basis for treatment or other patient management decisions. A negative result may occur with  improper specimen collection/handling, submission of specimen other than nasopharyngeal swab, presence of viral mutation(s) within the areas targeted by this assay, and inadequate number of viral copies(<138 copies/mL). A negative result must be combined with clinical observations, patient history, and epidemiological information. The expected result is Negative.  Fact Sheet for Patients:  EntrepreneurPulse.com.au  Fact Sheet for Healthcare Providers:  IncredibleEmployment.be  This test is no t yet approved or cleared by the [place] FDA and  has been authorized for detection and/or diagnosis of SARS-CoV-2 by FDA under an Emergency Use Authorization (EUA). This EUA will remain  in effect (meaning this test can be used) for the duration of the COVID-19 declaration under Section 564(b)(1) of the Act, 21 U.S.C.section 360bbb-3(b)(1), unless the authorization is terminated  or revoked sooner.       Influenza A by PCR NEGATIVE NEGATIVE Final   Influenza B by PCR NEGATIVE  NEGATIVE Final    Comment: (NOTE) The Xpert Xpress SARS-CoV-2/FLU/RSV plus assay is intended as an aid in the diagnosis of influenza from Nasopharyngeal swab specimens and should not be used as a sole basis for treatment. Nasal washings and aspirates are unacceptable for Xpert Xpress SARS-CoV-2/FLU/RSV testing.  Fact Sheet for Patients: EntrepreneurPulse.com.au  Fact Sheet for Healthcare Providers: IncredibleEmployment.be  This test is not yet approved or cleared by the [place] FDA and has been authorized for detection and/or diagnosis of SARS-CoV-2  by FDA under an Emergency Use Authorization (EUA). This EUA will remain in effect (meaning this test can be used) for the duration of the COVID-19 declaration under Section 564(b)(1) of the Act, 21 U.S.C. section 360bbb-3(b)(1), unless the authorization is terminated or revoked.  Performed at [facility], [address] [place], [place] [address]   MRSA PCR Screening     Status: None   Collection Time: 02/16/21  8:00 PM  Result Value Ref Range Status   MRSA by PCR NEGATIVE NEGATIVE Final    Comment:        The GeneXpert MRSA Assay (FDA approved for NASAL specimens only), is one component of a comprehensive MRSA colonization surveillance program. It is not intended to diagnose MRSA infection nor to guide or monitor treatment for MRSA infections. Performed at [facility], [place] [address], [place], [place] [address]       Radiology Studies: CT Head Wo Contrast  Result Date: 02/16/2021 CLINICAL DATA:  Mental status change EXAM: CT HEAD WITHOUT CONTRAST TECHNIQUE: Contiguous axial images were obtained from the base of the skull through the vertex without intravenous contrast. COMPARISON:  None. FINDINGS: Brain: There is no acute intracranial hemorrhage, mass effect, or edema. Gray-white differentiation is preserved. There is no extra-axial fluid collection.  Patchy and confluent areas of low-attenuation in the supratentorial white matter are nonspecific but may reflect moderate chronic microvascular ischemic changes. Prominence of the ventricles and sulci reflects generalized parenchymal volume loss. Vascular: There is atherosclerotic calcification at the skull base. Skull: Calvarium is unremarkable. Sinuses/Orbits: Some frothy retained secretions in the left sphenoid sinus. No acute abnormality of the orbits. Other: None. IMPRESSION: No acute intracranial abnormality. Chronic microvascular ischemic changes. Electronically Signed   By: Macy Mis M.D.   On: 02/16/2021 14:10   DG Chest Port 1 View  Result Date: 02/16/2021 CLINICAL DATA:  Weakness. EXAM: PORTABLE CHEST 1 VIEW COMPARISON:  No prior. FINDINGS: PowerPort catheter with tip over SVC. Heart size normal. No pulmonary venous congestion. No focal infiltrate. No pleural effusion or pneumothorax. IMPRESSION: PowerPort catheter noted with tip over SVC. No acute cardiopulmonary disease. Electronically Signed   By: [place]   On: 02/16/2021 13:36    Scheduled Meds:  Chlorhexidine Gluconate Cloth  6 each Topical Q0600   epoetin alfa  40,000 Units Subcutaneous QPC breakfast   sodium chloride flush  3 mL Intravenous Q12H   Tbo-Filgrastim  480 mcg Subcutaneous q1800   Continuous Infusions:  ceFEPime (MAXIPIME) IV       LOS: 1 day   Time spent: 36 minutes   Darliss Cheney, MD [facility]  02/17/2021, 9:52 AM   How to contact the [facility] Attending or Consulting provider [place] or covering provider during after hours [place], for this patient?  Check the care team in [facility] and look for a) attending/consulting [facility] provider listed and b) the [facility] team listed. Page or secure chat [phone]. Log into [email or web address] and use [place] universal password to access. If you do not have the password, please contact the hospital operator. Locate the [facility] provider you are looking for under [facility] and page to a number that you can be directly reached. If you still have difficulty reaching the provider, please page the [facility] (Director on Call) for the Hospitalists listed on amion for assistance.

## 2021-02-17 NOTE — Progress Notes (Signed)
Initial Nutrition Assessment  DOCUMENTATION CODES:   Non-severe (moderate) malnutrition in context of chronic illness  INTERVENTION:  - will order 1 tablet multivitamin with minerals/day. - family to bring in Boost that patient prefers.    NUTRITION DIAGNOSIS:   Moderate Malnutrition related to chronic illness, cancer and cancer related treatments as evidenced by moderate fat depletion, mild muscle depletion.  GOAL:   Patient will meet greater than or equal to 90% of their needs  MONITOR:   PO intake, Supplement acceptance, Labs, Weight trends  REASON FOR ASSESSMENT:   Malnutrition Screening Tool  ASSESSMENT:   80 y.o. female with medical history of stage 4 adenocarcinoma of the gallbladder with liver and lymph node mets on active chemo (last: 02/04/21), stage 4 CKD, HTN, iron deficiency anemia d/t chronic blood loss. She is a Restaurant manager, fast food. She presented to the ED d/t AMS, generalized weakness with intermittent lightheadedness, and lethargy.  Patient sitting in the chair with her son at bedside. Patient and son both provide information.   She has upper partials that fit well and when wearing them she has no chewing difficulties. She has not brought them to the hospital d/t concern of losing them so she has been ordering and consuming soft foods. She has no swallowing difficulties that she can remember.   Patient was started on marinol around January and this has been beneficial for her. Within the past 1-2 weeks it has been on backorder and patient was transitioned to megace. She began to have facial swelling after starting megace so discontinued use.   Patient often experiences early satiety and will begin to feel bloated/abdominal pressure if she eats too much. She eats small portions at meals and is concerned about over eating.   Her son reports that since losing weight, patient seems disinterested in gaining any weight back and has been limiting intakes at time d/t  this. Discussed with patient about the importance of adequate nutrition while undergoing chemo and to be able to continue undergoing treatment.   Weight yesterday was documented as both 149 lb and 142 lb. Weight on 6/3 was 156 lb. This indicates 7-14 lb weight loss (4.5-9% body weight) in the past 3 weeks; both significant for time frame.   At home she drinks 1-2 bottles of Boost/day that contains 20 grams protein/bottle. Patient and son are interested in family continuing to bring in this supplement rather than Ensure Enlive being ordered and provided here.     Labs reviewed; Na: 133 mmol/l, K: 3.4 mmol/l, BUN: 28 mg/dl, creatinine: 1.92 mg/dl, Ca: 8.1 mg/dl, GFR: 26 ml/min. Medications reviewed; 2.5 mg marinol BID, 510 mg IV feraheme x1 dose 6/23, 40 mEq Klor-Con x1 dose 6/23.    NUTRITION - FOCUSED PHYSICAL EXAM:  Flowsheet Row Most Recent Value  Orbital Region No depletion  Upper Arm Region Moderate depletion  Thoracic and Lumbar Region Unable to assess  Buccal Region No depletion  Temple Region No depletion  Clavicle Bone Region Mild depletion  Clavicle and Acromion Bone Region Mild depletion  Scapular Bone Region Mild depletion  Dorsal Hand No depletion  Patellar Region No depletion  Anterior Thigh Region Mild depletion  Posterior Calf Region Mild depletion  Edema (RD Assessment) None  Hair Reviewed  Eyes Reviewed  Mouth Reviewed  Skin Reviewed  Nails Reviewed       Diet Order:   Diet Order             Diet regular Room service appropriate? Yes; Fluid consistency:  Thin  Diet effective now                   EDUCATION NEEDS:   Education needs have been addressed  Skin:  Skin Assessment: Reviewed RN Assessment  Last BM:  PTA/unknown  Height:   Ht Readings from Last 1 Encounters:  02/16/21 5\' 1"  (1.549 m)    Weight:   Wt Readings from Last 1 Encounters:  02/16/21 67.8 kg      Estimated Nutritional Needs:  Kcal:  1700-1900 kcal Protein:   80-95 grams Fluid:  >/= 1.8 L/day     Jarome Matin, MS, RD, LDN, CNSC Inpatient Clinical Dietitian RD pager # available in AMION  After hours/weekend pager # available in [facility]

## 2021-02-17 NOTE — Progress Notes (Signed)
Patient experiencing facial edema, redness, and itching.  Notified MD, orders received.

## 2021-02-17 NOTE — Progress Notes (Signed)
Patient was to be seen today in the office for her next cycle of treatment. She was admitted yesterday from the ED. Will continue to follow for post discharge needs and follow up.  Oncology Nurse Navigator Documentation  Oncology Nurse Navigator Flowsheets 02/17/2021  Abnormal Finding Date -  Planned Course of Treatment -  Phase of Treatment -  Chemotherapy Actual Start Date: -  Navigator Follow Up Date: 02/22/2021  Navigator Follow Up Reason: Appointment Review  Navigator Location [facility]  Referral Date to RadOnc/MedOnc -  Navigator Encounter Type Appt/Treatment Plan Review  Telephone -  Treatment Initiated Date -  Patient Visit Type MedOnc  Treatment Phase Active Tx  Barriers/Navigation Needs Coordination of Care;Education  Education -  Interventions None Required  Acuity Level 2-Minimal Needs (1-2 Barriers Identified)  Referrals -  Coordination of Care -  Education Method -  Support Groups/Services Friends and Family  Time Spent with Patient 15

## 2021-02-17 NOTE — Evaluation (Signed)
Physical Therapy Evaluation Patient Details Name: Vanessa Garrett MRN: [id number] DOB: 18-Mar-1941 Today's Date: 02/17/2021   History of Present Illness  Vanessa Garrett is a 80 y.o. 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  Chronic microvascular ischemic changes noted.  Clinical Impression  Patient is resting in bed with son at bedside. Patient oriented to self and  in hospital. Patient lives with daughter who works from home. Per son, DC plans for return home w/ daughter.  Patient  requires min assistance for mobility: To sitting ,then stand and pivot to [facility], then a few steps to recliner with HHA.  BP 132/56, 99% sats, HR 77. After mobility. Pt admitted with above diagnosis.  Pt currently with functional limitations due to the deficits listed below (see PT Problem List). Pt will benefit from skilled PT to increase their independence and safety with mobility to allow discharge to the venue listed below.       Follow Up Recommendations Home health PT;Supervision/Assistance - 24 hour    Equipment Recommendations  Rolling walker with 5" wheels    Recommendations for Other Services   OT    Precautions / Restrictions Precautions Precautions: Fall      Mobility  Bed Mobility Overal bed mobility: Needs Assistance Bed Mobility: Supine to Sit     Supine to sit: Min guard     General bed mobility comments: extra time, able to move and sit on bed side  and scoot forward.    Transfers Overall transfer level: Needs assistance Equipment used: 1 person hand held assist Transfers: Sit to/from [employer] Sit to Stand: Min assist Stand pivot transfers: Min assist       General transfer comment: steady assist with HH to stand , multimodal cues to  step to [facility], then able to take a few teps with HHA to recliner.  Ambulation/Gait             General Gait Details: a few steps with HHA  Stairs            Wheelchair Mobility    Modified Rankin (Stroke Patients Only)       Balance Overall balance assessment: Needs assistance Sitting-balance support: Feet supported;Bilateral upper extremity supported Sitting balance-Leahy Scale: Fair     Standing balance support: During functional activity;Single extremity supported Standing balance-Leahy Scale: Poor Standing balance comment: steady assist required                             Pertinent Vitals/Pain Pain Assessment: No/denies pain    Home Living Family/patient expects to be discharged to:: Private residence Living Arrangements: Children Available Help at Discharge: Family;Available 24 hours/day Type of Home: House       Home Layout: Two level Home Equipment: None      Prior Function Level of Independence: Independent         Comments: does not cook, lives with Chartered loss adjuster, likes to work in yard.     Hand Dominance   Dominant Hand: Right    Extremity/Trunk Assessment   Upper Extremity Assessment Upper Extremity Assessment: Defer to OT evaluation    Lower Extremity Assessment Lower  Extremity Assessment: Generalized weakness    Cervical / Trunk Assessment Cervical / Trunk Assessment: Normal  Communication   Communication: No difficulties  Cognition Arousal/Alertness: Awake/alert Behavior During Therapy: WFL for tasks assessed/performed Overall Cognitive Status: Impaired/Different from baseline Area of Impairment: Orientation;Attention;Following commands;Safety/judgement;Awareness                 Orientation Level: Place;Time;Situation Current Attention Level:  Alternating   Following Commands: Follows multi-step commands consistently Safety/Judgement: Decreased awareness of deficits Awareness: Emergent   General Comments: per son, has been with daughter in [place] and patient not from this area.patient knows that she is in a hospital.      General Comments      Exercises     Assessment/Plan    PT Assessment Patient needs continued PT services  PT Problem List Decreased strength;Decreased mobility;Decreased safety awareness;Decreased knowledge of precautions;Decreased activity tolerance;Decreased cognition;Decreased balance;Decreased knowledge of use of DME       PT Treatment Interventions DME instruction;Therapeutic activities;Gait training;Therapeutic exercise;Patient/family education;Functional mobility training    PT Goals (Current goals can be found in the Care Plan section)  Acute Rehab PT Goals Patient Stated Goal: to go home PT Goal Formulation: With patient/family Time For Goal Achievement: 03/03/21 Potential to Achieve Goals: Good    Frequency Min 3X/week   Barriers to discharge        Co-evaluation               AM-PAC PT "6 Clicks" Mobility  Outcome Measure Help needed turning from your back to your side while in a flat bed without using bedrails?: A Little Help needed moving from lying on your back to sitting on the side of a flat bed without using bedrails?: A Little Help needed moving to and from a bed to a chair (including a wheelchair)?: A Little Help needed standing up from a chair using your arms (e.g., wheelchair or bedside chair)?: A Little Help needed to walk in hospital room?: A Lot Help needed climbing 3-5 steps with a railing? : Total 6 Click Score: 15    End of Session Equipment Utilized During Treatment: Gait belt Activity Tolerance: Patient tolerated treatment well Patient left: in chair;with call bell/phone within reach;with chair alarm set;with family/visitor present Nurse  Communication: Mobility status PT Visit Diagnosis: Unsteadiness on feet (R26.81);Difficulty in walking, not elsewhere classified (R26.2)    Time: 1505-6979 PT Time Calculation (min) (ACUTE ONLY): 31 min   Charges:   PT Evaluation $PT Eval Low Complexity: 1 Low PT Treatments $Therapeutic Activity: 8-22 mins        Tresa Endo PT Acute Rehabilitation Services Pager [phone] Office [phone]   Claretha Cooper 02/17/2021, 3:17 PM

## 2021-02-18 ENCOUNTER — Encounter: Payer: Self-pay | Admitting: *Deleted

## 2021-02-18 DIAGNOSIS — E440 Moderate protein-calorie malnutrition: Secondary | ICD-10-CM | POA: Insufficient documentation

## 2021-02-18 LAB — CBC WITH DIFFERENTIAL/PLATELET
Abs Immature Granulocytes: 0.01 10*3/uL (ref 0.00–0.07)
Basophils Absolute: 0 10*3/uL (ref 0.0–0.1)
Basophils Relative: 0 %
Eosinophils Absolute: 0.1 10*3/uL (ref 0.0–0.5)
Eosinophils Relative: 12 %
HCT: 19.4 % — ABNORMAL LOW (ref 36.0–46.0)
Hemoglobin: 6.1 g/dL — CL (ref 12.0–15.0)
Immature Granulocytes: 1 %
Lymphocytes Relative: 29 %
Lymphs Abs: 0.3 10*3/uL — ABNORMAL LOW (ref 0.7–4.0)
MCH: 30.7 pg (ref 26.0–34.0)
MCHC: 31.4 g/dL (ref 30.0–36.0)
MCV: 97.5 fL (ref 80.0–100.0)
Monocytes Absolute: 0.2 10*3/uL (ref 0.1–1.0)
Monocytes Relative: 23 %
Neutro Abs: 0.3 10*3/uL — CL (ref 1.7–7.7)
Neutrophils Relative %: 35 %
Platelets: 54 10*3/uL — ABNORMAL LOW (ref 150–400)
RBC: 1.99 MIL/uL — ABNORMAL LOW (ref 3.87–5.11)
RDW: 19 % — ABNORMAL HIGH (ref 11.5–15.5)
WBC: 0.9 10*3/uL — CL (ref 4.0–10.5)
nRBC: 0 % (ref 0.0–0.2)

## 2021-02-18 LAB — PATHOLOGIST SMEAR REVIEW

## 2021-02-18 LAB — URINE CULTURE: Culture: NO GROWTH

## 2021-02-18 LAB — BASIC METABOLIC PANEL
Anion gap: 6 (ref 5–15)
BUN: 28 mg/dL — ABNORMAL HIGH (ref 8–23)
CO2: 25 mmol/L (ref 22–32)
Calcium: 8.6 mg/dL — ABNORMAL LOW (ref 8.9–10.3)
Chloride: 105 mmol/L (ref 98–111)
Creatinine, Ser: 1.67 mg/dL — ABNORMAL HIGH (ref 0.44–1.00)
GFR, Estimated: 31 mL/min — ABNORMAL LOW (ref >60)
Glucose, Bld: 92 mg/dL (ref 70–99)
Potassium: 3.5 mmol/L (ref 3.5–5.1)
Sodium: 136 mmol/L (ref 135–145)

## 2021-02-18 MED ORDER — SODIUM CHLORIDE 0.9% FLUSH: 10.0000 mL | INTRAVENOUS | Status: DC | PRN

## 2021-02-18 MED ORDER — MEGESTROL ACETATE 400 MG/10ML PO SUSP
400.0000 mg | Freq: Two times a day (BID) | ORAL | Status: DC
  Administered 2021-02-18 (×2): 400 mg via ORAL
  Filled 2021-02-18 (×7): qty 10

## 2021-02-18 NOTE — Evaluation (Signed)
Occupational Therapy Evaluation Patient Details Name: Vanessa Garrett MRN: [id number] DOB: 30-Jun-1941 Today's Date: 02/18/2021    History of Present Illness Vanessa Garrett is a 81 y.o. 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  Chronic microvascular ischemic changes noted.   Clinical Impression   Vanessa Garrett is an 80 year old woman who demonstrates ability to perform functional mobility and ADLs with use of RW. No physical assistance needed for ADLs or ambulation -- patient demonstrated toileting, standing grooming task and donning socks. Patient and son educated to limit activities to functional tasks and limit exercise until hemoglobin a little higher unless MD instructs otherwise. Patient has to traverse a flight of steps to get to bedroom - therapist recommended staying on first floor if able. Patient has 24/7 supervision at home. Patient has no further OT needs.     Follow Up Recommendations  No OT follow up    Equipment Recommendations  None recommended by OT    Recommendations for Other Services       Precautions / Restrictions Precautions Precautions: Fall Restrictions Weight Bearing Restrictions: No      Mobility Bed Mobility Overal bed mobility: Modified Independent                  Transfers Overall transfer level: Modified independent               General transfer comment: Use of RW in room. Patient reports typically not needing walker and wants to leave walker behind.    Balance Overall balance assessment: Needs assistance Sitting-balance support: No upper extremity  supported Sitting balance-Leahy Scale: Good       Standing balance-Leahy Scale: Fair Standing balance comment: able to take hands off of walker for ADLs                           ADL either performed or assessed with clinical judgement   ADL Overall ADL's : At baseline                                             Vision Patient Visual Report: No change from baseline       Perception     Praxis      Pertinent Vitals/Pain Pain Assessment: No/denies pain     Hand Dominance Right   Extremity/Trunk Assessment Upper Extremity Assessment Upper Extremity Assessment: Overall WFL for tasks assessed   Lower Extremity Assessment Lower Extremity Assessment: Defer to PT evaluation   Cervical / Trunk Assessment Cervical / Trunk Assessment: Normal   Communication Communication Communication: No difficulties   Cognition Arousal/Alertness: Awake/alert Behavior During Therapy: WFL for tasks assessed/performed Overall Cognitive Status: Within Functional Limits for tasks assessed                                     General Comments       Exercises     Shoulder Instructions      Home  Living Family/patient expects to be discharged to:: Private residence Living Arrangements: Children Available Help at Discharge: Family;Available 24 hours/day Type of Home: House       Home Layout: Two level Alternate Level Stairs-Number of Steps: ~ "20" per son who does not live there Alternate Level Stairs-Rails: Right;Left Bathroom Shower/Tub: Walk-in shower         Home Equipment: None          Prior Functioning/Environment Level of Independence: Independent        Comments: does not cook, lives with Chartered loss adjuster, likes to work in yard.        OT Problem List:        OT Treatment/Interventions:      OT Goals(Current goals can be found in the care plan section)    OT Frequency:     Barriers to D/C:             Co-evaluation              AM-PAC OT "6 Clicks" Daily Activity     Outcome Measure Help from another person eating meals?: None Help from another person taking care of personal grooming?: None Help from another person toileting, which includes using toliet, bedpan, or urinal?: None Help from another person bathing (including washing, rinsing, drying)?: None Help from another person to put on and taking off regular upper body clothing?: None Help from another person to put on and taking off regular lower body clothing?: None 6 Click Score: 24   End of Session Equipment Utilized During Treatment: Rolling walker Nurse Communication: Mobility status  Activity Tolerance: Patient tolerated treatment well Patient left: in bed;with call bell/phone within reach;with bed alarm set;with family/visitor present  OT Visit Diagnosis: Muscle weakness (generalized) (M62.81)                Time: 4599-7741 OT Time Calculation (min): 15 min Charges:  OT General Charges $OT Visit: 1 Visit OT Evaluation $OT Eval Low Complexity: 1 Low  Jeneal Vogl, OTR/L [place]  Office [phone] Pager: [phone]   Lenward Chancellor 02/18/2021, 3:38 PM

## 2021-02-18 NOTE — TOC Initial Note (Signed)
Transition of Care [facility]) - Initial/Assessment Note    Patient Details  Name: Vanessa Garrett MRN: [id number] Date of Birth: 1941-05-11  Transition of Care [facility]) CM/SW Contact:    Trish Mage, LCSW Phone Number: 02/18/2021, 3:31 PM  Clinical Narrative:    Patient seen in follow up to PT recommendation of [facility] [facility].  Ms Roepke lives at home with her daughter, has rolling walker, rollator and shower stool.  She would like a bedside commode.  She is unsure of [place] [facility].  She and her family will talk further, and will let me know tomorrow about whether they want to avail themselves to that service or not. TOC will continue to follow during the course of hospitalization.                Expected Discharge Plan: Home/Self Care Barriers to Discharge: No Barriers Identified   Patient Goals and CMS Choice     Choice offered to / list presented to : Patient, Adult Children  Expected Discharge Plan and Services Expected Discharge Plan: Home/Self Care   Discharge Planning Services: CM Consult Post Acute Care Choice: Durable Medical Equipment Living arrangements for the past 2 months: Single Family Home                                      Prior Living Arrangements/Services Living arrangements for the past 2 months: Single Family Home Lives with:: Adult Children Patient language and need for interpreter reviewed:: Yes        Need for Family Participation in Patient Care: Yes (Comment) Care giver support system in place?: Yes (comment) Current home services: DME Criminal Activity/Legal Involvement Pertinent to Current Situation/Hospitalization: No - Comment as needed  Activities of Daily Living Home Assistive Devices/Equipment: Eyeglasses, Environmental consultant (specify type) (walker with a seat) ADL Screening (condition at time of admission) Patient's cognitive ability adequate to safely complete daily activities?: No Is the patient deaf or have difficulty hearing?: Yes Does the patient have  difficulty seeing, even when wearing glasses/contacts?: Yes Does the patient have difficulty concentrating, remembering, or making decisions?: Yes Patient able to express need for assistance with ADLs?: Yes Does the patient have difficulty dressing or bathing?: Yes Independently performs ADLs?: No Communication: Independent Dressing (OT): Needs assistance Is this a change from baseline?: Pre-admission baseline Grooming: Independent Feeding: Independent Bathing: Needs assistance Is this a change from baseline?: Pre-admission baseline Toileting: Needs assistance Is this a change from baseline?: Pre-admission baseline In/Out Bed: Needs assistance Is this a change from baseline?: Pre-admission baseline Walks in Home: Needs assistance Is this a change from baseline?: Pre-admission baseline Does the patient have difficulty walking or climbing stairs?: Yes Weakness of Legs: Both Weakness of Arms/Hands: Both  Permission Sought/Granted Permission sought to share information with : Family Supports Permission granted to share information with : Yes, Verbal Permission Granted  Share Information with NAME: Marene, Gilliam     [phone]           Emotional Assessment Appearance:: Appears stated age Attitude/Demeanor/Rapport: Engaged Affect (typically observed): Appropriate Orientation: : Oriented to Self, Oriented to Place, Oriented to Situation Alcohol / Substance Use: Not Applicable Psych Involvement: No (comment)  Admission diagnosis:  Adenocarcinoma of gallbladder (HCC) [C23] Thrombocytopenia (HCC) [D69.6] Generalized weakness [R53.1] Pancytopenia (HCC) [D61.818] Anemia, unspecified type [D64.9] Neutropenia, unspecified type ([place]) [D70.9] Patient Active Problem List   Diagnosis Date Noted   Malnutrition of moderate  degree 02/18/2021   Pancytopenia ([place]) 02/16/2021   CKD (chronic kidney disease), stage IV ([place]) 02/16/2021   Hyponatremia 02/16/2021   Essential  hypertension 02/16/2021   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   Gallbladder cancer ([place]) 06/21/2020   Goals of care, counseling/discussion 05/28/2020   Liver mass 05/28/2020   Patient is Jehovah's Witness 05/28/2020   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   PCP:  Einar Pheasant, DO Pharmacy:   [facility] [place], [address] [place] [address] Phone: [phone] Fax: [phone]  [id number] - [place], [place] - [address] AT [address] [address] [place] [address] Phone: [phone] Fax: [phone]  [id number] [place], [address] AT [facility] [address] [place] [address] Phone: [phone] Fax: [phone]  [facility] [address] [place] [address] Phone: [phone] Fax: [phone]  [facility] [place] - [place], [address] [place] [address] Phone: [phone] Fax: [phone]     Social Determinants of Health (SDOH) Interventions    Readmission Risk Interventions No flowsheet data found.

## 2021-02-18 NOTE — Progress Notes (Signed)
Ms. Ginsberg was taken out of the ICU.  She now is on 5 E.  She is doing better.  She still is not eating much.  She is on daily Neupogen and Procrit.  Her white cell count is 0.9.  Hemoglobin 6.1.  Platelet count 54,000.  The monocytes are 23%.  Hopefully, her bone marrow is starting to recover little bit.  I do think she got some iron yesterday.  We really have to get her appetite better.  She is on Marinol.  I will add Megace.  I think she is post be on Megace at home.  She really needs to get out of bed.  She needs to get in a chair.  I will know she might benefit from physical therapy.  Vital signs show temperature 98.3.  Pulse 76.  Blood pressure 110/49.  Oxygen saturation is 98%.  Her lungs are clear bilaterally.  Cardiac exam regular rate and rhythm.  Abdomen is soft.  There is no fluid wave.  There is no obvious guarding or rebound tenderness.  Neurological exam is nonfocal.  Ms. Paulo is pancytopenia from the chemotherapy.  I think that with her monocytes coming up, this is a sign that her blood counts should be improving.  We are trying to stimulate her erythropoiesis and myelopoiesis.  Hopefully, we will get her blood counts up a little more quickly.  Again, nutritional status critical.  Her activity level needs to be improved.  Maybe she needs some physical therapy.  I know that she is getting fantastic care from all the staff on 5 E.  I do appreciate all their hard work and compassion.  Lattie Haw, MD  2 Corinthians 12:9-10

## 2021-02-18 NOTE — Progress Notes (Signed)
PROGRESS NOTE    Vanessa Garrett  TWS:[id number] DOB: 21-Mar-1941 DOA: 02/16/2021 PCP: Einar Pheasant, DO   Brief Narrative:   Vanessa Garrett is a 80 y.o. 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  She sees occasional spotting of blood on tissue paper after bowel movements without any other obvious bleeding.  Per family, she was confused earlier but she was fully alert and oriented upon arrival to ED.  Upon arrival to ED, she was hemodynamically stable.   Labs show WBC 1.0, hemoglobin 6.1, platelets 29,000, ANC 500, sodium 129, potassium 3.6, bicarb 23, BUN 28, creatinine 2.14, ammonia 13.Urinalysis negative for UTI.  Blood and urine cultures obtained. SARS-CoV-2 PCR panel negative.  FOBT negative.  Chest x-ray unremarkable.  CT head unremarkable.   Patient was given 500 cc normal saline bolus.  EDP discussed with patient's oncologist, Dr. Marin Olp, who recommended obtaining blood and urine cultures, starting cefepime, and medical admission.  The hospitalist service was consulted to admit for further evaluation and management.  Assessment & Plan:   Principal Problem:   Pancytopenia ([place]) Active Problems:   Gallbladder cancer (HCC)   CKD (chronic kidney disease), stage IV (HCC)   Hyponatremia   Essential hypertension   Malnutrition of moderate degree   Pancytopenia with fatigue/lethargy: Presenting with WBC 1.0, ANC 500, hemoglobin 6.1, platelets 29,000.  Symptoms likely from anemia.  Suspect chemotherapy associated with likely chronic blood loss.   -Patient is a Sales promotion account executive  Witness and will not accept blood transfusion.  Patient was started on antibiotics per oncology recommendation due to having low-grade fever however she has remained afebrile ever since and I personally do not see any indication for continuing antibiotics but since oncology has recommended that so I will continue that for another day and follow cultures and if everything negative and she remains afebrile, I will discontinue antibiotics.  Her hemoglobin as well as white cells have improved a little bit. Oncology is managing that and she is getting daily Neupogen and Procrit.  Dr. Marin Olp has recommended to keep this patient in the hospital until her white cells are over 2.  Acute metabolic encephalopathy: Patient was having altered mental status upon presentation to ED which resolved as soon as hospitalist had seen her.  She is fully alert and oriented today.   CKD stage IV: Near recent baseline.  Continue monitor.   Hyponatremia: Improved,    Generalized weakness: PT OT consulted.  Hypokalemia: Resolved.  Stage IV adenocarcinoma of the gallbladder with liver and lymph node metastases: Oncology on board and management per them.   Hypertension: Blood pressure controlled, continue Lopressor but hold Maxide.  DVT prophylaxis: SCDs Start: 02/16/21 2120   Code Status: Full Code  Family Communication: Son present at bedside.  Plan of care discussed with patient in length and he verbalized understanding and agreed with it.  Status is: Inpatient  Remains inpatient appropriate because:Inpatient level of care appropriate due to severity of illness  Dispo: The patient is from: Home              Anticipated d/c is to: Home  Patient currently is not medically stable to d/c.   Difficult to place patient No        Estimated body mass index is 28.25 kg/m as calculated from the following:   Height as of this encounter: 5\' 1"  (1.549 m).   Weight as of this encounter: 67.8 kg.       Nutritional status:  Nutrition Problem: Moderate Malnutrition Etiology: chronic illness, cancer and cancer related treatments   Signs/Symptoms: moderate fat depletion, mild muscle depletion   Interventions: Refer to RD note for recommendations, MVI    Consultants:  Oncology  Procedures:  None  Antimicrobials:  Anti-infectives (From admission, onward)    Start     Dose/Rate Route Frequency Ordered Stop   02/17/21 2200  ceFEPIme (MAXIPIME) 2 g in sodium chloride 0.9 % 100 mL IVPB        2 g 200 mL/hr over 30 Minutes Intravenous Every 24 hours 02/16/21 2058     02/16/21 2145  ceFEPIme (MAXIPIME) 2 g in sodium chloride 0.9 % 100 mL IVPB        2 g 200 mL/hr over 30 Minutes Intravenous STAT 02/16/21 2054 02/16/21 2209          Subjective: Seen and examined.  Son at the bedside.  Patient has no complaints other than just some mild weakness.  She is fully alert and oriented.  Objective: Vitals:   02/17/21 1300 02/17/21 1415 02/17/21 1959 02/18/21 0609  BP: (!) 145/56 (!) 133/122 (!) 133/59 (!) 110/49  Pulse: 73 74 78 76  Resp: 17 18 19 18   Temp:  98.6 F (37 C) 98 F (36.7 C) 98.3 F (36.8 C)  TempSrc:      SpO2: 98% 98% 97% 98%  Weight:      Height:        Intake/Output Summary (Last 24 hours) at 02/18/2021 1105 Last data filed at 02/18/2021 0600 Gross per 24 hour  Intake 220 ml  Output 350 ml  Net -130 ml    Filed Weights   02/16/21 1244 02/16/21 1535  Weight: 64.4 kg 67.8 kg    Examination: General exam: Appears calm and comfortable  Respiratory system: Clear to auscultation. Respiratory effort normal. Cardiovascular system: S1 & S2 heard, RRR. No JVD, murmurs, rubs, gallops or clicks. No pedal edema. Gastrointestinal system: Abdomen is nondistended, soft and nontender. No organomegaly or masses felt. Normal bowel sounds heard. Central nervous system: Alert and oriented. No focal neurological deficits. Extremities: Symmetric 5 x 5 power. Skin:  No rashes, lesions or ulcers.  Psychiatry: Judgement and insight appear normal. Mood & affect appropriate.   Data Reviewed: I have personally reviewed following labs and imaging studies  CBC: Recent Labs  Lab 02/16/21 1406 02/17/21 0233 02/18/21 0458  WBC 1.0* 0.8* 0.9*  NEUTROABS 0.5* 0.3* 0.3*  HGB 6.1* 5.8* 6.1*  HCT 18.4* 19.6* 19.4*  MCV 92.9 104.8* 97.5  PLT 29* 40* 54*    Basic Metabolic Panel: Recent Labs  Lab 02/16/21 1330 02/17/21 0233 02/18/21 0458  NA 129* 133* 136  K 3.6 3.4* 3.5  CL 97* 101 105  CO2 23 22 25   GLUCOSE 126* 94 92  BUN 28* 28* 28*  CREATININE 2.14* 1.92* 1.67*  CALCIUM 8.5* 8.1* 8.6*    GFR: Estimated Creatinine Clearance: 23.7 mL/min (A) (by C-G formula based on SCr of 1.67 mg/dL (H)). Liver Function Tests: Recent Labs  Lab 02/16/21 1330  AST 19  ALT 18  ALKPHOS 104  BILITOT 0.4  PROT 6.4*  ALBUMIN 3.5    Recent Labs  Lab 02/16/21 1330  LIPASE 21    Recent Labs  Lab 02/16/21 1406  AMMONIA 13    Coagulation Profile: No results for input(s): INR, PROTIME in the last 168 hours. Cardiac Enzymes: No results for input(s): CKTOTAL, CKMB, CKMBINDEX, TROPONINI in the last 168 hours. BNP (last 3 results) No results for input(s): PROBNP in the last 8760 hours. HbA1C: No results for input(s): HGBA1C in the last 72 hours. CBG: No results for input(s): GLUCAP in the last 168 hours. Lipid Profile: No results for input(s): CHOL, HDL, LDLCALC, TRIG, CHOLHDL, LDLDIRECT in the last 72 hours. Thyroid Function Tests: No results for input(s): TSH, T4TOTAL, FREET4, T3FREE, THYROIDAB in the last 72 hours. Anemia Panel: Recent Labs    02/17/21 0233  VITAMINB12 406  FOLATE 49.9  FERRITIN 1,731*  TIBC 212*  IRON 45  RETICCTPCT 1.4    Sepsis Labs: No results for input(s): PROCALCITON, LATICACIDVEN in the last 168 hours.  Recent Results (from the past 240 hour(s))  Resp Panel by RT-PCR (Flu A&B, Covid) Nasopharyngeal Swab      Status: None   Collection Time: 02/16/21  1:30 PM   Specimen: Nasopharyngeal Swab; Nasopharyngeal(NP) swabs in vial transport medium  Result Value Ref Range Status   SARS Coronavirus 2 by RT PCR NEGATIVE NEGATIVE Final    Comment: (NOTE) SARS-CoV-2 target nucleic acids are NOT DETECTED.  The SARS-CoV-2 RNA is generally detectable in upper respiratory specimens during the acute phase of infection. The lowest concentration of SARS-CoV-2 viral copies this assay can detect is 138 copies/mL. A negative result does not preclude SARS-Cov-2 infection and should not be used as the sole basis for treatment or other patient management decisions. A negative result may occur with  improper specimen collection/handling, submission of specimen other than nasopharyngeal swab, presence of viral mutation(s) within the areas targeted by this assay, and inadequate number of viral copies(<138 copies/mL). A negative result must be combined with clinical observations, patient history, and epidemiological information. The expected result is Negative.  Fact Sheet for Patients:  EntrepreneurPulse.com.au  Fact Sheet for Healthcare Providers:  IncredibleEmployment.be  This test is no t yet approved or cleared by the [place] FDA and  has been authorized for detection and/or diagnosis of SARS-CoV-2 by FDA under an Emergency Use Authorization (EUA). This EUA will remain  in effect (meaning this test can be used) for the duration of the COVID-19 declaration under Section 564(b)(1) of the Act, 21 U.S.C.section 360bbb-3(b)(1), unless the authorization is terminated  or revoked sooner.       Influenza A by PCR NEGATIVE NEGATIVE Final   Influenza B by PCR NEGATIVE NEGATIVE Final    Comment: (NOTE) The Xpert Xpress SARS-CoV-2/FLU/RSV plus assay is intended as an aid in the diagnosis of influenza from Nasopharyngeal swab specimens and should not be used as a sole basis  for treatment. Nasal washings and aspirates are unacceptable for Xpert Xpress SARS-CoV-2/FLU/RSV testing.  Fact Sheet for Patients: EntrepreneurPulse.com.au  Fact Sheet for Healthcare Providers: IncredibleEmployment.be  This test is not yet approved or cleared by the [place] FDA and has been authorized for detection and/or diagnosis of SARS-CoV-2 by FDA under an Emergency Use Authorization (EUA). This EUA will remain in effect (meaning this test can be used) for the duration of the COVID-19 declaration under Section 564(b)(1) of the Act, 21 U.S.C. section 360bbb-3(b)(1), unless the authorization is terminated or revoked.  Performed at [facility],  [place], [place] [address]   Culture, blood (routine x 2)     Status: None (Preliminary result)   Collection Time: 02/16/21  4:20 PM   Specimen: BLOOD  Result Value Ref Range Status   Specimen Description   Final    BLOOD LEFT ANTECUBITAL Performed at [facility], [address] [place], [place] [address]    Special Requests   Final    BOTTLES DRAWN AEROBIC ONLY Blood Culture adequate volume Performed at [facility], [address] [place], [place] [address]    Culture   Final    NO GROWTH < 24 HOURS Performed at [facility], [place] [address] [place], [place] [address]    Report Status PENDING  Incomplete  Urine culture     Status: None   Collection Time: 02/16/21  4:25 PM   Specimen: Urine, Random  Result Value Ref Range Status   Specimen Description   Final    URINE, RANDOM Performed at [facility], [address] [place], [place] [address]    Special Requests   Final    NONE Performed at [facility], [address] [place], [place] [address]    Culture   Final    NO GROWTH Performed at [facility], [place] [address] [place], [place] [address]    Report Status 02/18/2021 FINAL  Final   Culture, blood (routine x 2)     Status: None (Preliminary result)   Collection Time: 02/16/21  4:30 PM   Specimen: BLOOD  Result Value Ref Range Status   Specimen Description   Final    BLOOD RIGHT ANTECUBITAL Performed at [facility], [address] [place], [place] [address]    Special Requests   Final    BOTTLES DRAWN AEROBIC ONLY Blood Culture results may not be optimal due to an inadequate volume of blood received in culture bottles Performed at [facility], [address] [place], [place] [address]    Culture   Final    NO GROWTH < 24 HOURS Performed at [facility], [place] [address], [place], [place] [address]    Report Status PENDING  Incomplete  MRSA PCR Screening     Status: None   Collection Time: 02/16/21  8:00 PM  Result Value Ref Range Status   MRSA by PCR NEGATIVE NEGATIVE Final    Comment:        The GeneXpert MRSA Assay (FDA approved for NASAL specimens only), is one component of a comprehensive MRSA colonization surveillance program. It is not intended to diagnose MRSA infection nor to guide or monitor treatment for MRSA infections. Performed at [facility], [place] [address] [place],  [address]        Radiology Studies: CT Head Wo Contrast  Result Date: 02/16/2021 CLINICAL DATA:  Mental status change EXAM: CT HEAD WITHOUT CONTRAST TECHNIQUE: Contiguous axial images were obtained from the base of the skull through the vertex without intravenous contrast. COMPARISON:  None. FINDINGS: Brain: There is no acute intracranial hemorrhage, mass effect, or edema. Gray-white differentiation is preserved. There is no extra-axial fluid collection. Patchy and confluent areas of low-attenuation in the supratentorial white matter are nonspecific but may reflect moderate chronic microvascular ischemic changes. Prominence of the ventricles and sulci reflects generalized parenchymal volume loss. Vascular: There is  atherosclerotic calcification at the skull base.  Skull: Calvarium is unremarkable. Sinuses/Orbits: Some frothy retained secretions in the left sphenoid sinus. No acute abnormality of the orbits. Other: None. IMPRESSION: No acute intracranial abnormality. Chronic microvascular ischemic changes. Electronically Signed   By: Macy Mis M.D.   On: 02/16/2021 14:10   DG Chest Port 1 View  Result Date: 02/16/2021 CLINICAL DATA:  Weakness. EXAM: PORTABLE CHEST 1 VIEW COMPARISON:  No prior. FINDINGS: PowerPort catheter with tip over SVC. Heart size normal. No pulmonary venous congestion. No focal infiltrate. No pleural effusion or pneumothorax. IMPRESSION: PowerPort catheter noted with tip over SVC. No acute cardiopulmonary disease. Electronically Signed   By: Marcello Moores  Register   On: 02/16/2021 13:36    Scheduled Meds:  Chlorhexidine Gluconate Cloth  6 each Topical Q0600   dronabinol  2.5 mg Oral BID AC   epoetin alfa  40,000 Units Subcutaneous QPC breakfast   megestrol  400 mg Oral BID   metoprolol tartrate  25 mg Oral BID   multivitamin with minerals  1 tablet Oral Daily   sodium chloride flush  3 mL Intravenous Q12H   Tbo-Filgrastim  480 mcg Subcutaneous q1800   Continuous Infusions:  ceFEPime (MAXIPIME) IV 2 g (02/17/21 2236)     LOS: 2 days   Time spent: 30 minutes   Darliss Cheney, MD [facility]  02/18/2021, 11:05 AM   How to contact the [facility] Attending or Consulting provider [place] or covering provider during after hours [place], for this patient?  Check the care team in [facility] and look for a) attending/consulting TRH provider listed and b) the [facility] team listed. Page or secure chat [phone]. Log into [email or web address] and use [place]'s universal password to access. If you do not have the password, please contact the hospital operator. Locate the [facility] provider you are looking for under [facility] and page to a number that you can be directly reached. If you still have difficulty  reaching the provider, please page the [facility] (Director on Call) for the Hospitalists listed on amion for assistance.

## 2021-02-18 NOTE — Progress Notes (Signed)
Patient's daughter, [name], calling for an update to her mom's admission. Reviewed Dr Antonieta Pert progress note from this morning with her. She was appreciative of the update.  Oncology Nurse Navigator Documentation  Oncology Nurse Navigator Flowsheets 02/18/2021  Abnormal Finding Date -  Planned Course of Treatment -  Phase of Treatment -  Chemotherapy Actual Start Date: -  Navigator Follow Up Date: 02/22/2021  Navigator Follow Up Reason: Appointment Review  Navigator Location [facility]  Referral Date to RadOnc/MedOnc -  Navigator Encounter Type Telephone  Telephone Education  Treatment Initiated Date -  Patient Visit Type MedOnc  Treatment Phase Active Tx  Barriers/Navigation Needs Coordination of Care;Education  Education Other  Interventions Education;Psycho-Social Support  Acuity Level 2-Minimal Needs (1-2 Barriers Identified)  Referrals -  Coordination of Care -  Education Method Verbal  Support Groups/Services Friends and Family  Time Spent with Patient 15

## 2021-02-19 LAB — CBC WITH DIFFERENTIAL/PLATELET
Band Neutrophils: 12 %
Basophils Relative: 0 %
Blasts: 0 %
Eosinophils Relative: 2 %
HCT: 17.9 % — ABNORMAL LOW (ref 36.0–46.0)
Hemoglobin: 5.6 g/dL — CL (ref 12.0–15.0)
Lymphocytes Relative: 22 %
MCH: 30.8 pg (ref 26.0–34.0)
MCHC: 31.3 g/dL (ref 30.0–36.0)
MCV: 98.4 fL (ref 80.0–100.0)
Metamyelocytes Relative: 0 %
Monocytes Relative: 22 %
Myelocytes: 0 %
Neutrophils Relative %: 42 %
Platelets: 65 10*3/uL — ABNORMAL LOW (ref 150–400)
Promyelocytes Relative: 0 %
RBC Morphology: NORMAL
RBC: 1.82 MIL/uL — ABNORMAL LOW (ref 3.87–5.11)
RDW: 19.6 % — ABNORMAL HIGH (ref 11.5–15.5)
WBC: 1.8 10*3/uL — ABNORMAL LOW (ref 4.0–10.5)
nRBC: 10 /100 WBC — ABNORMAL HIGH
nRBC: 2.2 % — ABNORMAL HIGH (ref 0.0–0.2)

## 2021-02-19 LAB — BASIC METABOLIC PANEL
Anion gap: 5 (ref 5–15)
BUN: 23 mg/dL (ref 8–23)
CO2: 24 mmol/L (ref 22–32)
Calcium: 8.4 mg/dL — ABNORMAL LOW (ref 8.9–10.3)
Chloride: 104 mmol/L (ref 98–111)
Creatinine, Ser: 1.71 mg/dL — ABNORMAL HIGH (ref 0.44–1.00)
GFR, Estimated: 30 mL/min — ABNORMAL LOW (ref >60)
Glucose, Bld: 108 mg/dL — ABNORMAL HIGH (ref 70–99)
Potassium: 3.6 mmol/L (ref 3.5–5.1)
Sodium: 133 mmol/L — ABNORMAL LOW (ref 135–145)

## 2021-02-19 NOTE — TOC Progression Note (Signed)
Transition of Care [facility]) - Progression Note    Patient Details  Name: Vanessa Garrett MRN: [id number] Date of Birth: 03/12/1941  Transition [facility]) CM/SW [place], [place] Phone Number: 02/19/2021, 12:21 PM  Clinical Narrative:   Followed up with patient today re: [facility] services.  Her daughter and granddaughter were visiting. Patient declined [facility] services, and daughter confirms they do not feel they need that service.  Daughter also asked me to take back the bedside commode as "we do not have enough room in her bedroom for that."  Patient deferred to daughter.  [place] and asked them to pick up [facility]. TOC will continue to follow during the course of hospitalization.     Expected Discharge Plan: Home/Self Care Barriers to Discharge: No Barriers Identified  Expected Discharge Plan and Services Expected Discharge Plan: Home/Self Care   Discharge Planning Services: CM Consult Post Acute Care Choice: Durable Medical Equipment Living arrangements for the past 2 months: Single Family Home                                       Social Determinants of Health (SDOH) Interventions    Readmission Risk Interventions No flowsheet data found.

## 2021-02-19 NOTE — Progress Notes (Signed)
PROGRESS NOTE    Vanessa Garrett  FYB:[id number] DOB: 1941/06/03 DOA: 02/16/2021 PCP: Einar Pheasant, DO   Brief Narrative:   Vanessa Garrett is a 80 y.o. 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  She sees occasional spotting of blood on tissue paper after bowel movements without any other obvious bleeding.  Per family, she was confused earlier but she was fully alert and oriented upon arrival to ED.  Upon arrival to ED, she was hemodynamically stable.   Labs show WBC 1.0, hemoglobin 6.1, platelets 29,000, ANC 500, sodium 129, potassium 3.6, bicarb 23, BUN 28, creatinine 2.14, ammonia 13.Urinalysis negative for UTI.  Blood and urine cultures obtained. SARS-CoV-2 PCR panel negative.  FOBT negative.  Chest x-ray unremarkable.  CT head unremarkable.   Patient was given 500 cc normal saline bolus.  EDP discussed with patient's oncologist, Dr. Marin Olp, who recommended obtaining blood and urine cultures, starting cefepime, and medical admission.  The hospitalist service was consulted to admit for further evaluation and management.  Assessment & Plan:   Principal Problem:   Pancytopenia ([place]) Active Problems:   Gallbladder cancer (HCC)   CKD (chronic kidney disease), stage IV (HCC)   Hyponatremia   Essential hypertension   Malnutrition of moderate degree   Pancytopenia with fatigue/lethargy: Presenting with WBC 1.0, ANC 500, hemoglobin 6.1, platelets 29,000.  Symptoms likely from anemia.  Suspect chemotherapy associated with likely chronic blood loss.   -Patient is a Sales promotion account executive  Witness and will not accept blood transfusion.  Patient was started on antibiotics per oncology recommendation due to having low-grade fever however she has remained afebrile ever since and I personally do not see any indication for continuing antibiotics and has, I will discontinue antibiotics.  Slight drop in hemoglobin to 5.6 but white cells improved to 1.8 as well as platelets improved as well.  Oncology is managing that and she is getting daily Neupogen and Procrit.  Dr. Marin Olp has recommended to keep this patient in the hospital until her white cells are over 2.  Acute metabolic encephalopathy: Patient was having altered mental status upon presentation to ED which resolved as soon as hospitalist had seen her.  She is fully alert and oriented today.   CKD stage IV: Near recent baseline.  Continue monitor.   Hyponatremia: Improved,    Generalized weakness: PT OT consulted.  They recommend home with home health  Hypokalemia: Resolved.  Stage IV adenocarcinoma of the gallbladder with liver and lymph node metastases: Oncology on board and management per them.   Hypertension: Blood pressure controlled, continue Lopressor but hold Maxide.  DVT prophylaxis: SCDs Start: 02/16/21 2120   Code Status: Full Code  Family Communication: Son present at bedside.  Plan of care discussed with patient in length and he verbalized understanding and agreed with it.  Status is: Inpatient  Remains inpatient appropriate because:Inpatient level of care appropriate due to severity of illness  Dispo: The patient is from: Home              Anticipated d/c is to: Home              Patient currently  is not medically stable to d/c.   Difficult to place patient No        Estimated body mass index is 28.25 kg/m as calculated from the following:   Height as of this encounter: 5\' 1"  (1.549 m).   Weight as of this encounter: 67.8 kg.      Nutritional status:  Nutrition Problem: Moderate  Malnutrition Etiology: chronic illness, cancer and cancer related treatments   Signs/Symptoms: moderate fat depletion, mild muscle depletion   Interventions: Refer to RD note for recommendations, MVI    Consultants:  Oncology  Procedures:  None  Antimicrobials:  Anti-infectives (From admission, onward)    Start     Dose/Rate Route Frequency Ordered Stop   02/17/21 2200  ceFEPIme (MAXIPIME) 2 g in sodium chloride 0.9 % 100 mL IVPB  Status:  Discontinued        2 g 200 mL/hr over 30 Minutes Intravenous Every 24 hours 02/16/21 2058 02/19/21 0810   02/16/21 2145  ceFEPIme (MAXIPIME) 2 g in sodium chloride 0.9 % 100 mL IVPB        2 g 200 mL/hr over 30 Minutes Intravenous STAT 02/16/21 2054 02/16/21 2209          Subjective: Seen and examined.  Daughter is at bedside.  Patient has no complaints.  she wants to go home.  Objective: Vitals:   02/18/21 0609 02/18/21 1334 02/18/21 2027 02/19/21 0415  BP: (!) 110/49 (!) 141/75 117/61 131/63  Pulse: 76 74 77 66  Resp: 18 18 20 15   Temp: 98.3 F (36.8 C) (!) 97.5 F (36.4 C) 98.1 F (36.7 C) 97.7 F (36.5 C)  TempSrc:  Oral Oral Oral  SpO2: 98% 100% 99% 99%  Weight:      Height:        Intake/Output Summary (Last 24 hours) at 02/19/2021 1153 Last data filed at 02/19/2021 0100 Gross per 24 hour  Intake 220 ml  Output --  Net 220 ml    Filed Weights   02/16/21 1244 02/16/21 1535  Weight: 64.4 kg 67.8 kg    Examination: General exam: Appears calm and comfortable  Respiratory system: Clear to auscultation. Respiratory effort normal. Cardiovascular system: S1 & S2 heard, RRR. No JVD, murmurs, rubs, gallops or clicks. No pedal edema. Gastrointestinal system: Abdomen is nondistended, soft and nontender. No organomegaly or masses felt. Normal bowel sounds heard. Central nervous system: Alert and oriented. No focal neurological deficits. Extremities: Symmetric 5 x 5 power. Skin: No rashes, lesions or ulcers.   Psychiatry: Judgement and insight appear normal. Mood & affect appropriate.   Data Reviewed: I have personally reviewed following labs and imaging studies  CBC: Recent Labs  Lab 02/16/21 1406 02/17/21 0233 02/18/21 0458 02/19/21 0409  WBC 1.0* 0.8* 0.9* 1.8*  NEUTROABS 0.5* 0.3* 0.3*  --   HGB 6.1* 5.8* 6.1* 5.6*  HCT 18.4* 19.6* 19.4* 17.9*  MCV 92.9 104.8* 97.5 98.4  PLT 29* 40* 54* 65*    Basic Metabolic Panel: Recent Labs  Lab 02/16/21 1330 02/17/21 0233 02/18/21 0458 02/19/21 0409  NA 129* 133* 136 133*  K 3.6 3.4* 3.5 3.6  CL 97* 101 105 104  CO2 23 22 25 24   GLUCOSE 126* 94 92 108*  BUN 28* 28* 28* 23  CREATININE 2.14* 1.92* 1.67* 1.71*  CALCIUM 8.5* 8.1* 8.6* 8.4*    GFR: Estimated Creatinine Clearance: 23.1 mL/min (A) (by C-G formula based on SCr of 1.71 mg/dL (H)). Liver Function Tests: Recent Labs  Lab 02/16/21 1330  AST 19  ALT 18  ALKPHOS 104  BILITOT 0.4  PROT 6.4*  ALBUMIN 3.5    Recent Labs  Lab 02/16/21 1330  LIPASE 21    Recent Labs  Lab 02/16/21 1406  AMMONIA 13    Coagulation Profile: No results for input(s): INR, PROTIME in the last 168 hours. Cardiac Enzymes: No results for input(s): CKTOTAL, CKMB, CKMBINDEX, TROPONINI in the last 168 hours. BNP (last 3 results) No results for input(s): PROBNP in the last 8760 hours. HbA1C: No results for input(s): HGBA1C in the last 72 hours. CBG: No results for input(s): GLUCAP in the last 168 hours. Lipid Profile: No results for input(s): CHOL, HDL, LDLCALC, TRIG, CHOLHDL, LDLDIRECT in the last 72 hours. Thyroid Function Tests: No results for input(s): TSH, T4TOTAL, FREET4, T3FREE, THYROIDAB in the last 72 hours. Anemia Panel: Recent Labs    02/17/21 0233  VITAMINB12 406  FOLATE 49.9  FERRITIN 1,731*  TIBC 212*  IRON 45  RETICCTPCT 1.4    Sepsis Labs: No results for input(s): PROCALCITON, LATICACIDVEN in the last 168 hours.  Recent Results (from the past 240 hour(s))   Resp Panel by RT-PCR (Flu A&B, Covid) Nasopharyngeal Swab     Status: None   Collection Time: 02/16/21  1:30 PM   Specimen: Nasopharyngeal Swab; Nasopharyngeal(NP) swabs in vial transport medium  Result Value Ref Range Status   SARS Coronavirus 2 by RT PCR NEGATIVE NEGATIVE Final    Comment: (NOTE) SARS-CoV-2 target nucleic acids are NOT DETECTED.  The SARS-CoV-2 RNA is generally detectable in upper respiratory specimens during the acute phase of infection. The lowest concentration of SARS-CoV-2 viral copies this assay can detect is 138 copies/mL. A negative result does not preclude SARS-Cov-2 infection and should not be used as the sole basis for treatment or other patient management decisions. A negative result may occur with  improper specimen collection/handling, submission of specimen other than nasopharyngeal swab, presence of viral mutation(s) within the areas targeted by this assay, and inadequate number of viral copies(<138 copies/mL). A negative result must be combined with clinical observations, patient history, and epidemiological information. The expected result is Negative.  Fact Sheet for Patients:  EntrepreneurPulse.com.au  Fact Sheet for Healthcare Providers:  IncredibleEmployment.be  This test is no t yet approved or cleared by the [place] FDA and  has been authorized for detection and/or diagnosis of SARS-CoV-2 by FDA under an Emergency Use Authorization (EUA). This EUA will remain  in effect (meaning this test can be used) for the duration of the COVID-19 declaration under Section 564(b)(1) of the Act, 21 U.S.C.section 360bbb-3(b)(1), unless the authorization is terminated  or revoked sooner.       Influenza A by PCR NEGATIVE NEGATIVE Final   Influenza B by PCR NEGATIVE NEGATIVE Final    Comment: (NOTE) The Xpert Xpress SARS-CoV-2/FLU/RSV plus assay is intended as an aid in the diagnosis of influenza from  Nasopharyngeal swab specimens and should not be used as a sole basis for treatment. Nasal washings and aspirates are unacceptable for Xpert Xpress SARS-CoV-2/FLU/RSV testing.  Fact Sheet for Patients: EntrepreneurPulse.com.au  Fact Sheet for Healthcare Providers: IncredibleEmployment.be  This test is not yet approved or cleared by the [place] FDA and has been authorized for detection and/or diagnosis of SARS-CoV-2 by FDA under an Emergency Use Authorization (EUA). This EUA will remain in effect (meaning this test can be used) for the duration of the COVID-19 declaration under Section 564(b)(1) of the Act, 21 U.S.C.  section 360bbb-3(b)(1), unless the authorization is terminated or revoked.  Performed at [facility], [address] [place], [place] [address]   Culture, blood (routine x 2)     Status: None (Preliminary result)   Collection Time: 02/16/21  4:20 PM   Specimen: BLOOD  Result Value Ref Range Status   Specimen Description   Final    BLOOD LEFT ANTECUBITAL Performed at [facility], [address] [place], [place] [address]    Special Requests   Final    BOTTLES DRAWN AEROBIC ONLY Blood Culture adequate volume Performed at [facility], [address] [place], [place] [address]    Culture   Final    NO GROWTH 3 DAYS Performed at [facility], [place] [address], [place], [place] [address]    Report Status PENDING  Incomplete  Urine culture     Status: None   Collection Time: 02/16/21  4:25 PM   Specimen: Urine, Random  Result Value Ref Range Status   Specimen Description   Final    URINE, RANDOM Performed at [facility], [address] [place], [place] [address]    Special Requests   Final    NONE Performed at [facility], [address] [place], [place] [address]    Culture   Final    NO GROWTH Performed at [facility], [place] [address]  [place], [place] [address]    Report Status 02/18/2021 FINAL  Final  Culture, blood (routine x 2)     Status: None (Preliminary result)   Collection Time: 02/16/21  4:30 PM   Specimen: BLOOD  Result Value Ref Range Status   Specimen Description   Final    BLOOD RIGHT ANTECUBITAL Performed at [facility], [address] [place], [place] [address]    Special Requests   Final    BOTTLES DRAWN AEROBIC ONLY Blood Culture results may not be optimal due to an inadequate volume of blood received in culture bottles Performed at [facility], [address] [place], [place] [address]    Culture   Final    NO GROWTH 3 DAYS Performed at [facility], [place] [address] [place], [place] [address]    Report Status PENDING  Incomplete  MRSA PCR Screening     Status: None   Collection Time: 02/16/21  8:00 PM  Result Value Ref Range Status   MRSA by PCR NEGATIVE NEGATIVE Final    Comment:        The GeneXpert MRSA Assay (FDA approved for NASAL specimens only), is one component of a comprehensive MRSA colonization surveillance program. It is not intended to diagnose MRSA infection nor to guide or monitor treatment for MRSA infections. Performed at [facility], [place] [address], [place], [place] [address]        Radiology Studies: No results found.  Scheduled Meds:  Chlorhexidine Gluconate Cloth  6 each Topical Q0600   dronabinol  2.5 mg Oral BID AC   epoetin alfa  40,000 Units Subcutaneous QPC breakfast   megestrol  400 mg Oral BID   metoprolol tartrate  25 mg Oral BID   multivitamin with minerals  1 tablet Oral Daily   sodium chloride flush  3 mL Intravenous Q12H   Tbo-Filgrastim  480 mcg Subcutaneous q1800   Continuous Infusions:     LOS: 3 days  Time spent: 28 minutes   Darliss Cheney, MD [facility]  02/19/2021, 11:53 AM   How to contact the [facility] Attending or Consulting provider [place] or covering provider during after  hours [place], for this patient?  Check the care team in [facility] and look for a) attending/consulting TR[facility] provider listed and b) the [facility] team listed. Page or secure chat [phone]. Log into [email or web address] and use [place]'s universal password to access. If you do not have the password, please contact the hospital operator. Locate the [facility] provider you are looking for under [facility] and page to a number that you can be directly reached. If you still have difficulty reaching the provider, please page the [facility] (Director on Call) for the Hospitalists listed on amion for assistance.

## 2021-02-20 LAB — CBC WITH DIFFERENTIAL/PLATELET
Abs Immature Granulocytes: 0.25 10*3/uL — ABNORMAL HIGH (ref 0.00–0.07)
Basophils Absolute: 0 10*3/uL (ref 0.0–0.1)
Basophils Relative: 1 %
Eosinophils Absolute: 0.1 10*3/uL (ref 0.0–0.5)
Eosinophils Relative: 3 %
HCT: 18.8 % — ABNORMAL LOW (ref 36.0–46.0)
Hemoglobin: 5.8 g/dL — CL (ref 12.0–15.0)
Immature Granulocytes: 7 %
Lymphocytes Relative: 20 %
Lymphs Abs: 0.8 10*3/uL (ref 0.7–4.0)
MCH: 31.2 pg (ref 26.0–34.0)
MCHC: 30.9 g/dL (ref 30.0–36.0)
MCV: 101.1 fL — ABNORMAL HIGH (ref 80.0–100.0)
Monocytes Absolute: 0.7 10*3/uL (ref 0.1–1.0)
Monocytes Relative: 18 %
Neutro Abs: 2 10*3/uL (ref 1.7–7.7)
Neutrophils Relative %: 51 %
Platelets: 86 10*3/uL — ABNORMAL LOW (ref 150–400)
RBC: 1.86 MIL/uL — ABNORMAL LOW (ref 3.87–5.11)
RDW: 20 % — ABNORMAL HIGH (ref 11.5–15.5)
WBC: 3.8 10*3/uL — ABNORMAL LOW (ref 4.0–10.5)
nRBC: 2.4 % — ABNORMAL HIGH (ref 0.0–0.2)

## 2021-02-20 NOTE — Progress Notes (Signed)
PROGRESS NOTE    Vanessa Garrett  MVH:[id number] DOB: 11/27/1940 DOA: 02/16/2021 PCP: Einar Pheasant, DO   Brief Narrative:   Vanessa Garrett is a 80 y.o. 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  She sees occasional spotting of blood on tissue paper after bowel movements without any other obvious bleeding.  Per family, she was confused earlier but she was fully alert and oriented upon arrival to ED.  Upon arrival to ED, she was hemodynamically stable.   Labs show WBC 1.0, hemoglobin 6.1, platelets 29,000, ANC 500, sodium 129, potassium 3.6, bicarb 23, BUN 28, creatinine 2.14, ammonia 13.Urinalysis negative for UTI.  Blood and urine cultures obtained. SARS-CoV-2 PCR panel negative.  FOBT negative.  Chest x-ray unremarkable.  CT head unremarkable.   Patient was given 500 cc normal saline bolus.  EDP discussed with patient's oncologist, Dr. Marin Olp, who recommended obtaining blood and urine cultures, starting cefepime, and medical admission.  The hospitalist service was consulted to admit for further evaluation and management.  Assessment & Plan:   Principal Problem:   Pancytopenia ([place]) Active Problems:   Gallbladder cancer (HCC)   CKD (chronic kidney disease), stage IV (HCC)   Hyponatremia   Essential hypertension   Malnutrition of moderate degree   Pancytopenia with fatigue/lethargy: Presented with WBC 1.0, ANC 500, hemoglobin 6.1, platelets 29,000.  Symptoms likely from anemia.  Suspect chemotherapy associated with likely chronic blood loss.   -Patient is a Sales promotion account executive  Witness and will not accept blood transfusion.  Patient was started on antibiotics per oncology recommendation due to having low-grade fever however she had remained afebrile ever since so antibiotics were discontinued on 02/19/2021.  Hemoglobin slightly better than yesterday, 5.6 today.  White cells improved significantly to 3.8.  Platelets also improved.  Per my conversation with Dr. Marin Olp, he is only criteria to keep patient in the hospital was white cells less than 2.  He had mentioned that patient can be discharged if white cells are more than 2 which they are today.  There was no recommendation provided about any cutoff for hemoglobin.  Based off of the recommendation, I was planning to discharge her home however patient's son is extremely resistant to that decision due to no improvement in hemoglobin.  He also believes that the patient is physically weak.  He was reassured that patient had been evaluated by PT and they had recommended home health which will be arranged for her.  Lengthy discussion took place between myself and the son but he absolutely would not take her home even if I consulted with on-call oncologist and they also agreed for discharge.  Based off of that, we are going to cancel discharge, will let Dr. Marin Olp see this patient tomorrow morning and discussed with them.  Acute metabolic encephalopathy: Patient was having altered mental status upon presentation to ED which resolved as soon as hospitalist had seen her.  She is fully alert and oriented today.   CKD stage IV: Near recent baseline.  Continue monitor.   Hyponatremia: Improved,    Generalized weakness: PT OT consulted.  They recommend home with home health  Hypokalemia: Resolved.  Stage IV adenocarcinoma of the gallbladder with liver and lymph node metastases: Oncology on board and management per them.   Hypertension: Blood pressure controlled, continue Lopressor but hold Maxide.  DVT prophylaxis: SCDs Start:  02/16/21 2120   Code Status: Full Code  Family Communication: Son present at bedside.  Plan of care discussed with patient in length and he verbalized understanding and agreed with it.  Status is: Inpatient  Remains inpatient appropriate because:Inpatient level of care appropriate due to severity of illness  Dispo: The patient is from: Home              Anticipated d/c is to: Home              Patient currently is not medically stable to d/c.   Difficult to place patient No        Estimated body mass index is 28.25 kg/m as calculated from the following:   Height as of this encounter: 5\' 1"  (1.549 m).   Weight as of this encounter: 67.8 kg.      Nutritional status:  Nutrition Problem: Moderate Malnutrition Etiology: chronic illness, cancer and cancer related treatments   Signs/Symptoms: moderate fat depletion, mild muscle depletion   Interventions: Refer to RD note for recommendations, MVI    Consultants:  Oncology  Procedures:  None  Antimicrobials:  Anti-infectives (From admission, onward)    Start     Dose/Rate Route Frequency Ordered Stop   02/17/21 2200  ceFEPIme (MAXIPIME) 2 g in sodium chloride 0.9 % 100 mL IVPB  Status:  Discontinued        2 g 200 mL/hr over 30 Minutes Intravenous Every 24 hours 02/16/21 2058 02/19/21 0810   02/16/21 2145  ceFEPIme (MAXIPIME) 2 g in sodium chloride 0.9 % 100 mL IVPB        2 g 200 mL/hr over 30 Minutes Intravenous STAT 02/16/21 2054 02/16/21 2209          Subjective: Seen and examined.  Patient alert and oriented and she had no complaint.  Son at the bedside.  Details of discussion as mentioned above.  Objective: Vitals:   02/19/21 1411 02/19/21 2014 02/20/21 0444 02/20/21 0854  BP: 126/76 129/66 (!) 120/49 (!) 145/71  Pulse: 70 77 72 75  Resp: 18 16 20    Temp: 97.7 F (36.5 C) 98.2 F (36.8 C) 98.5 F (36.9 C)   TempSrc:  Oral Oral   SpO2: 98% 97% 97%   Weight:      Height:        Intake/Output  Summary (Last 24 hours) at 02/20/2021 1333 Last data filed at 02/20/2021 0626 Gross per 24 hour  Intake 720 ml  Output 400 ml  Net 320 ml    Filed Weights   02/16/21 1244 02/16/21 1535  Weight: 64.4 kg 67.8 kg    Examination: General exam: Appears calm and comfortable  Respiratory system: Clear to auscultation. Respiratory effort normal. Cardiovascular system: S1 & S2 heard, RRR. No JVD, murmurs, rubs, gallops or clicks. No pedal edema. Gastrointestinal system: Abdomen is nondistended, soft and nontender. No organomegaly or masses felt. Normal bowel sounds heard. Central nervous system: Alert and oriented. No focal neurological deficits. Extremities: Symmetric 5 x 5 power. Skin: No rashes, lesions or ulcers.  Psychiatry: Judgement and insight appear normal. Mood & affect appropriate.    Data Reviewed: I have personally reviewed following labs and imaging studies  CBC: Recent Labs  Lab 02/16/21 1406 02/17/21 0233 02/18/21 0458 02/19/21 0409 02/20/21  0500  WBC 1.0* 0.8* 0.9* 1.8* 3.8*  NEUTROABS 0.5* 0.3* 0.3*  --  2.0  HGB 6.1* 5.8* 6.1* 5.6* 5.8*  HCT 18.4* 19.6* 19.4* 17.9* 18.8*  MCV 92.9 104.8* 97.5 98.4 101.1*  PLT 29* 40* 54* 65* 86*    Basic Metabolic Panel: Recent Labs  Lab 02/16/21 1330 02/17/21 0233 02/18/21 0458 02/19/21 0409  NA 129* 133* 136 133*  K 3.6 3.4* 3.5 3.6  CL 97* 101 105 104  CO2 23 22 25 24   GLUCOSE 126* 94 92 108*  BUN 28* 28* 28* 23  CREATININE 2.14* 1.92* 1.67* 1.71*  CALCIUM 8.5* 8.1* 8.6* 8.4*    GFR: Estimated Creatinine Clearance: 23.1 mL/min (A) (by C-G formula based on SCr of 1.71 mg/dL (H)). Liver Function Tests: Recent Labs  Lab 02/16/21 1330  AST 19  ALT 18  ALKPHOS 104  BILITOT 0.4  PROT 6.4*  ALBUMIN 3.5    Recent Labs  Lab 02/16/21 1330  LIPASE 21    Recent Labs  Lab 02/16/21 1406  AMMONIA 13    Coagulation Profile: No results for input(s): INR, PROTIME in the last 168 hours. Cardiac  Enzymes: No results for input(s): CKTOTAL, CKMB, CKMBINDEX, TROPONINI in the last 168 hours. BNP (last 3 results) No results for input(s): PROBNP in the last 8760 hours. HbA1C: No results for input(s): HGBA1C in the last 72 hours. CBG: No results for input(s): GLUCAP in the last 168 hours. Lipid Profile: No results for input(s): CHOL, HDL, LDLCALC, TRIG, CHOLHDL, LDLDIRECT in the last 72 hours. Thyroid Function Tests: No results for input(s): TSH, T4TOTAL, FREET4, T3FREE, THYROIDAB in the last 72 hours. Anemia Panel: No results for input(s): VITAMINB12, FOLATE, FERRITIN, TIBC, IRON, RETICCTPCT in the last 72 hours.  Sepsis Labs: No results for input(s): PROCALCITON, LATICACIDVEN in the last 168 hours.  Recent Results (from the past 240 hour(s))  Resp Panel by RT-PCR (Flu A&B, Covid) Nasopharyngeal Swab     Status: None   Collection Time: 02/16/21  1:30 PM   Specimen: Nasopharyngeal Swab; Nasopharyngeal(NP) swabs in vial transport medium  Result Value Ref Range Status   SARS Coronavirus 2 by RT PCR NEGATIVE NEGATIVE Final    Comment: (NOTE) SARS-CoV-2 target nucleic acids are NOT DETECTED.  The SARS-CoV-2 RNA is generally detectable in upper respiratory specimens during the acute phase of infection. The lowest concentration of SARS-CoV-2 viral copies this assay can detect is 138 copies/mL. A negative result does not preclude SARS-Cov-2 infection and should not be used as the sole basis for treatment or other patient management decisions. A negative result may occur with  improper specimen collection/handling, submission of specimen other than nasopharyngeal swab, presence of viral mutation(s) within the areas targeted by this assay, and inadequate number of viral copies(<138 copies/mL). A negative result must be combined with clinical observations, patient history, and epidemiological information. The expected result is Negative.  Fact Sheet for Patients:   EntrepreneurPulse.com.au  Fact Sheet for Healthcare Providers:  IncredibleEmployment.be  This test is no t yet approved or cleared by the [place] FDA and  has been authorized for detection and/or diagnosis of SARS-CoV-2 by FDA under an Emergency Use Authorization (EUA). This EUA will remain  in effect (meaning this test can be used) for the duration of the COVID-19 declaration under Section 564(b)(1) of the Act, 21 U.S.C.section 360bbb-3(b)(1), unless the authorization is terminated  or revoked sooner.       Influenza A by PCR NEGATIVE NEGATIVE Final   Influenza  B by PCR NEGATIVE NEGATIVE Final    Comment: (NOTE) The Xpert Xpress SARS-CoV-2/FLU/RSV plus assay is intended as an aid in the diagnosis of influenza from Nasopharyngeal swab specimens and should not be used as a sole basis for treatment. Nasal washings and aspirates are unacceptable for Xpert Xpress SARS-CoV-2/FLU/RSV testing.  Fact Sheet for Patients: EntrepreneurPulse.com.au  Fact Sheet for Healthcare Providers: IncredibleEmployment.be  This test is not yet approved or cleared by the [place] FDA and has been authorized for detection and/or diagnosis of SARS-CoV-2 by FDA under an Emergency Use Authorization (EUA). This EUA will remain in effect (meaning this test can be used) for the duration of the COVID-19 declaration under Section 564(b)(1) of the Act, 21 U.S.C. section 360bbb-3(b)(1), unless the authorization is terminated or revoked.  Performed at [facility], [address] [place], [place] [address]   Culture, blood (routine x 2)     Status: None (Preliminary result)   Collection Time: 02/16/21  4:20 PM   Specimen: BLOOD  Result Value Ref Range Status   Specimen Description   Final    BLOOD LEFT ANTECUBITAL Performed at [facility], [address] [place], [place] [address]    Special  Requests   Final    BOTTLES DRAWN AEROBIC ONLY Blood Culture adequate volume Performed at [facility], [address] [place], [place] [address]    Culture   Final    NO GROWTH 4 DAYS Performed at [facility], [place] [address] [place], [place] [address]    Report Status PENDING  Incomplete  Urine culture     Status: None   Collection Time: 02/16/21  4:25 PM   Specimen: Urine, Random  Result Value Ref Range Status   Specimen Description   Final    URINE, RANDOM Performed at [facility], [address] [place], [place] [address]    Special Requests   Final    NONE Performed at [facility], [address] [place], [place] [address]    Culture   Final    NO GROWTH Performed at [facility], [place] [address], [place], [place] [address]    Report Status 02/18/2021 FINAL  Final  Culture, blood (routine x 2)     Status: None (Preliminary result)   Collection Time: 02/16/21  4:30 PM   Specimen: BLOOD  Result Value Ref Range Status   Specimen Description   Final    BLOOD RIGHT ANTECUBITAL Performed at [facility], [address] [place], [place] [address]    Special Requests   Final    BOTTLES DRAWN AEROBIC ONLY Blood Culture results may not be optimal due to an inadequate volume of blood received in culture bottles Performed at [facility], [address] [place], [place] [address]    Culture   Final    NO GROWTH 4 DAYS Performed at [facility], [place] [address] [place], [place] [address]    Report Status PENDING  Incomplete  MRSA PCR Screening     Status: None   Collection Time: 02/16/21  8:00 PM  Result Value Ref Range Status   MRSA by PCR NEGATIVE NEGATIVE Final    Comment:        The GeneXpert MRSA Assay (FDA approved for NASAL specimens only), is one component of a comprehensive MRSA colonization surveillance program. It is not intended  to diagnose MRSA infection nor to guide or monitor  treatment for MRSA infections. Performed at [facility], [place] [address], [place], [place] [address]        Radiology Studies: No results found.  Scheduled Meds:  Chlorhexidine Gluconate Cloth  6 each Topical Q0600   dronabinol  2.5 mg Oral BID AC   epoetin alfa  40,000 Units Subcutaneous QPC breakfast   megestrol  400 mg Oral BID   metoprolol tartrate  25 mg Oral BID   multivitamin with minerals  1 tablet Oral Daily   sodium chloride flush  3 mL Intravenous Q12H   Tbo-Filgrastim  480 mcg Subcutaneous q1800   Continuous Infusions:     LOS: 4 days   Time spent: 38 minutes   Darliss Cheney, MD [facility]  02/20/2021, 1:33 PM   How to contact the [facility] Attending or Consulting provider [place] or covering provider during after hours [place], for this patient?  Check the care team in [facility] and look for a) attending/consulting [facility] provider listed and b) the [facility] team listed. Page or secure chat [phone]. Log into [email or web address] and use [place]'s universal password to access. If you do not have the password, please contact the hospital operator. Locate the [facility] provider you are looking for under [facility] and page to a number that you can be directly reached. If you still have difficulty reaching the provider, please page the [facility] (Director on Call) for the Hospitalists listed on amion for assistance.

## 2021-02-20 NOTE — Progress Notes (Signed)
Pts son Vanessa Garrett was given permission to stay the night as pt had episodes of confusion and getting OOB, pulling IV tubing apart

## 2021-02-21 ENCOUNTER — Telehealth: Payer: Self-pay

## 2021-02-21 LAB — BASIC METABOLIC PANEL
Anion gap: 5 (ref 5–15)
BUN: 22 mg/dL (ref 8–23)
CO2: 26 mmol/L (ref 22–32)
Calcium: 8.5 mg/dL — ABNORMAL LOW (ref 8.9–10.3)
Chloride: 105 mmol/L (ref 98–111)
Creatinine, Ser: 1.3 mg/dL — ABNORMAL HIGH (ref 0.44–1.00)
GFR, Estimated: 42 mL/min — ABNORMAL LOW (ref >60)
Glucose, Bld: 84 mg/dL (ref 70–99)
Potassium: 3.3 mmol/L — ABNORMAL LOW (ref 3.5–5.1)
Sodium: 136 mmol/L (ref 135–145)

## 2021-02-21 LAB — CBC WITH DIFFERENTIAL/PLATELET
Abs Immature Granulocytes: 1.26 10*3/uL — ABNORMAL HIGH (ref 0.00–0.07)
Basophils Absolute: 0.1 10*3/uL (ref 0.0–0.1)
Basophils Relative: 1 %
Eosinophils Absolute: 0.1 10*3/uL (ref 0.0–0.5)
Eosinophils Relative: 1 %
HCT: 19.4 % — ABNORMAL LOW (ref 36.0–46.0)
Hemoglobin: 5.9 g/dL — CL (ref 12.0–15.0)
Immature Granulocytes: 14 %
Lymphocytes Relative: 11 %
Lymphs Abs: 1 10*3/uL (ref 0.7–4.0)
MCH: 30.9 pg (ref 26.0–34.0)
MCHC: 30.4 g/dL (ref 30.0–36.0)
MCV: 101.6 fL — ABNORMAL HIGH (ref 80.0–100.0)
Monocytes Absolute: 1.6 10*3/uL — ABNORMAL HIGH (ref 0.1–1.0)
Monocytes Relative: 18 %
Neutro Abs: 5.1 10*3/uL (ref 1.7–7.7)
Neutrophils Relative %: 55 %
Platelets: 97 10*3/uL — ABNORMAL LOW (ref 150–400)
RBC: 1.91 MIL/uL — ABNORMAL LOW (ref 3.87–5.11)
RDW: 21.1 % — ABNORMAL HIGH (ref 11.5–15.5)
WBC: 9 10*3/uL (ref 4.0–10.5)
nRBC: 1.6 % — ABNORMAL HIGH (ref 0.0–0.2)

## 2021-02-21 LAB — CULTURE, BLOOD (ROUTINE X 2)
Culture: NO GROWTH
Culture: NO GROWTH
Special Requests: ADEQUATE

## 2021-02-21 MED ORDER — MEGESTROL ACETATE 20 MG PO TABS: 20.0000 mg | ORAL_TABLET | Freq: Two times a day (BID) | ORAL | 0 refills | Status: DC

## 2021-02-21 MED ORDER — HEPARIN SOD (PORK) LOCK FLUSH 100 UNIT/ML IV SOLN
500.0000 [IU] | INTRAVENOUS | Status: AC | PRN
  Administered 2021-02-21: 500 [IU]
  Filled 2021-02-21: qty 5

## 2021-02-21 MED ORDER — POTASSIUM CHLORIDE CRYS ER 10 MEQ PO TBCR
40.0000 meq | EXTENDED_RELEASE_TABLET | Freq: Once | ORAL | Status: AC
  Administered 2021-02-21: 40 meq via ORAL
  Filled 2021-02-21: qty 4

## 2021-02-21 NOTE — Progress Notes (Signed)
Went over discharge instructions w/ pt and pt daughter [name]. Pt daughter agreed w/ the plan.

## 2021-02-21 NOTE — Discharge Summary (Addendum)
Physician Discharge Summary  Vanessa Garrett GUR:[id number] DOB: 09/17/1940 DOA: 02/16/2021  PCP: Einar Pheasant, DO  Admit date: 02/16/2021 Discharge date: 02/21/2021 30 Day Unplanned Readmission Risk Score    Flowsheet Row ED to Hosp-Admission (Current) from 02/16/2021 in [facility]  30 Day Unplanned Readmission Risk Score (%) 25.73 Filed at 02/21/2021 0801       This score is the patient's risk of an unplanned readmission within 30 days of being discharged (0 -100%). The score is based on dignosis, age, lab data, medications, orders, and past utilization.   Low:  0-14.9   Medium: 15-21.9   High: 22-29.9   Extreme: 30 and above           Admitted From: Home Disposition: Home  Recommendations for Outpatient Follow-up:  Follow up with PCP in 1-2 weeks Follow-up with your primary oncologist as per their recommendations Please obtain BMP/CBC in one week Please follow up with your PCP on the following pending results: Unresulted Labs (From admission, onward)     Start     Ordered   02/18/21 0500  CBC with Differential/Platelet  Daily,   R     Question:  Specimen collection method  Answer:  Lab=Lab collect   02/17/21 1000   02/16/21 1251  CBC with Differential  Once,   R        02/16/21 Trail: None, PT recommended home health PT but patient declined that Equipment/Devices: None  Discharge Condition: Stable CODE STATUS: Full code Diet recommendation: Cardiac  Subjective: Seen and examined.  Daughter at the bedside.  Patient is fully alert and oriented and she has no complaints and she wants to go home.  Her daughter is also in agreement.  Brief/Interim Summary: Vanessa Garrett is a 80 y.o. 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  She sees occasional spotting of blood on tissue paper after bowel movements without any other obvious bleeding.  Per family, she was confused earlier but she was fully alert and oriented upon arrival to ED.  Upon arrival to ED, she was hemodynamically stable.   Labs show WBC 1.0, hemoglobin 6.1, platelets 29,000, ANC 500, sodium 129, potassium 3.6, bicarb 23, BUN 28, creatinine 2.14, ammonia 13.Urinalysis negative for UTI.  Blood and urine cultures obtained. SARS-CoV-2 PCR panel negative.  FOBT negative.  Chest x-ray unremarkable.  CT head unremarkable.   Patient was given 500 cc normal saline bolus.  EDP discussed with patient's oncologist, Dr. Marin Olp, who recommended obtaining blood and urine cultures, starting cefepime, and medical admission.  The hospitalist service was consulted to admit for further evaluation and management.  Further course of hospitalization as below  Pancytopenia with fatigue/lethargy: Presented with WBC 1.0, ANC 500, hemoglobin 6.1, platelets 29,000.  Symptoms likely from anemia.  Suspect chemotherapy associated with likely chronic blood loss.   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  Patient was started on antibiotics per oncology recommendation due to having low-grade fever however she had remained afebrile ever since  so antibiotics were discontinued on 02/19/2021.  Patient was receiving Neupogen and Procrit.  White cells improved significantly to 3.8.  Platelets also improved.  Per my conversation with Dr. Marin Olp, he is only criteria to keep patient in the hospital was white cells less than 2.  He had mentioned that patient can be discharged if white cells are more than 2 , her white cells were 3.8 on  02/20/2021 and she was ready for discharge however her son was very opposed to that due to her hemoglobin being stable at 5.8 and had not improved, and for that reason, patient was kept in the hospital yesterday as Dr. Marin Olp was not available on the weekend.  Finally, Dr. Marin Olp saw this patient today and had a discussion with patient's son and the family is agreeable with the discharge today.Patient was already on Retacrit at home 3 times a week.  Oncology is planning to increase the dosage according to the daughter.  Her hemoglobin today is 5.9.   Acute metabolic encephalopathy: Patient was alert and oriented upon arrival to ED and remained alert and oriented during all encounters that I had with her since last few days.   CKD stage IV: Near recent baseline.  Continue monitor.   Hyponatremia: Improved,   Generalized weakness: PT OT consulted.  They recommend home with home health   Hypokalemia: Resolved.   Stage IV adenocarcinoma of the gallbladder with liver and lymph node metastases: Oncology on board and management per them.   Hypertension: Blood pressure controlled, resume home medications.  Discharge Diagnoses:  Principal Problem:   Pancytopenia ([place]) Active Problems:   Gallbladder cancer ([place])   CKD (chronic kidney disease), stage IV ([facility])   Hyponatremia   Essential hypertension   Malnutrition of moderate degree    Discharge Instructions   Allergies as of 02/21/2021   No Known Allergies      Medication List     STOP taking these medications    amoxicillin-clavulanate 875-125 MG tablet Commonly known as: AUGMENTIN   LORazepam 0.5 MG tablet Commonly known as: Ativan   ondansetron 8 MG tablet Commonly known as: Zofran   prochlorperazine 10 MG tablet Commonly known as: COMPAZINE       TAKE these medications    dexamethasone 4 MG tablet Commonly known as: DECADRON Take 2 tablets (8 mg total) by mouth daily. Start the day after carboplatin chemotherapy  for 3 days. What changed: when to take this   dronabinol 2.5 MG capsule Commonly known as: MARINOL Take 1 capsule (2.5 mg total) by mouth 2 (two) times daily before a meal.   Generlac 10 GM/15ML Soln Generic drug: lactulose (encephalopathy) Take 10 g by mouth daily as needed (constipation).   lidocaine-prilocaine cream Commonly known as: EMLA Apply to affected area once What changed:  how much to take how to take this when to take this additional instructions   megestrol 20 MG tablet Commonly known as: MEGACE Take 1 tablet (20 mg total) by mouth 2 (two) times daily.   metoprolol tartrate 25 MG tablet Commonly known as: LOPRESSOR TAKE 1 TABLET(25 MG) BY MOUTH TWICE DAILY What changed: See the new instructions.   NON FORMULARY Take 2 tablets by mouth daily. Mega Food - Blood Builder   NON FORMULARY 1.25 mg every other day. Beet Root Powder   potassium chloride SA 20 MEQ tablet Commonly known as: KLOR-CON TAKE 1 TABLET BY MOUTH EVERY DAY   Retacrit 10000 UNIT/ML injection Generic drug: epoetin alfa-epbx Inject 1 mL (  10,000 Units total) into the skin 3 (three) times a week.   S.S.S. TONIC PO Take 45 mLs by mouth every other day.   triamterene-hydrochlorothiazide 75-50 MG tablet Commonly known as: MAXZIDE Take 1 tablet by mouth daily.               Durable Medical Equipment  (From admission, onward)           Start     Ordered   02/18/21 1551  For home use only DME Bedside commode  Once       Question:  Patient needs a bedside commode to treat with the following condition  Answer:  Balance problem   02/18/21 [place]     Einar Pheasant, DO Follow up in 1 week(s).   Specialty: Family Medicine Contact information: [address] [place]  [address] [phone]                No Known Allergies  Consultations: Oncology   Procedures/Studies: CT Head Wo Contrast  Result Date:  02/16/2021 CLINICAL DATA:  Mental status change EXAM: CT HEAD WITHOUT CONTRAST TECHNIQUE: Contiguous axial images were obtained from the base of the skull through the vertex without intravenous contrast. COMPARISON:  None. FINDINGS: Brain: There is no acute intracranial hemorrhage, mass effect, or edema. Gray-white differentiation is preserved. There is no extra-axial fluid collection. Patchy and confluent areas of low-attenuation in the supratentorial white matter are nonspecific but may reflect moderate chronic microvascular ischemic changes. Prominence of the ventricles and sulci reflects generalized parenchymal volume loss. Vascular: There is atherosclerotic calcification at the skull base. Skull: Calvarium is unremarkable. Sinuses/Orbits: Some frothy retained secretions in the left sphenoid sinus. No acute abnormality of the orbits. Other: None. IMPRESSION: No acute intracranial abnormality. Chronic microvascular ischemic changes. Electronically Signed   By: Macy Mis M.D.   On: 02/16/2021 14:10   MR ABDOMEN WWO CONTRAST  Result Date: 02/14/2021 CLINICAL DATA:  80 year old female with history of hepatiobiliary cancer on chemotherapy. Follow-up study. EXAM: MRI ABDOMEN WITHOUT AND WITH CONTRAST TECHNIQUE: Multiplanar multisequence MR imaging of the abdomen was performed both before and after the administration of intravenous contrast. CONTRAST:  65mL GADAVIST GADOBUTROL 1 MMOL/ML IV SOLN COMPARISON:  Abdominal MRI 11/13/2020. FINDINGS: Lower chest: Unremarkable. Hepatobiliary: Severe diffuse low signal intensity throughout the hepatic parenchyma on T1 and T2 weighted images, indicative of severe iron deposition. Several hepatic lesions are again noted, although these are decreased in number and size compared to the prior examination indicative of a positive response to therapy. Specifically, the heterogeneously enhancing lesion in the gallbladder fossa currently measures 4.2 x 3.1 cm (axial image 52 of  series 20) and previously measured 6.9 x 5.3 cm on 11/13/2020. Another large lesion in the central aspect of segment 7 of the liver (axial image 31 of series 20) has also decreased, currently measuring 3.3 x 3.0 cm (previously 6.6 x 5.4 cm). Lesion in the central aspect of the liver predominantly in segment 4B (axial image 36 of series 20) measuring 2.2 x 1.6 cm (previously 4.9 x 4.3 cm). Another lesion in the superior aspect of the right lobe of the liver between segments 7 and 8 which previously demonstrated enhancement no longer enhances, and is smaller on pre gadolinium T2 weighted images (axial image 9 of series 4) currently measuring only 1.3 x 0.9 cm (previously 4.1 x 3.4 cm). No new hepatic lesions are noted.  Moderate intrahepatic biliary ductal dilatation noted throughout the left lobe of the liver (none in the right) and moderate extrahepatic biliary ductal dilatation, with the common bile duct measuring 1.5 cm proximally and 8 mm distally. No filling defect within the common bile duct to suggest choledocholithiasis. Pancreas: No pancreatic mass. No pancreatic ductal dilatation. No pancreatic or peripancreatic fluid collections or inflammatory changes. Spleen: Diffuse low signal intensity throughout the splenic parenchyma on pre gadolinium pulse sequences indicative of severe iron deposition. Adrenals/Urinary Tract: Bilateral kidneys and adrenal glands are normal in appearance. No hydroureteronephrosis in the visualized portions of the abdomen. Stomach/Bowel: Visualized portions are unremarkable. Vascular/Lymphatic: No aneurysm identified in the visualized abdominal vasculature. No lymphadenopathy noted in the abdomen. Other: No significant volume of ascites in the visualized portions of the peritoneal cavity. Musculoskeletal: No aggressive appearing osseous lesions are noted in the visualized portions of the skeleton. IMPRESSION: 1. Today's study demonstrates positive response to therapy with regression  of both the lesion in the gallbladder fossa, as well as the other metastatic lesions scattered throughout the hepatic parenchyma, as detailed above. 2. Persistent moderate common bile duct dilatation and left-sided intrahepatic biliary ductal dilatation, similar to the prior examination. No choledocholithiasis. 3. Severe iron deposition in the liver and spleen again noted. Electronically Signed   By: Vinnie Langton M.D.   On: 02/14/2021 11:16   DG Chest Port 1 View  Result Date: 02/16/2021 CLINICAL DATA:  Weakness. EXAM: PORTABLE CHEST 1 VIEW COMPARISON:  No prior. FINDINGS: PowerPort catheter with tip over SVC. Heart size normal. No pulmonary venous congestion. No focal infiltrate. No pleural effusion or pneumothorax. IMPRESSION: PowerPort catheter noted with tip over SVC. No acute cardiopulmonary disease. Electronically Signed   By: Marcello Moores  Register   On: 02/16/2021 13:36     Discharge Exam: Vitals:   02/20/21 2146 02/21/21 0507  BP: (!) 108/52 (!) 124/58  Pulse: 80 75  Resp:  16  Temp:  98.7 F (37.1 C)  SpO2:  100%   Vitals:   02/20/21 2008 02/20/21 2143 02/20/21 2146 02/21/21 0507  BP: (!) 111/58 (!) 108/52 (!) 108/52 (!) 124/58  Pulse: 85 80 80 75  Resp: 20   16  Temp: 98.3 F (36.8 C)   98.7 F (37.1 C)  TempSrc: Oral   Oral  SpO2: 98%   100%  Weight:      Height:        General: Pt is alert, awake, not in acute distress Cardiovascular: RRR, S1/S2 +, no rubs, no gallops Respiratory: CTA bilaterally, no wheezing, no rhonchi Abdominal: Soft, NT, ND, bowel sounds + Extremities: no edema, no cyanosis    The results of significant diagnostics from this hospitalization (including imaging, microbiology, ancillary and laboratory) are listed below for reference.     Microbiology: Recent Results (from the past 240 hour(s))  Resp Panel by RT-PCR (Flu A&B, Covid) Nasopharyngeal Swab     Status: None   Collection Time: 02/16/21  1:30 PM   Specimen: Nasopharyngeal Swab;  Nasopharyngeal(NP) swabs in vial transport medium  Result Value Ref Range Status   SARS Coronavirus 2 by RT PCR NEGATIVE NEGATIVE Final    Comment: (NOTE) SARS-CoV-2 target nucleic acids are NOT DETECTED.  The SARS-CoV-2 RNA is generally detectable in upper respiratory specimens during the acute phase of infection. The lowest concentration of SARS-CoV-2 viral copies this assay can detect is 138 copies/mL. A negative result does not preclude SARS-Cov-2 infection and should not be used as the sole basis for treatment  or other patient management decisions. A negative result may occur with  improper specimen collection/handling, submission of specimen other than nasopharyngeal swab, presence of viral mutation(s) within the areas targeted by this assay, and inadequate number of viral copies(<138 copies/mL). A negative result must be combined with clinical observations, patient history, and epidemiological information. The expected result is Negative.  Fact Sheet for Patients:  EntrepreneurPulse.com.au  Fact Sheet for Healthcare Providers:  IncredibleEmployment.be  This test is no t yet approved or cleared by the [place] FDA and  has been authorized for detection and/or diagnosis of SARS-CoV-2 by FDA under an Emergency Use Authorization (EUA). This EUA will remain  in effect (meaning this test can be used) for the duration of the COVID-19 declaration under Section 564(b)(1) of the Act, 21 U.S.C.section 360bbb-3(b)(1), unless the authorization is terminated  or revoked sooner.       Influenza A by PCR NEGATIVE NEGATIVE Final   Influenza B by PCR NEGATIVE NEGATIVE Final    Comment: (NOTE) The Xpert Xpress SARS-CoV-2/FLU/RSV plus assay is intended as an aid in the diagnosis of influenza from Nasopharyngeal swab specimens and should not be used as a sole basis for treatment. Nasal washings and aspirates are unacceptable for Xpert Xpress  SARS-CoV-2/FLU/RSV testing.  Fact Sheet for Patients: EntrepreneurPulse.com.au  Fact Sheet for Healthcare Providers: IncredibleEmployment.be  This test is not yet approved or cleared by the [place] FDA and has been authorized for detection and/or diagnosis of SARS-CoV-2 by FDA under an Emergency Use Authorization (EUA). This EUA will remain in effect (meaning this test can be used) for the duration of the COVID-19 declaration under Section 564(b)(1) of the Act, 21 U.S.C. section 360bbb-3(b)(1), unless the authorization is terminated or revoked.  Performed at [facility], [address] [place], [place] [address]   Culture, blood (routine x 2)     Status: None   Collection Time: 02/16/21  4:20 PM   Specimen: BLOOD  Result Value Ref Range Status   Specimen Description   Final    BLOOD LEFT ANTECUBITAL Performed at [facility], [address] [place], [place] [address]    Special Requests   Final    BOTTLES DRAWN AEROBIC ONLY Blood Culture adequate volume Performed at [facility], [address] [place], [place] [address]    Culture   Final    NO GROWTH 5 DAYS Performed at [facility], [place] [address] [place], [place] [address]    Report Status 02/21/2021 FINAL  Final  Urine culture     Status: None   Collection Time: 02/16/21  4:25 PM   Specimen: Urine, Random  Result Value Ref Range Status   Specimen Description   Final    URINE, RANDOM Performed at [facility], [address] [place], [place] [address]    Special Requests   Final    NONE Performed at [facility], [address] [place], [place] [address]    Culture   Final    NO GROWTH Performed at [facility], [place] [address] [place], [place] [address]    Report Status 02/18/2021 FINAL  Final  Culture, blood (routine x 2)     Status: None   Collection Time: 02/16/21  4:30 PM   Specimen: BLOOD   Result Value Ref Range Status   Specimen Description   Final    BLOOD RIGHT ANTECUBITAL Performed at [facility], [address] [place], [place] [address]    Special Requests   Final    BOTTLES DRAWN AEROBIC ONLY Blood Culture results may not be optimal due to an inadequate volume of blood received in culture bottles Performed at [facility], [address] [place], [place] [address]    Culture   Final    NO GROWTH 5 DAYS Performed at [facility], [place] [address], [place], [place] [address]    Report Status 02/21/2021 FINAL  Final  MRSA PCR Screening     Status: None   Collection Time: 02/16/21  8:00 PM  Result Value Ref Range Status   MRSA by PCR NEGATIVE NEGATIVE Final    Comment:        The GeneXpert MRSA Assay (FDA approved for NASAL specimens only), is one component of a comprehensive MRSA colonization surveillance program. It is not intended to diagnose MRSA infection nor to guide or monitor treatment for MRSA infections. Performed at [facility], [place] [address], [place], [place] [address]      Labs: BNP (last 3 results) No results for input(s): BNP in the last 8760 hours. Basic Metabolic Panel: Recent Labs  Lab 02/16/21 1330 02/17/21 0233 02/18/21 0458 02/19/21 0409 02/21/21 0400  NA 129* 133* 136 133* 136  K 3.6 3.4* 3.5 3.6 3.3*  CL 97* 101 105 104 105  CO2 23 22 25 24 26   GLUCOSE 126* 94 92 108* 84  BUN 28* 28* 28* 23 22  CREATININE 2.14* 1.92* 1.67* 1.71* 1.30*  CALCIUM 8.5* 8.1* 8.6* 8.4* 8.5*   Liver Function Tests: Recent Labs  Lab 02/16/21 1330  AST 19  ALT 18  ALKPHOS 104  BILITOT 0.4  PROT 6.4*  ALBUMIN 3.5   Recent Labs  Lab 02/16/21 1330  LIPASE 21   Recent Labs  Lab 02/16/21 1406  AMMONIA 13   CBC: Recent Labs  Lab 02/16/21 1406 02/17/21 0233 02/18/21 0458 02/19/21 0409 02/20/21 0500 02/21/21 0400  WBC 1.0* 0.8* 0.9* 1.8* 3.8* 9.0  NEUTROABS 0.5* 0.3* 0.3*  --   2.0 5.1  HGB 6.1* 5.8* 6.1* 5.6* 5.8* 5.9*  HCT 18.4* 19.6* 19.4* 17.9* 18.8* 19.4*  MCV 92.9 104.8* 97.5 98.4 101.1* 101.6*  PLT 29* 40* 54* 65* 86* 97*   Cardiac Enzymes: No results for input(s): CKTOTAL, CKMB, CKMBINDEX, TROPONINI in the last 168 hours. BNP: Invalid input(s): POCBNP CBG: No results for input(s): GLUCAP in the last 168 hours. D-Dimer No results for input(s): DDIMER in the last 72 hours. Hgb A1c No results for input(s): HGBA1C in the last 72 hours. Lipid Profile No results for input(s): CHOL, HDL, LDLCALC, TRIG, CHOLHDL, LDLDIRECT in the last 72 hours. Thyroid function studies No results for input(s): TSH, T4TOTAL, T3FREE, THYROIDAB in the last 72 hours.  Invalid input(s): FREET3 Anemia work up No results for input(s): VITAMINB12, FOLATE, FERRITIN, TIBC, IRON, RETICCTPCT in the last 72 hours. Urinalysis    Component Value Date/Time   COLORURINE YELLOW 02/16/2021 1625   APPEARANCEUR CLEAR 02/16/2021 1625   LABSPEC 1.020 02/16/2021 1625   PHURINE 5.5 02/16/2021 1625   GLUCOSEU NEGATIVE 02/16/2021 1625   HGBUR SMALL (A) 02/16/2021 1625   BILIRUBINUR NEGATIVE 02/16/2021 1625   KETONESUR NEGATIVE 02/16/2021 1625   PROTEINUR NEGATIVE 02/16/2021 1625   NITRITE NEGATIVE 02/16/2021 1625   LEUKOCYTESUR NEGATIVE 02/16/2021 1625   Sepsis Labs Invalid input(s): PROCALCITONIN,  WBC,  LACTICIDVEN Microbiology Recent Results (from  the past 240 hour(s))  Resp Panel by RT-PCR (Flu A&B, Covid) Nasopharyngeal Swab     Status: None   Collection Time: 02/16/21  1:30 PM   Specimen: Nasopharyngeal Swab; Nasopharyngeal(NP) swabs in vial transport medium  Result Value Ref Range Status   SARS Coronavirus 2 by RT PCR NEGATIVE NEGATIVE Final    Comment: (NOTE) SARS-CoV-2 target nucleic acids are NOT DETECTED.  The SARS-CoV-2 RNA is generally detectable in upper respiratory specimens during the acute phase of infection. The lowest concentration of SARS-CoV-2 viral copies this  assay can detect is 138 copies/mL. A negative result does not preclude SARS-Cov-2 infection and should not be used as the sole basis for treatment or other patient management decisions. A negative result may occur with  improper specimen collection/handling, submission of specimen other than nasopharyngeal swab, presence of viral mutation(s) within the areas targeted by this assay, and inadequate number of viral copies(<138 copies/mL). A negative result must be combined with clinical observations, patient history, and epidemiological information. The expected result is Negative.  Fact Sheet for Patients:  EntrepreneurPulse.com.au  Fact Sheet for Healthcare Providers:  IncredibleEmployment.be  This test is no t yet approved or cleared by the [place] FDA and  has been authorized for detection and/or diagnosis of SARS-CoV-2 by FDA under an Emergency Use Authorization (EUA). This EUA will remain  in effect (meaning this test can be used) for the duration of the COVID-19 declaration under Section 564(b)(1) of the Act, 21 U.S.C.section 360bbb-3(b)(1), unless the authorization is terminated  or revoked sooner.       Influenza A by PCR NEGATIVE NEGATIVE Final   Influenza B by PCR NEGATIVE NEGATIVE Final    Comment: (NOTE) The Xpert Xpress SARS-CoV-2/FLU/RSV plus assay is intended as an aid in the diagnosis of influenza from Nasopharyngeal swab specimens and should not be used as a sole basis for treatment. Nasal washings and aspirates are unacceptable for Xpert Xpress SARS-CoV-2/FLU/RSV testing.  Fact Sheet for Patients: EntrepreneurPulse.com.au  Fact Sheet for Healthcare Providers: IncredibleEmployment.be  This test is not yet approved or cleared by the [place] FDA and has been authorized for detection and/or diagnosis of SARS-CoV-2 by FDA under an Emergency Use Authorization (EUA). This EUA will  remain in effect (meaning this test can be used) for the duration of the COVID-19 declaration under Section 564(b)(1) of the Act, 21 U.S.C. section 360bbb-3(b)(1), unless the authorization is terminated or revoked.  Performed at [facility], [address] [place], [place] [address]   Culture, blood (routine x 2)     Status: None   Collection Time: 02/16/21  4:20 PM   Specimen: BLOOD  Result Value Ref Range Status   Specimen Description   Final    BLOOD LEFT ANTECUBITAL Performed at [facility], [address] [place], [place] [address]    Special Requests   Final    BOTTLES DRAWN AEROBIC ONLY Blood Culture adequate volume Performed at [facility], [address] [place], [place] [address]    Culture   Final    NO GROWTH 5 DAYS Performed at [facility], [place] [address], [place], [place] [address]    Report Status 02/21/2021 FINAL  Final  Urine culture     Status: None   Collection Time: 02/16/21  4:25 PM   Specimen: Urine, Random  Result Value Ref Range Status   Specimen Description   Final    URINE, RANDOM Performed at  [facility], [address] [place], [place] [address]    Special Requests   Final    NONE Performed at [facility], [address] [place], [place] [address]    Culture   Final    NO GROWTH Performed at [facility], [place] [address], [place], [place] [address]    Report Status 02/18/2021 FINAL  Final  Culture, blood (routine x 2)     Status: None   Collection Time: 02/16/21  4:30 PM   Specimen: BLOOD  Result Value Ref Range Status   Specimen Description   Final    BLOOD RIGHT ANTECUBITAL Performed at [facility], [address] [place], [place] [address]    Special Requests   Final    BOTTLES DRAWN AEROBIC ONLY Blood Culture results may not be optimal due to an inadequate volume of blood received in culture bottles Performed at [facility], [address] [place], [place] [address]    Culture   Final    NO GROWTH 5 DAYS Performed at [facility], [place] [address], [place], [place] [address]    Report Status 02/21/2021 FINAL  Final  MRSA PCR Screening     Status: None   Collection Time: 02/16/21  8:00 PM  Result Value Ref Range Status   MRSA by PCR NEGATIVE NEGATIVE Final    Comment:        The GeneXpert MRSA Assay (FDA approved for NASAL specimens only), is one component of a comprehensive MRSA colonization surveillance program. It is not intended to diagnose MRSA infection nor to guide or monitor treatment for MRSA infections. Performed at [facility], [place] [address] [place], [place] [address]      Time coordinating discharge: Over 30 minutes  SIGNED:   Darliss Cheney, MD  [facility] 02/21/2021, 10:00 AM  If 7PM-7AM, please contact night-coverage [email or web address]

## 2021-02-21 NOTE — Progress Notes (Signed)
Ms. Folts apparently has some confusion over the weekend.  I will know this might be some sundowning.  I know given her age, confusion certainly could be a possibility.  Her white cell count responded very well to Neupogen.  We will stop this.  Her hemoglobin is slowly trending upward.  Today is 5.9.  She is on daily Procrit.  Her platelet count is 97,000.  Again, her bone marrow is recovering.  This is a matter of getting her hemoglobin better.  She is eating better.  Hopefully this will help her blood counts little bit more.  Renal function is doing good.  Her BUN is 22 creatinine 1.3.  She seemed to be relatively alert this morning.  Her son-in-law was with her.  He stated the weekend.  Maybe, she will go home today.  All she is getting right now is the appropriate.  Again she needs this daily.  At home, she is getting Retacrit 3 times a week.  We did give her iron in the hospital.  This should allow the appropriate to improve its effectiveness.  Again, I really think that nutritions can be the key for Ms. Al[place] Dust.  If we can keep her eating well then she should be able to improve her blood counts.   Lattie Haw, MD  Lurena Joiner 1:37

## 2021-02-24 ENCOUNTER — Inpatient Hospital Stay: Payer: Medicare PPO

## 2021-02-24 ENCOUNTER — Other Ambulatory Visit: Payer: Self-pay | Admitting: Hematology & Oncology

## 2021-02-24 VITALS — BP 132/61 | HR 76 | Temp 98.8°F | Resp 17

## 2021-02-24 DIAGNOSIS — D6481 Anemia due to antineoplastic chemotherapy: Secondary | ICD-10-CM | POA: Diagnosis not present

## 2021-02-24 DIAGNOSIS — C23 Malignant neoplasm of gallbladder: Secondary | ICD-10-CM

## 2021-02-24 DIAGNOSIS — Z5111 Encounter for antineoplastic chemotherapy: Secondary | ICD-10-CM | POA: Diagnosis present

## 2021-02-24 DIAGNOSIS — C787 Secondary malignant neoplasm of liver and intrahepatic bile duct: Secondary | ICD-10-CM | POA: Diagnosis not present

## 2021-02-24 DIAGNOSIS — T451X5A Adverse effect of antineoplastic and immunosuppressive drugs, initial encounter: Secondary | ICD-10-CM | POA: Diagnosis not present

## 2021-02-24 DIAGNOSIS — Z5112 Encounter for antineoplastic immunotherapy: Secondary | ICD-10-CM | POA: Diagnosis not present

## 2021-02-24 DIAGNOSIS — C779 Secondary and unspecified malignant neoplasm of lymph node, unspecified: Secondary | ICD-10-CM | POA: Diagnosis not present

## 2021-02-24 DIAGNOSIS — M7989 Other specified soft tissue disorders: Secondary | ICD-10-CM | POA: Diagnosis not present

## 2021-02-24 DIAGNOSIS — IMO0001 Reserved for inherently not codable concepts without codable children: Secondary | ICD-10-CM

## 2021-02-24 DIAGNOSIS — Z79899 Other long term (current) drug therapy: Secondary | ICD-10-CM | POA: Diagnosis not present

## 2021-02-24 LAB — CBC WITH DIFFERENTIAL/PLATELET
Abs Immature Granulocytes: 0.76 10*3/uL — ABNORMAL HIGH (ref 0.00–0.07)
Basophils Absolute: 0 10*3/uL (ref 0.0–0.1)
Basophils Relative: 0 %
Eosinophils Absolute: 0 10*3/uL (ref 0.0–0.5)
Eosinophils Relative: 0 %
HCT: 22.1 % — ABNORMAL LOW (ref 36.0–46.0)
Hemoglobin: 7 g/dL — ABNORMAL LOW (ref 12.0–15.0)
Immature Granulocytes: 8 %
Lymphocytes Relative: 9 %
Lymphs Abs: 0.8 10*3/uL (ref 0.7–4.0)
MCH: 32.6 pg (ref 26.0–34.0)
MCHC: 31.7 g/dL (ref 30.0–36.0)
MCV: 102.8 fL — ABNORMAL HIGH (ref 80.0–100.0)
Monocytes Absolute: 1 10*3/uL (ref 0.1–1.0)
Monocytes Relative: 10 %
Neutro Abs: 6.8 10*3/uL (ref 1.7–7.7)
Neutrophils Relative %: 73 %
Platelets: 142 10*3/uL — ABNORMAL LOW (ref 150–400)
RBC: 2.15 MIL/uL — ABNORMAL LOW (ref 3.87–5.11)
RDW: 23.5 % — ABNORMAL HIGH (ref 11.5–15.5)
WBC: 9.4 10*3/uL (ref 4.0–10.5)
nRBC: 1.1 % — ABNORMAL HIGH (ref 0.0–0.2)

## 2021-02-24 LAB — COMPREHENSIVE METABOLIC PANEL
ALT: 11 U/L (ref 0–44)
AST: 22 U/L (ref 15–41)
Albumin: 3.7 g/dL (ref 3.5–5.0)
Alkaline Phosphatase: 125 U/L (ref 38–126)
Anion gap: 7 (ref 5–15)
BUN: 31 mg/dL — ABNORMAL HIGH (ref 8–23)
CO2: 26 mmol/L (ref 22–32)
Calcium: 9.2 mg/dL (ref 8.9–10.3)
Chloride: 99 mmol/L (ref 98–111)
Creatinine, Ser: 1.81 mg/dL — ABNORMAL HIGH (ref 0.44–1.00)
GFR, Estimated: 28 mL/min — ABNORMAL LOW (ref >60)
Glucose, Bld: 159 mg/dL — ABNORMAL HIGH (ref 70–99)
Potassium: 3.9 mmol/L (ref 3.5–5.1)
Sodium: 132 mmol/L — ABNORMAL LOW (ref 135–145)
Total Bilirubin: 0.4 mg/dL (ref 0.3–1.2)
Total Protein: 6.4 g/dL — ABNORMAL LOW (ref 6.5–8.1)

## 2021-02-24 MED ORDER — EPOETIN ALFA-EPBX 40000 UNIT/ML IJ SOLN
INTRAMUSCULAR | Status: AC
  Filled 2021-02-24: qty 1

## 2021-02-24 MED ORDER — EPOETIN ALFA-EPBX 40000 UNIT/ML IJ SOLN
40000.0000 [IU] | Freq: Once | INTRAMUSCULAR | Status: AC
  Administered 2021-02-24: 40000 [IU] via SUBCUTANEOUS

## 2021-02-24 MED ORDER — EPOETIN ALFA-EPBX 40000 UNIT/ML IJ SOLN: 40000.0000 [IU] | Freq: Once | INTRAMUSCULAR | Status: DC

## 2021-02-24 NOTE — Patient Instructions (Signed)
Epoetin Alfa injection What is this medication? EPOETIN ALFA (e POE e tin AL fa) helps your body make more red blood cells. This medicine is used to treat anemia caused by chronic kidney disease, cancer chemotherapy, or HIV-therapy. It may also be used before surgery if you have anemia. This medicine may be used for other purposes; ask your health care provider or pharmacist if you have questions. COMMON BRAND NAME(S): Epogen, Procrit, Retacrit What should I tell my care team before I take this medication? They need to know if you have any of these conditions: cancer heart disease high blood pressure history of blood clots history of stroke low levels of folate, iron, or vitamin B12 in the blood seizures an unusual or allergic reaction to erythropoietin, albumin, benzyl alcohol, hamster proteins, other medicines, foods, dyes, or preservatives pregnant or trying to get pregnant breast-feeding How should I use this medication? This medicine is for injection into a vein or under the skin. It is usually given by a health care professional in a hospital or clinic setting. If you get this medicine at home, you will be taught how to prepare and give this medicine. Use exactly as directed. Take your medicine at regular intervals. Do not take your medicine more often than directed. It is important that you put your used needles and syringes in a special sharps container. Do not put them in a trash can. If you do not have a sharps container, call your pharmacist or healthcare provider to get one. A special MedGuide will be given to you by the pharmacist with each prescription and refill. Be sure to read this information carefully each time. Talk to your pediatrician regarding the use of this medicine in children. While this drug may be prescribed for selected conditions, precautions do apply. Overdosage: If you think you have taken too much of this medicine contact a poison control center or emergency  room at once. NOTE: This medicine is only for you. Do not share this medicine with others. What if I miss a dose? If you miss a dose, take it as soon as you can. If it is almost time for your next dose, take only that dose. Do not take double or extra doses. What may interact with this medication? Interactions have not been studied. This list may not describe all possible interactions. Give your health care provider a list of all the medicines, herbs, non-prescription drugs, or dietary supplements you use. Also tell them if you smoke, drink alcohol, or use illegal drugs. Some items may interact with your medicine. What should I watch for while using this medication? Your condition will be monitored carefully while you are receiving this medicine. You may need blood work done while you are taking this medicine. This medicine may cause a decrease in vitamin B6. You should make sure that you get enough vitamin B6 while you are taking this medicine. Discuss the foods you eat and the vitamins you take with your health care professional. What side effects may I notice from receiving this medication? Side effects that you should report to your doctor or health care professional as soon as possible: allergic reactions like skin rash, itching or hives, swelling of the face, lips, or tongue seizures signs and symptoms of a blood clot such as breathing problems; changes in vision; chest pain; severe, sudden headache; pain, swelling, warmth in the leg; trouble speaking; sudden numbness or weakness of the face, arm or leg signs and symptoms of a stroke like   changes in vision; confusion; trouble speaking or understanding; severe headaches; sudden numbness or weakness of the face, arm or leg; trouble walking; dizziness; loss of balance or coordination Side effects that usually do not require medical attention (report to your doctor or health care professional if they continue or are  bothersome): chills cough dizziness fever headaches joint pain muscle cramps muscle pain nausea, vomiting pain, redness, or irritation at site where injected This list may not describe all possible side effects. Call your doctor for medical advice about side effects. You may report side effects to FDA at 1-800-FDA-1088. Where should I keep my medication? Keep out of the reach of children. Store in a refrigerator between 2 and 8 degrees C (36 and 46 degrees F). Do not freeze or shake. Throw away any unused portion if using a single-dose vial. Multi-dose vials can be kept in the refrigerator for up to 21 days after the initial dose. Throw away unused medicine. NOTE: This sheet is a summary. It may not cover all possible information. If you have questions about this medicine, talk to your doctor, pharmacist, or health care provider.  2022 Elsevier/Gold Standard (2017-03-23 08:35:19)  

## 2021-02-25 ENCOUNTER — Encounter: Payer: Self-pay | Admitting: *Deleted

## 2021-02-25 LAB — T4: T4, Total: 9.4 ug/dL (ref 4.5–12.0)

## 2021-02-25 LAB — TSH: TSH: 1.176 u[IU]/mL (ref 0.308–3.960)

## 2021-02-25 NOTE — Progress Notes (Signed)
Patient's daughter, [name], calling with concerns. Patient has been very low energy since leaving the clinic yesterday. She has diarrhea and a low grade temp of 1004. No other signs of infection. No pain. They have been pushing fluids, but not sure what else needs to be done.  Spoke with Dr Marin Olp. He would like patient to take imodium per package instructions to attempt to stop diarrhea. If patient becomes lethargic, diarrhea worsens or temp increases, patient will need to be brought to the ED.   Patient currently has no follow up appointment. Dr Marin Olp will review patient chart and place request.   Reviewed all the above with [name]. She understands.   Oncology Nurse Navigator Documentation  Oncology Nurse Navigator Flowsheets 02/25/2021  Abnormal Finding Date -  Planned Course of Treatment -  Phase of Treatment -  Chemotherapy Actual Start Date: -  Navigator Follow Up Date: -  Navigator Follow Up Reason: -  Navigator Location [facility]  Referral Date to RadOnc/MedOnc -  Navigator Encounter Type Telephone  Telephone Symptom Mgt;Incoming Call  Treatment Initiated Date -  Patient Visit Type MedOnc  Treatment Phase Active Tx  Barriers/Navigation Needs Coordination of Care;Education  Education Pain/ Symptom Management  Interventions Education;Coordination of Care;Psycho-Social Support  Acuity Level 2-Minimal Needs (1-2 Barriers Identified)  Referrals -  Coordination of Care Appts  Education Method Verbal;Teach-back  Support Groups/Services Friends and Family  Time Spent with Patient 30

## 2021-03-01 ENCOUNTER — Telehealth: Payer: Self-pay | Admitting: *Deleted

## 2021-03-01 ENCOUNTER — Encounter: Payer: Self-pay | Admitting: *Deleted

## 2021-03-01 NOTE — Progress Notes (Signed)
Patient has no follow up with this office. She was due treatment when she was hospitalized on 02/17/21. Spoke to Dr Marin Olp who requests that patient come in to be seen this Thursday or Friday for MD and her next cycle. Message sent to scheduling.   Oncology Nurse Navigator Documentation  Oncology Nurse Navigator Flowsheets 03/01/2021  Abnormal Finding Date -  Planned Course of Treatment -  Phase of Treatment -  Chemotherapy Actual Start Date: -  Navigator Follow Up Date: 03/03/2021  Navigator Follow Up Reason: Follow-up Appointment;Chemotherapy  Navigator Location [facility]  Referral Date to RadOnc/MedOnc -  Navigator Encounter Type Appt/Treatment Plan Review  Telephone -  Treatment Initiated Date -  Patient Visit Type MedOnc  Treatment Phase Active Tx  Barriers/Navigation Needs Coordination of Care;Education  Education -  Interventions Coordination of Care  Acuity Level 2-Minimal Needs (1-2 Barriers Identified)  Referrals -  Coordination of Care Appts  Education Method -  Support Groups/Services Friends and Family  Time Spent with Patient 30

## 2021-03-01 NOTE — Telephone Encounter (Signed)
Per secure chat - patient will be here 03/03/21 @ 8:00 a.m. - daughter confirmed.

## 2021-03-03 ENCOUNTER — Inpatient Hospital Stay: Payer: Medicare PPO

## 2021-03-03 ENCOUNTER — Ambulatory Visit: Payer: Medicare PPO

## 2021-03-03 ENCOUNTER — Encounter: Payer: Self-pay | Admitting: Hematology & Oncology

## 2021-03-03 ENCOUNTER — Inpatient Hospital Stay (HOSPITAL_BASED_OUTPATIENT_CLINIC_OR_DEPARTMENT_OTHER): Payer: Medicare PPO | Admitting: Hematology & Oncology

## 2021-03-03 ENCOUNTER — Telehealth: Payer: Self-pay | Admitting: *Deleted

## 2021-03-03 ENCOUNTER — Inpatient Hospital Stay: Payer: Medicare PPO | Attending: Hematology & Oncology

## 2021-03-03 ENCOUNTER — Encounter: Payer: Self-pay | Admitting: *Deleted

## 2021-03-03 ENCOUNTER — Other Ambulatory Visit: Payer: Self-pay

## 2021-03-03 VITALS — BP 143/86 | HR 71 | Temp 97.8°F | Resp 18 | Wt 144.0 lb

## 2021-03-03 DIAGNOSIS — Z789 Other specified health status: Secondary | ICD-10-CM

## 2021-03-03 DIAGNOSIS — IMO0001 Reserved for inherently not codable concepts without codable children: Secondary | ICD-10-CM

## 2021-03-03 DIAGNOSIS — C23 Malignant neoplasm of gallbladder: Secondary | ICD-10-CM | POA: Diagnosis present

## 2021-03-03 DIAGNOSIS — C779 Secondary and unspecified malignant neoplasm of lymph node, unspecified: Secondary | ICD-10-CM | POA: Insufficient documentation

## 2021-03-03 DIAGNOSIS — D500 Iron deficiency anemia secondary to blood loss (chronic): Secondary | ICD-10-CM | POA: Insufficient documentation

## 2021-03-03 DIAGNOSIS — C787 Secondary malignant neoplasm of liver and intrahepatic bile duct: Secondary | ICD-10-CM | POA: Insufficient documentation

## 2021-03-03 DIAGNOSIS — Z79899 Other long term (current) drug therapy: Secondary | ICD-10-CM | POA: Insufficient documentation

## 2021-03-03 LAB — COMPREHENSIVE METABOLIC PANEL
ALT: 7 U/L (ref 0–44)
AST: 13 U/L — ABNORMAL LOW (ref 15–41)
Albumin: 3.4 g/dL — ABNORMAL LOW (ref 3.5–5.0)
Alkaline Phosphatase: 71 U/L (ref 38–126)
Anion gap: 8 (ref 5–15)
BUN: 44 mg/dL — ABNORMAL HIGH (ref 8–23)
CO2: 25 mmol/L (ref 22–32)
Calcium: 9.1 mg/dL (ref 8.9–10.3)
Chloride: 103 mmol/L (ref 98–111)
Creatinine, Ser: 2.32 mg/dL — ABNORMAL HIGH (ref 0.44–1.00)
GFR, Estimated: 21 mL/min — ABNORMAL LOW (ref >60)
Glucose, Bld: 97 mg/dL (ref 70–99)
Potassium: 4.2 mmol/L (ref 3.5–5.1)
Sodium: 136 mmol/L (ref 135–145)
Total Bilirubin: 0.5 mg/dL (ref 0.3–1.2)
Total Protein: 5.8 g/dL — ABNORMAL LOW (ref 6.5–8.1)

## 2021-03-03 LAB — CBC WITH DIFFERENTIAL/PLATELET
Abs Immature Granulocytes: 0.03 10*3/uL (ref 0.00–0.07)
Basophils Absolute: 0 10*3/uL (ref 0.0–0.1)
Basophils Relative: 0 %
Eosinophils Absolute: 0 10*3/uL (ref 0.0–0.5)
Eosinophils Relative: 1 %
HCT: 25.7 % — ABNORMAL LOW (ref 36.0–46.0)
Hemoglobin: 7.8 g/dL — ABNORMAL LOW (ref 12.0–15.0)
Immature Granulocytes: 1 %
Lymphocytes Relative: 11 %
Lymphs Abs: 0.4 10*3/uL — ABNORMAL LOW (ref 0.7–4.0)
MCH: 32.9 pg (ref 26.0–34.0)
MCHC: 30.4 g/dL (ref 30.0–36.0)
MCV: 108.4 fL — ABNORMAL HIGH (ref 80.0–100.0)
Monocytes Absolute: 0.3 10*3/uL (ref 0.1–1.0)
Monocytes Relative: 8 %
Neutro Abs: 3.1 10*3/uL (ref 1.7–7.7)
Neutrophils Relative %: 79 %
Platelets: 181 10*3/uL (ref 150–400)
RBC: 2.37 MIL/uL — ABNORMAL LOW (ref 3.87–5.11)
RDW: 27.4 % — ABNORMAL HIGH (ref 11.5–15.5)
WBC: 3.8 10*3/uL — ABNORMAL LOW (ref 4.0–10.5)
nRBC: 0.8 % — ABNORMAL HIGH (ref 0.0–0.2)

## 2021-03-03 LAB — TSH: TSH: 1.743 u[IU]/mL (ref 0.308–3.960)

## 2021-03-03 NOTE — Progress Notes (Signed)
Patient treatment will be held today. Spoke to patient and her daughter, Vanessa Garrett, in treatment room. Vanessa Garrett states she is feeling better since her hospitalization, but still weak. She understands that she needs to try to increase her dietary intake. Patient will be rescheduled to next week.   Oncology Nurse Navigator Documentation  Oncology Nurse Navigator Flowsheets 03/03/2021  Abnormal Finding Date -  Planned Course of Treatment -  Phase of Treatment -  Chemotherapy Actual Start Date: -  Navigator Follow Up Date: 03/31/2021  Navigator Follow Up Reason: Follow-up Appointment;Chemotherapy  Navigator Location [facility]  Referral Date to RadOnc/MedOnc -  Navigator Encounter Type Treatment  Telephone -  Treatment Initiated Date -  Patient Visit Type MedOnc  Treatment Phase Active Tx  Barriers/Navigation Needs Coordination of Care;Education  Education -  Interventions Psycho-Social Support  Acuity Level 2-Minimal Needs (1-2 Barriers Identified)  Referrals -  Coordination of Care -  Education Method -  Support Groups/Services Friends and Family  Time Spent with Patient 15

## 2021-03-03 NOTE — Patient Instructions (Signed)

## 2021-03-03 NOTE — Progress Notes (Signed)
Hematology and Oncology Follow Up Visit  Vanessa Garrett [id number] [birth year] 80 y.o. 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   Interim History:  Vanessa Garrett is here today for follow-up and treatment.  She actually is hospitalized recently.  She had pancytopenia.  Actively this is from the chemotherapy.  As such, we are going to drop off the day #8 of each cycle at this point.  She was incredibly anemic.  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  We have given her Retacrit daily.  Her hemoglobin is slowly trending upward.  She has also received some IV iron.  She is still not eating all that well.  I think this is really a problem.  We really have to get her nutrition better.  If not, then it is hard to say how much improvement we will see with her blood count.  Her last MRI was done on 02/12/2021.  This did not show decrease in her liver lesions.  Have to believe that a lot of this is because of the addition of Durvalumab.  She does not complain of any pain.  She has had no issues with nausea or vomiting.  There has been no fever.  She has had no cough.  Unfortunately, she is not gone up to [place].  She is from [place].  She likes to go up there to visit family.  Currently, her performance status is ECOG 2.    Medications:  Allergies as of 03/03/2021   No Known Allergies      Medication List        Accurate as of March 03, 2021  8:49 AM. If you have any questions, ask your nurse or doctor.          dexamethasone 4 MG  tablet Commonly known as: DECADRON Take 2 tablets (8 mg total) by mouth daily. Start the day after carboplatin chemotherapy for 3 days.   dronabinol 2.5 MG capsule Commonly known as: MARINOL Take 1 capsule (2.5 mg total) by mouth 2 (two) times daily before a meal.   Generlac 10 GM/15ML Soln Generic drug: lactulose (encephalopathy) Take 10 g by mouth daily as needed (constipation).   lidocaine-prilocaine cream Commonly known as: EMLA Apply to affected area once   megestrol 20 MG tablet Commonly known as: MEGACE Take 1 tablet (20 mg total) by mouth 2 (two) times daily.   metoprolol tartrate 25 MG tablet Commonly known as: LOPRESSOR TAKE 1 TABLET(25 MG) BY MOUTH TWICE DAILY   NON FORMULARY Take 2 tablets by mouth daily. Mega Food - Blood Builder   NON FORMULARY 1.25 mg every other day. Beet Root Powder   potassium chloride SA 20 MEQ tablet Commonly known as: KLOR-CON TAKE 1 TABLET BY MOUTH EVERY DAY   Retacrit 10000 UNIT/ML injection Generic drug: epoetin alfa-epbx Inject 1 mL (10,000 Units total) into the skin 3 (three) times a week.   S.S.S. TONIC PO Take 45 mLs by mouth every other day.   triamterene-hydrochlorothiazide 75-50 MG tablet Commonly known as: MAXZIDE Take 1  tablet by mouth daily.        Allergies: No Known Allergies  Past Medical History, Surgical history, Social history, and Family History were reviewed and updated.  Review of Systems: Review of Systems  Constitutional:  Positive for malaise/fatigue and weight loss.  HENT: Negative.    Eyes: Negative.   Respiratory: Negative.    Cardiovascular:  Positive for leg swelling.  Gastrointestinal:  Positive for nausea.  Genitourinary: Negative.   Musculoskeletal: Negative.   Skin: Negative.   Neurological: Negative.   Endo/Heme/Allergies: Negative.   Psychiatric/Behavioral: Negative.    Marland Kitchen   Physical Exam:  weight is 144 lb (65.3 kg). Her tympanic temperature is 97.8 F (36.6 C). Her blood  pressure is 143/86 (abnormal) and her pulse is 71. Her respiration is 18 and oxygen saturation is 99%.   Wt Readings from Last 3 Encounters:  03/03/21 144 lb (65.3 kg)  02/16/21 149 lb 8 oz (67.8 kg)  01/28/21 156 lb 0.6 oz (70.8 kg)    Physical Exam Vitals reviewed.  HENT:     Head: Normocephalic and atraumatic.  Eyes:     Pupils: Pupils are equal, round, and reactive to light.  Cardiovascular:     Rate and Rhythm: Normal rate and regular rhythm.     Heart sounds: Normal heart sounds.  Pulmonary:     Effort: Pulmonary effort is normal.     Breath sounds: Normal breath sounds.  Abdominal:     General: Bowel sounds are normal.     Palpations: Abdomen is soft.  Musculoskeletal:        General: No tenderness or deformity. Normal range of motion.     Cervical back: Normal range of motion.     Comments: Her extremities does show some swelling in the legs.  There might be a little bit more swelling in the right leg than in the left leg.  Lymphadenopathy:     Cervical: No cervical adenopathy.  Skin:    General: Skin is warm and dry.     Findings: No erythema or rash.  Neurological:     Mental Status: She is alert and oriented to person, place, and time.  Psychiatric:        Behavior: Behavior normal.        Thought Content: Thought content normal.        Judgment: Judgment normal.     Lab Results  Component Value Date   WBC 3.8 (L) 03/03/2021   HGB 7.8 (L) 03/03/2021   HCT 25.7 (L) 03/03/2021   MCV 108.4 (H) 03/03/2021   PLT 181 03/03/2021   Lab Results  Component Value Date   FERRITIN 1,731 (H) 02/17/2021   IRON 45 02/17/2021   TIBC 212 (L) 02/17/2021   UIBC 167 02/17/2021   IRONPCTSAT 21 02/17/2021   Lab Results  Component Value Date   RETICCTPCT 1.4 02/17/2021   RBC 2.37 (L) 03/03/2021   No results found for: KPAFRELGTCHN, LAMBDASER, KAPLAMBRATIO No results found for: IGGSERUM, IGA, IGMSERUM No results found for: TOTALPROTELP, ALBUMINELP, A1GS, A2GS, BETS,  BETA2SER, GAMS, MSPIKE, SPEI   Chemistry      Component Value Date/Time   NA 132 (L) 02/24/2021 1101   K 3.9 02/24/2021 1101   CL 99 02/24/2021 1101   CO2 26 02/24/2021 1101   BUN 31 (H) 02/24/2021 1101   CREATININE 1.81 (H) 02/24/2021 1101   CREATININE 1.82 (H) 02/04/2021 1004      Component Value Date/Time   CALCIUM 9.2 02/24/2021 1101  ALKPHOS 125 02/24/2021 1101   AST 22 02/24/2021 1101   AST 18 02/04/2021 1004   ALT 11 02/24/2021 1101   ALT 17 02/04/2021 1004   BILITOT 0.4 02/24/2021 1101   BILITOT 0.4 02/04/2021 1004       Impression and Plan: Vanessa Garrett is a very pleasant 80 yo African American female with stage IV adenocarcinoma of the gallbladder.   We will not treat her today.  I think she still needs about a week off from treatment.  I would like to see her appetite improve.  I think this will be helpful.  Hopefully she will improve her appetite.  I know her family is doing a great job.  They are very proactive.  They are incredibly diligent with trying to improve her quality of life.  Again, we will have her next treatment in a week.  I will plan to see her back myself in about a month.  Again were going to hold day 8 of therapy because of her blood counts dropping.  She will still do the Retacrit daily.  I really have to try to get her hemoglobin above 10 if possible.   Volanda Napoleon, MD 7/7/20228:49 AM

## 2021-03-03 NOTE — Telephone Encounter (Signed)
Per 03/03/21 los gave daughter upcoming appointments - daughter confirmed - view mychart

## 2021-03-04 LAB — T4: T4, Total: 8.2 ug/dL (ref 4.5–12.0)

## 2021-03-08 ENCOUNTER — Telehealth: Payer: Self-pay | Admitting: Dietician

## 2021-03-08 ENCOUNTER — Encounter: Payer: Self-pay | Admitting: *Deleted

## 2021-03-08 ENCOUNTER — Other Ambulatory Visit: Payer: Self-pay | Admitting: *Deleted

## 2021-03-08 DIAGNOSIS — D500 Iron deficiency anemia secondary to blood loss (chronic): Secondary | ICD-10-CM

## 2021-03-08 DIAGNOSIS — IMO0001 Reserved for inherently not codable concepts without codable children: Secondary | ICD-10-CM

## 2021-03-08 DIAGNOSIS — Z789 Other specified health status: Secondary | ICD-10-CM

## 2021-03-08 DIAGNOSIS — C23 Malignant neoplasm of gallbladder: Secondary | ICD-10-CM

## 2021-03-08 DIAGNOSIS — D631 Anemia in chronic kidney disease: Secondary | ICD-10-CM

## 2021-03-08 MED ORDER — RETACRIT 10000 UNIT/ML IJ SOLN: 10000.0000 [IU] | Freq: Every day | INTRAMUSCULAR | 2 refills | Status: DC

## 2021-03-08 NOTE — Progress Notes (Signed)
Received a call from patient's daughter, Vanessa Garrett. She has two questions/needs.   Dr Marin Olp instructed patient to administer Retacrit daily at home. Prescription was not changed, and patient is about to be out of drug. New prescription, with new directions, sent to [facility].   Vanessa Garrett also asks about her mom's decreased renal function. She knows Dr Marin Olp encouraged increased fluid and oral intake but she'd worried the renal function will continue to decline. She wants to know if her mother would be a candidate for dialysis.   Spoke to Dr Marin Olp. He doesn't believe that patient is a dialysis candidate and would like patient and daughter to continue to focus on nutrition and fluid intake.   Above shared with [name].   Oncology Nurse Navigator Documentation  Oncology Nurse Navigator Flowsheets 03/08/2021  Abnormal Finding Date -  Planned Course of Treatment -  Phase of Treatment -  Chemotherapy Actual Start Date: -  Navigator Follow Up Date: 03/31/2021  Navigator Follow Up Reason: Follow-up Appointment;Chemotherapy  Navigator Location [facility]  Referral Date to RadOnc/MedOnc -  Navigator Encounter Type Telephone  Telephone Education;Incoming Call  Treatment Initiated Date -  Patient Visit Type MedOnc  Treatment Phase Active Tx  Barriers/Navigation Needs Coordination of Care;Education  Education Other  Interventions Education;Psycho-Social Support  Acuity Level 2-Minimal Needs (1-2 Barriers Identified)  Referrals -  Coordination of Care -  Education Method Verbal  Support Groups/Services Friends and Family  Time Spent with Patient 15

## 2021-03-08 NOTE — Telephone Encounter (Signed)
Scheduled appointment per 07/12 sch msg. Patient is aware. 

## 2021-03-10 ENCOUNTER — Other Ambulatory Visit: Payer: Self-pay | Admitting: Family

## 2021-03-10 ENCOUNTER — Other Ambulatory Visit: Payer: Self-pay

## 2021-03-10 ENCOUNTER — Ambulatory Visit: Payer: Medicare PPO

## 2021-03-10 ENCOUNTER — Inpatient Hospital Stay: Payer: Medicare PPO

## 2021-03-10 ENCOUNTER — Other Ambulatory Visit: Payer: Medicare PPO

## 2021-03-10 ENCOUNTER — Encounter: Payer: Self-pay | Admitting: *Deleted

## 2021-03-10 VITALS — BP 137/65 | HR 62 | Temp 97.9°F | Resp 17

## 2021-03-10 DIAGNOSIS — C23 Malignant neoplasm of gallbladder: Secondary | ICD-10-CM

## 2021-03-10 DIAGNOSIS — C221 Intrahepatic bile duct carcinoma: Secondary | ICD-10-CM

## 2021-03-10 DIAGNOSIS — K909 Intestinal malabsorption, unspecified: Secondary | ICD-10-CM

## 2021-03-10 DIAGNOSIS — D500 Iron deficiency anemia secondary to blood loss (chronic): Secondary | ICD-10-CM

## 2021-03-10 LAB — CBC WITH DIFFERENTIAL/PLATELET
Abs Immature Granulocytes: 0.03 10*3/uL (ref 0.00–0.07)
Basophils Absolute: 0 10*3/uL (ref 0.0–0.1)
Basophils Relative: 1 %
Eosinophils Absolute: 0.1 10*3/uL (ref 0.0–0.5)
Eosinophils Relative: 3 %
HCT: 34.3 % — ABNORMAL LOW (ref 36.0–46.0)
Hemoglobin: 10.4 g/dL — ABNORMAL LOW (ref 12.0–15.0)
Immature Granulocytes: 1 %
Lymphocytes Relative: 15 %
Lymphs Abs: 0.6 10*3/uL — ABNORMAL LOW (ref 0.7–4.0)
MCH: 33.1 pg (ref 26.0–34.0)
MCHC: 30.3 g/dL (ref 30.0–36.0)
MCV: 109.2 fL — ABNORMAL HIGH (ref 80.0–100.0)
Monocytes Absolute: 0.7 10*3/uL (ref 0.1–1.0)
Monocytes Relative: 16 %
Neutro Abs: 2.6 10*3/uL (ref 1.7–7.7)
Neutrophils Relative %: 64 %
Platelets: 212 10*3/uL (ref 150–400)
RBC: 3.14 MIL/uL — ABNORMAL LOW (ref 3.87–5.11)
RDW: 22.2 % — ABNORMAL HIGH (ref 11.5–15.5)
WBC: 4 10*3/uL (ref 4.0–10.5)
nRBC: 0 % (ref 0.0–0.2)

## 2021-03-10 LAB — COMPREHENSIVE METABOLIC PANEL
ALT: 7 U/L (ref 0–44)
AST: 15 U/L (ref 15–41)
Albumin: 3.5 g/dL (ref 3.5–5.0)
Alkaline Phosphatase: 71 U/L (ref 38–126)
Anion gap: 8 (ref 5–15)
BUN: 41 mg/dL — ABNORMAL HIGH (ref 8–23)
CO2: 27 mmol/L (ref 22–32)
Calcium: 9.2 mg/dL (ref 8.9–10.3)
Chloride: 98 mmol/L (ref 98–111)
Creatinine, Ser: 2.23 mg/dL — ABNORMAL HIGH (ref 0.44–1.00)
GFR, Estimated: 22 mL/min — ABNORMAL LOW (ref >60)
Glucose, Bld: 137 mg/dL — ABNORMAL HIGH (ref 70–99)
Potassium: 3.8 mmol/L (ref 3.5–5.1)
Sodium: 133 mmol/L — ABNORMAL LOW (ref 135–145)
Total Bilirubin: 0.4 mg/dL (ref 0.3–1.2)
Total Protein: 6.1 g/dL — ABNORMAL LOW (ref 6.5–8.1)

## 2021-03-10 MED ORDER — SODIUM CHLORIDE 0.9% FLUSH
10.0000 mL | Freq: Once | INTRAVENOUS | Status: AC | PRN
  Administered 2021-03-10: 10 mL
  Filled 2021-03-10: qty 10

## 2021-03-10 MED ORDER — HEPARIN SOD (PORK) LOCK FLUSH 100 UNIT/ML IV SOLN
500.0000 [IU] | Freq: Once | INTRAVENOUS | Status: AC | PRN
  Administered 2021-03-10: 500 [IU]
  Filled 2021-03-10: qty 5

## 2021-03-10 NOTE — Progress Notes (Signed)
Pt and son stated that they did not want to receive chemotherapy today. Per son they would like to request a referral to a nephrologist. Per son pt kidney function is worsening. Pt and son did want to receive lab work today. Pt ambulatory to flush room for port access. Per son pt has been very lethargic at home and rarely getting out of bed. This RN stated that with her disease process this was a natural progression of her disease. Reviewed pt labs with son and pt and per request oncology navigator will place referral. Reviewed with son and pt that referrals may take up to a week to hear back from. Reviewed plan of care and reviewed pt hgb is 10.4. Pt and son verbalized understanding plan of care. No further questions and pt left with son ambulatory.

## 2021-03-10 NOTE — Patient Instructions (Signed)
Implanted Port Home Guide An implanted port is a device that is placed under the skin. It is usually placed in the chest. The device can be used to give IV medicine, to take blood, or for dialysis. You may have an implanted port if: You need IV medicine that would be irritating to the small veins in your hands or arms. You need IV medicines, such as antibiotics, for a long period of time. You need IV nutrition for a long period of time. You need dialysis. When you have a port, your health care provider can choose to use the port instead of veins in your arms for these procedures. You may have fewer limitations when using a port than you would if you used other types of long-term IVs, and you will likely be able to return to normal activities afteryour incision heals. An implanted port has two main parts: Reservoir. The reservoir is the part where a needle is inserted to give medicines or draw blood. The reservoir is round. After it is placed, it appears as a small, raised area under your skin. Catheter. The catheter is a thin, flexible tube that connects the reservoir to a vein. Medicine that is inserted into the reservoir goes into the catheter and then into the vein. How is my port accessed? To access your port: A numbing cream may be placed on the skin over the port site. Your health care provider will put on a mask and sterile gloves. The skin over your port will be cleaned carefully with a germ-killing soap and allowed to dry. Your health care provider will gently pinch the port and insert a needle into it. Your health care provider will check for a blood return to make sure the port is in the vein and is not clogged. If your port needs to remain accessed to get medicine continuously (constant infusion), your health care provider will place a clear bandage (dressing) over the needle site. The dressing and needle will need to be changed every week, or as told by your health care provider. What  is flushing? Flushing helps keep the port from getting clogged. Follow instructions from your health care provider about how and when to flush the port. Ports are usually flushed with saline solution or a medicine called heparin. The need for flushing will depend on how the port is used: If the port is only used from time to time to give medicines or draw blood, the port may need to be flushed: Before and after medicines have been given. Before and after blood has been drawn. As part of routine maintenance. Flushing may be recommended every 4-6 weeks. If a constant infusion is running, the port may not need to be flushed. Throw away any syringes in a disposal container that is meant for sharp items (sharps container). You can buy a sharps container from a pharmacy, or you can make one by using an empty hard plastic bottle with a cover. How long will my port stay implanted? The port can stay in for as long as your health care provider thinks it is needed. When it is time for the port to come out, a surgery will be done to remove it. The surgery will be similar to the procedure that was done to putthe port in. Follow these instructions at home:  Flush your port as told by your health care provider. If you need an infusion over several days, follow instructions from your health care provider about how to take   care of your port site. Make sure you: Wash your hands with soap and water before you change your dressing. If soap and water are not available, use alcohol-based hand sanitizer. Change your dressing as told by your health care provider. Place any used dressings or infusion bags into a plastic bag. Throw that bag in the trash. Keep the dressing that covers the needle clean and dry. Do not get it wet. Do not use scissors or sharp objects near the tube. Keep the tube clamped, unless it is being used. Check your port site every day for signs of infection. Check for: Redness, swelling, or  pain. Fluid or blood. Pus or a bad smell. Protect the skin around the port site. Avoid wearing bra straps that rub or irritate the site. Protect the skin around your port from seat belts. Place a soft pad over your chest if needed. Bathe or shower as told by your health care provider. The site may get wet as long as you are not actively receiving an infusion. Return to your normal activities as told by your health care provider. Ask your health care provider what activities are safe for you. Carry a medical alert card or wear a medical alert bracelet at all times. This will let health care providers know that you have an implanted port in case of an emergency. Get help right away if: You have redness, swelling, or pain at the port site. You have fluid or blood coming from your port site. You have pus or a bad smell coming from the port site. You have a fever. Summary Implanted ports are usually placed in the chest for long-term IV access. Follow instructions from your health care provider about flushing the port and changing bandages (dressings). Take care of the area around your port by avoiding clothing that puts pressure on the area, and by watching for signs of infection. Protect the skin around your port from seat belts. Place a soft pad over your chest if needed. Get help right away if you have a fever or you have redness, swelling, pain, drainage, or a bad smell at the port site. This information is not intended to replace advice given to you by your health care provider. Make sure you discuss any questions you have with your healthcare provider. Document Revised: 12/29/2019 Document Reviewed: 12/29/2019 Elsevier Patient Education  2022 Elsevier Inc.  

## 2021-03-10 NOTE — Progress Notes (Signed)
Patient and son here for consideration of treatment. Per the son, patient is still too weak to consider treatment. They do no want her treated today. They do want her labs drawn, and request a referral to nephrology to discuss possible dialysis.   Labs drawn and hemoglobin has recovered nicely. Her creatinine is still elevated, although slightly decreased from last week. Nephrology consult placed per family request.   Oncology Nurse Navigator Documentation  Oncology Nurse Navigator Flowsheets 03/10/2021  Abnormal Finding Date -  Planned Course of Treatment -  Phase of Treatment -  Chemotherapy Actual Start Date: -  Navigator Follow Up Date: 03/31/2021  Navigator Follow Up Reason: Follow-up Appointment;Chemotherapy  Navigator Location [facility]  Referral Date to RadOnc/MedOnc -  Navigator Encounter Type Treatment  Telephone -  Treatment Initiated Date -  Patient Visit Type MedOnc  Treatment Phase Active Tx  Barriers/Navigation Needs Coordination of Care;Education  Education -  Interventions Referrals  Acuity Level 2-Minimal Needs (1-2 Barriers Identified)  Referrals Other  Coordination of Care -  Education Method -  Support Groups/Services Friends and Family  Time Spent with Patient 30

## 2021-03-11 LAB — T4: T4, Total: 8.5 ug/dL (ref 4.5–12.0)

## 2021-03-11 LAB — TSH: TSH: 1.022 u[IU]/mL (ref 0.308–3.960)

## 2021-03-14 ENCOUNTER — Telehealth: Payer: Self-pay | Admitting: *Deleted

## 2021-03-14 NOTE — Telephone Encounter (Signed)
Call received from patient's daughter stating that pt.'s pharmacy is requesting further information regarding pt.'s Retacrit prescription.  Call placed to [place] and further information given for pt.'s PA. PA pending for 24-48 hrs per [facility] representative.

## 2021-03-16 ENCOUNTER — Encounter: Payer: Medicare PPO | Admitting: Dietician

## 2021-03-16 ENCOUNTER — Telehealth: Payer: Self-pay | Admitting: Dietician

## 2021-03-16 NOTE — Telephone Encounter (Signed)
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 She is receiving carboplatin/gemzar and Durvalumab. Patient is followed by Dr. Marin Olp  Past medical history of anemia (Jehovah's witness does not wish to receive blood products), CKD stage IV, HTN, iron malabsorption. -Hospital admission 6/22-6/27 for pancytopenia  Spoke with daughter [name]) via telephone this morning. She reports patient is currently resting. [name] reports appetite comes and goes, usually eats a few bites of meals. Patient is taking appetite stimulant twice/day, [name] is unsure if this is helping but giving to patient as prescribed. She is asking about Elderberry Tonic for increasing appetite. Patient ate a fairly good breakfast this morning, recalls 3/4 bowl of grits sweetened with sugar, toast, orange juice. Yesterday patient had container of applesauce and glass of water for breakfast, bite of fish, 1/4 sweet potato, spoonful of mixed vegetables for lunch, few spoonfuls of pork/beans for dinner. She has been drinking 2 Boost High Protein (250 kcal, 20 g protein) twice daily, but lately patient has only been drinking one.   Nutrition Focused Physical Exam: unable to complete   Medications: Marinol, Decadron, Lactulose, Megace, Klor-con   Labs: 7/14 - Hgb 10.4, Glucose 137, Na 133, BUN 41, Cr 2.23   Anthropometrics: Last weight 144 lb on 7/07 decreased 5 lbs (3.3%) from 149 lb 8 oz on 6/22 decreased 12 lbs (7.7%) from 156 lb 0.6 oz on 6/3. This is significant.   Height: 5'1" Weight: 65.3 kg  UBW: 165 lb (07/2020) BMI: 27.21   NUTRITION DIAGNOSIS: Unintentional weight loss related to cancer and associated treatments as evidenced by poor appetite and 12 lb (7.7%) weight loss in 6 weeks which is significant for time frame     INTERVENTION:  Encouraged small frequent "mini" meals/snacks that are high in  calories, high in protein Discussed strategies for poor appetite, setting alarm every 2-2 1/2 hours for meal time reminders, maximizing calories/protein to make the most of every bite -will mail handout Continue taking appetite stimulant as prescribed Continue drinking 2 oral nutrition supplements/day, recommended switching to Ensure Plus/equivalent for added calories - will mail Ensure coupons Will mail high calorie, high protein recipes and snack ideas Discussed strategies for fatigue - will mail handout Discussed anemia, educated on foods with iron, folate, B12, vit C - will mail handout Contact information provided   MONITORING, EVALUATION, GOAL: Patient will tolerate increased calories and protein to minimize further weight loss   Next Visit: Tuesday August 16 via telephone (pt aware)

## 2021-03-21 ENCOUNTER — Other Ambulatory Visit: Payer: Self-pay | Admitting: *Deleted

## 2021-03-21 MED ORDER — DRONABINOL 2.5 MG PO CAPS: 2.5000 mg | ORAL_CAPSULE | Freq: Two times a day (BID) | ORAL | 0 refills | Status: DC

## 2021-03-21 MED ORDER — TRIAMTERENE-HCTZ 75-50 MG PO TABS: 1.0000 | ORAL_TABLET | Freq: Every day | ORAL | 4 refills | Status: DC

## 2021-03-31 ENCOUNTER — Inpatient Hospital Stay: Payer: Medicare PPO

## 2021-03-31 ENCOUNTER — Other Ambulatory Visit: Payer: Self-pay

## 2021-03-31 ENCOUNTER — Other Ambulatory Visit: Payer: Medicare PPO

## 2021-03-31 ENCOUNTER — Inpatient Hospital Stay (HOSPITAL_BASED_OUTPATIENT_CLINIC_OR_DEPARTMENT_OTHER): Payer: Medicare PPO | Admitting: Hematology & Oncology

## 2021-03-31 ENCOUNTER — Ambulatory Visit: Payer: Medicare PPO | Admitting: Hematology & Oncology

## 2021-03-31 ENCOUNTER — Encounter: Payer: Self-pay | Admitting: Hematology & Oncology

## 2021-03-31 ENCOUNTER — Inpatient Hospital Stay: Payer: Medicare PPO | Attending: Hematology & Oncology

## 2021-03-31 ENCOUNTER — Ambulatory Visit: Payer: Medicare PPO

## 2021-03-31 VITALS — BP 118/71 | HR 63 | Temp 97.9°F | Resp 18 | Wt 142.0 lb

## 2021-03-31 DIAGNOSIS — C23 Malignant neoplasm of gallbladder: Secondary | ICD-10-CM | POA: Diagnosis not present

## 2021-03-31 DIAGNOSIS — N289 Disorder of kidney and ureter, unspecified: Secondary | ICD-10-CM | POA: Diagnosis not present

## 2021-03-31 DIAGNOSIS — Z5112 Encounter for antineoplastic immunotherapy: Secondary | ICD-10-CM | POA: Diagnosis not present

## 2021-03-31 DIAGNOSIS — IMO0001 Reserved for inherently not codable concepts without codable children: Secondary | ICD-10-CM

## 2021-03-31 DIAGNOSIS — D500 Iron deficiency anemia secondary to blood loss (chronic): Secondary | ICD-10-CM | POA: Insufficient documentation

## 2021-03-31 DIAGNOSIS — C779 Secondary and unspecified malignant neoplasm of lymph node, unspecified: Secondary | ICD-10-CM | POA: Insufficient documentation

## 2021-03-31 DIAGNOSIS — R160 Hepatomegaly, not elsewhere classified: Secondary | ICD-10-CM

## 2021-03-31 DIAGNOSIS — D631 Anemia in chronic kidney disease: Secondary | ICD-10-CM | POA: Insufficient documentation

## 2021-03-31 DIAGNOSIS — Z79899 Other long term (current) drug therapy: Secondary | ICD-10-CM | POA: Diagnosis not present

## 2021-03-31 DIAGNOSIS — Z5111 Encounter for antineoplastic chemotherapy: Secondary | ICD-10-CM | POA: Diagnosis present

## 2021-03-31 DIAGNOSIS — Z789 Other specified health status: Secondary | ICD-10-CM

## 2021-03-31 DIAGNOSIS — C787 Secondary malignant neoplasm of liver and intrahepatic bile duct: Secondary | ICD-10-CM | POA: Diagnosis not present

## 2021-03-31 LAB — CBC WITH DIFFERENTIAL (CANCER CENTER ONLY)
Abs Immature Granulocytes: 0.01 10*3/uL (ref 0.00–0.07)
Basophils Absolute: 0 10*3/uL (ref 0.0–0.1)
Basophils Relative: 1 %
Eosinophils Absolute: 0 10*3/uL (ref 0.0–0.5)
Eosinophils Relative: 1 %
HCT: 42.2 % (ref 36.0–46.0)
Hemoglobin: 13 g/dL (ref 12.0–15.0)
Immature Granulocytes: 0 %
Lymphocytes Relative: 16 %
Lymphs Abs: 0.6 10*3/uL — ABNORMAL LOW (ref 0.7–4.0)
MCH: 31.4 pg (ref 26.0–34.0)
MCHC: 30.8 g/dL (ref 30.0–36.0)
MCV: 101.9 fL — ABNORMAL HIGH (ref 80.0–100.0)
Monocytes Absolute: 0.4 10*3/uL (ref 0.1–1.0)
Monocytes Relative: 11 %
Neutro Abs: 2.7 10*3/uL (ref 1.7–7.7)
Neutrophils Relative %: 71 %
Platelet Count: 114 10*3/uL — ABNORMAL LOW (ref 150–400)
RBC: 4.14 MIL/uL (ref 3.87–5.11)
RDW: 16.4 % — ABNORMAL HIGH (ref 11.5–15.5)
WBC Count: 3.7 10*3/uL — ABNORMAL LOW (ref 4.0–10.5)
nRBC: 0 % (ref 0.0–0.2)

## 2021-03-31 LAB — CMP (CANCER CENTER ONLY)
ALT: 7 U/L (ref 0–44)
AST: 14 U/L — ABNORMAL LOW (ref 15–41)
Albumin: 3.5 g/dL (ref 3.5–5.0)
Alkaline Phosphatase: 77 U/L (ref 38–126)
Anion gap: 9 (ref 5–15)
BUN: 39 mg/dL — ABNORMAL HIGH (ref 8–23)
CO2: 24 mmol/L (ref 22–32)
Calcium: 9.2 mg/dL (ref 8.9–10.3)
Chloride: 101 mmol/L (ref 98–111)
Creatinine: 2.25 mg/dL — ABNORMAL HIGH (ref 0.44–1.00)
GFR, Estimated: 22 mL/min — ABNORMAL LOW (ref >60)
Glucose, Bld: 117 mg/dL — ABNORMAL HIGH (ref 70–99)
Potassium: 4.2 mmol/L (ref 3.5–5.1)
Sodium: 134 mmol/L — ABNORMAL LOW (ref 135–145)
Total Bilirubin: 0.3 mg/dL (ref 0.3–1.2)
Total Protein: 6.2 g/dL — ABNORMAL LOW (ref 6.5–8.1)

## 2021-03-31 LAB — IRON AND TIBC
Iron: 32 ug/dL — ABNORMAL LOW (ref 41–142)
Saturation Ratios: 15 % — ABNORMAL LOW (ref 21–57)
TIBC: 219 ug/dL — ABNORMAL LOW (ref 236–444)
UIBC: 187 ug/dL (ref 120–384)

## 2021-03-31 LAB — RETICULOCYTES
Immature Retic Fract: 27.1 % — ABNORMAL HIGH (ref 2.3–15.9)
RBC.: 4.13 MIL/uL (ref 3.87–5.11)
Retic Count, Absolute: 109 10*3/uL (ref 19.0–186.0)
Retic Ct Pct: 2.6 % (ref 0.4–3.1)

## 2021-03-31 LAB — FERRITIN: Ferritin: 1070 ng/mL — ABNORMAL HIGH (ref 11–307)

## 2021-03-31 LAB — TSH: TSH: 1.174 u[IU]/mL (ref 0.308–3.960)

## 2021-03-31 MED ORDER — FAMOTIDINE 20 MG IN NS 100 ML IVPB
20.0000 mg | Freq: Once | INTRAVENOUS | Status: AC
  Administered 2021-03-31: 20 mg via INTRAVENOUS
  Filled 2021-03-31: qty 100

## 2021-03-31 MED ORDER — SODIUM CHLORIDE 0.9 % IV SOLN
Freq: Once | INTRAVENOUS | Status: DC
  Filled 2021-03-31: qty 250

## 2021-03-31 MED ORDER — SODIUM CHLORIDE 0.9 % IV SOLN
1500.0000 mg | Freq: Once | INTRAVENOUS | Status: AC
  Administered 2021-03-31: 1500 mg via INTRAVENOUS
  Filled 2021-03-31: qty 30

## 2021-03-31 MED ORDER — DIPHENHYDRAMINE HCL 50 MG/ML IJ SOLN
25.0000 mg | Freq: Once | INTRAMUSCULAR | Status: AC
  Administered 2021-03-31: 25 mg via INTRAVENOUS

## 2021-03-31 MED ORDER — SODIUM CHLORIDE 0.9 % IV SOLN
150.0000 mg | Freq: Once | INTRAVENOUS | Status: AC
  Administered 2021-03-31: 150 mg via INTRAVENOUS
  Filled 2021-03-31: qty 150

## 2021-03-31 MED ORDER — SODIUM CHLORIDE 0.9 % IV SOLN
800.0000 mg/m2 | Freq: Once | INTRAVENOUS | Status: AC
  Administered 2021-03-31: 1368 mg via INTRAVENOUS
  Filled 2021-03-31: qty 26.3

## 2021-03-31 MED ORDER — SODIUM CHLORIDE 0.9 % IV SOLN
Freq: Once | INTRAVENOUS | Status: AC
  Filled 2021-03-31: qty 250

## 2021-03-31 MED ORDER — DIPHENHYDRAMINE HCL 50 MG/ML IJ SOLN
INTRAMUSCULAR | Status: AC
  Filled 2021-03-31: qty 1

## 2021-03-31 MED ORDER — SODIUM CHLORIDE 0.9% FLUSH
10.0000 mL | INTRAVENOUS | Status: DC | PRN
  Administered 2021-03-31: 10 mL
  Filled 2021-03-31: qty 10

## 2021-03-31 MED ORDER — SODIUM CHLORIDE 0.9 % IV SOLN
10.0000 mg | Freq: Once | INTRAVENOUS | Status: AC
  Administered 2021-03-31: 10 mg via INTRAVENOUS
  Filled 2021-03-31: qty 10

## 2021-03-31 MED ORDER — SODIUM CHLORIDE 0.9 % IV SOLN
233.0000 mg | Freq: Once | INTRAVENOUS | Status: AC
  Administered 2021-03-31: 230 mg via INTRAVENOUS
  Filled 2021-03-31: qty 23

## 2021-03-31 MED ORDER — PALONOSETRON HCL INJECTION 0.25 MG/5ML
0.2500 mg | Freq: Once | INTRAVENOUS | Status: AC
  Administered 2021-03-31: 0.25 mg via INTRAVENOUS

## 2021-03-31 MED ORDER — HEPARIN SOD (PORK) LOCK FLUSH 100 UNIT/ML IV SOLN
500.0000 [IU] | Freq: Once | INTRAVENOUS | Status: AC | PRN
  Administered 2021-03-31: 500 [IU]
  Filled 2021-03-31: qty 5

## 2021-03-31 MED ORDER — PALONOSETRON HCL INJECTION 0.25 MG/5ML
INTRAVENOUS | Status: AC
  Filled 2021-03-31: qty 5

## 2021-03-31 NOTE — Patient Instructions (Signed)
Tunneled Central Venous Catheter Flushing Guide It is important to flush your tunneled central venous catheter each time you use it, both before and after you use it. Flushing your catheter will help prevent it from clogging. What are the risks? Risks may include: Infection. Air getting into the catheter and bloodstream. Supplies needed: A clean pair of gloves. A disinfecting wipe. Use an alcohol wipe, chlorhexidine wipe, or iodine wipe as told by your health care provider. A 10 mL syringe that has been prefilled with saline solution. An empty 10 mL syringe, if a substance called heparin was injected into your catheter. How to flush your catheter When you flush your catheter, make sure you follow any specific instructions from your health care provider or the manufacturer. These are general guidelines. Flushing your catheter before use If there is heparin in your catheter: Wash your hands with soap and water. Put on gloves. Scrub the injection cap for a minimum of 15 seconds with a disinfecting wipe. Unclamp the catheter. Attach the empty syringe to the injection cap. Pull the syringe plunger back and withdraw 10 mL of blood. Place the syringe into an appropriate waste container. Scrub the injection cap for 15 seconds with a disinfecting wipe. Attach the prefilled syringe to the injection cap. Flush the catheter by pushing the plunger forward until all the liquid from the syringe is in the catheter. Remove the syringe from the injection cap. Clamp the catheter. If there is no heparin in your catheter: Wash your hands with soap and water. Put on gloves. Scrub the injection cap for 15 seconds with a disinfecting wipe. Unclamp the catheter. Attach the prefilled syringe to the injection cap. Flush the catheter by pushing the plunger forward until 5 mL of the liquid from the syringe is in the catheter. Pull back on the syringe until you see blood in the catheter. If you have been asked  to collect any blood, follow your health care provider's instructions. Otherwise, flush the catheter with the rest of the solution from the syringe. Remove the syringe from the injection cap. Clamp the catheter.  Flushing your catheter after use Wash your hands with soap and water. Put on gloves. Scrub the injection cap for 15 seconds with a disinfecting wipe. Unclamp the catheter. Attach the prefilled syringe to the injection cap. Flush the catheter by pushing the plunger forward until all of the liquid from the syringe is in the catheter. Remove the syringe from the injection cap. Clamp the catheter. Problems and solutions If blood cannot be completely cleared from the injection cap, you may need to have the injection cap replaced. If the catheter is difficult to flush, use the pulsing method. The pulsing method involves pushing only a few milliliters of solution into the catheter at a time and pausing between pushes. If you do not see blood in the catheter when you pull back on the syringe, change your body position, such as by raising your arms above your head. Take a deep breath and cough. Then, pull back on the syringe. If you still do not see blood, flush the catheter with a small amount of solution. Then, change positions again and take a breath or cough. Pull back on the syringe again. If you still do not see blood, finish flushing the catheter and contact your health care provider. Do not use your catheter until your health care provider says it is okay. General tips Have someone help you flush your catheter, if possible. Do not force fluid   through your catheter. Do not use a syringe that is larger or smaller than 10 mL. Using a smaller syringe can make the catheter burst. Do not use your catheter without flushing it first if it has heparin in it. Contact a health care provider if: You cannot see any blood in the catheter when you flush it before using it. Your catheter is difficult  to flush. Get help right away if: You cannot flush the catheter. The catheter leaks when you flush it or when there is fluid in it. There are cracks or breaks in the catheter. Summary It is important to flush your tunneled central venous catheter each time you use it, both before and after you use it. Scrub the injection cap for 15 seconds with a disinfecting wipe before and after you flush it. When you flush your catheter, make sure you follow any specific instructions from your health care provider or the manufacturer. Get help right away if you cannot flush the catheter. This information is not intended to replace advice given to you by your health care provider. Make sure you discuss any questions you have with your health care provider. Document Revised: 10/23/2019 Document Reviewed: 10/30/2018 [employer] Patient Education  2022 [employer]  

## 2021-03-31 NOTE — Patient Instructions (Addendum)
Carboplatin injection What is this medication? CARBOPLATIN (KAR boe pla tin) is a chemotherapy drug. It targets fast dividing cells, like cancer cells, and causes these cells to die. This medicine is usedto treat ovarian cancer and many other cancers. This medicine may be used for other purposes; ask your health care provider orpharmacist if you have questions. COMMON BRAND NAME(S): Paraplatin What should I tell my care team before I take this medication? They need to know if you have any of these conditions: blood disorders hearing problems kidney disease recent or ongoing radiation therapy an unusual or allergic reaction to carboplatin, cisplatin, other chemotherapy, other medicines, foods, dyes, or preservatives pregnant or trying to get pregnant breast-feeding How should I use this medication? This drug is usually given as an infusion into a vein. It is administered in [facility] or clinic by a specially trained health care professional. Talk to your pediatrician regarding the use of this medicine in children.Special care may be needed. Overdosage: If you think you have taken too much of this medicine contact apoison control center or emergency room at once. NOTE: This medicine is only for you. Do not share this medicine with others. What if I miss a dose? It is important not to miss a dose. Call your doctor or health careprofessional if you are unable to keep an appointment. What may interact with this medication? medicines for seizures medicines to increase blood counts like filgrastim, pegfilgrastim, sargramostim some antibiotics like amikacin, gentamicin, neomycin, streptomycin, tobramycin vaccines Talk to your doctor or health care professional before taking any of thesemedicines: acetaminophen aspirin ibuprofen ketoprofen naproxen This list may not describe all possible interactions. Give your health care provider a list of all the medicines, herbs, non-prescription drugs, or  dietary supplements you use. Also tell them if you smoke, drink alcohol, or use illegaldrugs. Some items may interact with your medicine. What should I watch for while using this medication? Your condition will be monitored carefully while you are receiving this medicine. You will need important blood work done while you are taking thismedicine. This drug may make you feel generally unwell. This is not uncommon, as chemotherapy can affect healthy cells as well as cancer cells. Report any side effects. Continue your course of treatment even though you feel ill unless yourdoctor tells you to stop. In some cases, you may be given additional medicines to help with side effects.Follow all directions for their use. Call your doctor or health care professional for advice if you get a fever, chills or sore throat, or other symptoms of a cold or flu. Do not treat yourself. This drug decreases your body's ability to fight infections. Try toavoid being around people who are sick. This medicine may increase your risk to bruise or bleed. Call your doctor orhealth care professional if you notice any unusual bleeding. Be careful brushing and flossing your teeth or using a toothpick because you may get an infection or bleed more easily. If you have any dental work done,tell your dentist you are receiving this medicine. Avoid taking products that contain aspirin, acetaminophen, ibuprofen, naproxen, or ketoprofen unless instructed by your doctor. These medicines may hide afever. Do not become pregnant while taking this medicine. Women should inform their doctor if they wish to become pregnant or think they might be pregnant. There is a potential for serious side effects to an unborn child. Talk to your health care professional or pharmacist for more information. Do not breast-feed aninfant while taking this medicine. What side effects may  I notice from receiving this medication? Side effects that you should report to your  doctor or health care professionalas soon as possible: allergic reactions like skin rash, itching or hives, swelling of the face, lips, or tongue signs of infection - fever or chills, cough, sore throat, pain or difficulty passing urine signs of decreased platelets or bleeding - bruising, pinpoint red spots on the skin, black, tarry stools, nosebleeds signs of decreased red blood cells - unusually weak or tired, fainting spells, lightheadedness breathing problems changes in hearing changes in vision chest pain high blood pressure low blood counts - This drug may decrease the number of white blood cells, red blood cells and platelets. You may be at increased risk for infections and bleeding. nausea and vomiting pain, swelling, redness or irritation at the injection site pain, tingling, numbness in the hands or feet problems with balance, talking, walking trouble passing urine or change in the amount of urine Side effects that usually do not require medical attention (report to yourdoctor or health care professional if they continue or are bothersome): hair loss loss of appetite metallic taste in the mouth or changes in taste This list may not describe all possible side effects. Call your doctor for medical advice about side effects. You may report side effects to FDA at1-800-FDA-1088. Where should I keep my medication? This drug is given in a hospital or clinic and will not be stored at home. NOTE: This sheet is a summary. It may not cover all possible information. If you have questions about this medicine, talk to your doctor, pharmacist, orhealth care provider.  2022 Elsevier/Gold Standard (2007-11-19 14:38:05) Gemcitabine injection What is this medication? GEMCITABINE (jem SYE ta been) is a chemotherapy drug. This medicine is used to treat many types of cancer like breast cancer, lung cancer, pancreatic cancer,and ovarian cancer. This medicine may be used for other purposes; ask your  health care provider orpharmacist if you have questions. COMMON BRAND NAME(S): Gemzar, Infugem What should I tell my care team before I take this medication? They need to know if you have any of these conditions: blood disorders infection kidney disease liver disease lung or breathing disease, like asthma recent or ongoing radiation therapy an unusual or allergic reaction to gemcitabine, other chemotherapy, other medicines, foods, dyes, or preservatives pregnant or trying to get pregnant breast-feeding How should I use this medication? This drug is given as an infusion into a vein. It is administered in a hospitalor clinic by a specially trained health care professional. Talk to your pediatrician regarding the use of this medicine in children.Special care may be needed. Overdosage: If you think you have taken too much of this medicine contact apoison control center or emergency room at once. NOTE: This medicine is only for you. Do not share this medicine with others. What if I miss a dose? It is important not to miss your dose. Call your doctor or health careprofessional if you are unable to keep an appointment. What may interact with this medication? medicines to increase blood counts like filgrastim, pegfilgrastim, sargramostim some other chemotherapy drugs like cisplatin vaccines Talk to your doctor or health care professional before taking any of thesemedicines: acetaminophen aspirin ibuprofen ketoprofen naproxen This list may not describe all possible interactions. Give your health care provider a list of all the medicines, herbs, non-prescription drugs, or dietary supplements you use. Also tell them if you smoke, drink alcohol, or use illegaldrugs. Some items may interact with your medicine. What should I watch for  while using this medication? Visit your doctor for checks on your progress. This drug may make you feel generally unwell. This is not uncommon, as chemotherapy can  affect healthy cells as well as cancer cells. Report any side effects. Continue your course oftreatment even though you feel ill unless your doctor tells you to stop. In some cases, you may be given additional medicines to help with side effects.Follow all directions for their use. Call your doctor or health care professional for advice if you get a fever, chills or sore throat, or other symptoms of a cold or flu. Do not treat yourself. This drug decreases your body's ability to fight infections. Try toavoid being around people who are sick. This medicine may increase your risk to bruise or bleed. Call your doctor orhealth care professional if you notice any unusual bleeding. Be careful brushing and flossing your teeth or using a toothpick because you may get an infection or bleed more easily. If you have any dental work done,tell your dentist you are receiving this medicine. Avoid taking products that contain aspirin, acetaminophen, ibuprofen, naproxen, or ketoprofen unless instructed by your doctor. These medicines may hide afever. Do not become pregnant while taking this medicine or for 6 months after stopping it. Women should inform their doctor if they wish to become pregnant or think they might be pregnant. Men should not father a child while taking this medicine and for 3 months after stopping it. There is a potential for serious side effects to an unborn child. Talk to your health care professional or pharmacist for more information. Do not breast-feed an infant while takingthis medicine or for at least 1 week after stopping it. Men should inform their doctors if they wish to father a child. This medicine may lower sperm counts. Talk with your doctor or health care professional ifyou are concerned about your fertility. What side effects may I notice from receiving this medication? Side effects that you should report to your doctor or health care professionalas soon as possible: allergic reactions  like skin rash, itching or hives, swelling of the face, lips, or tongue breathing problems pain, redness, or irritation at site where injected signs and symptoms of a dangerous change in heartbeat or heart rhythm like chest pain; dizziness; fast or irregular heartbeat; palpitations; feeling faint or lightheaded, falls; breathing problems signs of decreased platelets or bleeding - bruising, pinpoint red spots on the skin, black, tarry stools, blood in the urine signs of decreased red blood cells - unusually weak or tired, feeling faint or lightheaded, falls signs of infection - fever or chills, cough, sore throat, pain or difficulty passing urine signs and symptoms of kidney injury like trouble passing urine or change in the amount of urine signs and symptoms of liver injury like dark yellow or brown urine; general ill feeling or flu-like symptoms; light-colored stools; loss of appetite; nausea; right upper belly pain; unusually weak or tired; yellowing of the eyes or skin swelling of ankles, feet, hands Side effects that usually do not require medical attention (report to yourdoctor or health care professional if they continue or are bothersome): constipation diarrhea hair loss loss of appetite nausea rash vomiting This list may not describe all possible side effects. Call your doctor for medical advice about side effects. You may report side effects to FDA at1-800-FDA-1088. Where should I keep my medication? This drug is given in a hospital or clinic and will not be stored at home. NOTE: This sheet is a summary. It  may not cover all possible information. If you have questions about this medicine, talk to your doctor, pharmacist, orhealth care provider.  2022 Elsevier/Gold Standard (2017-11-07 18:06:11) Durvalumab injection What is this medication? DURVALUMAB (dur VAL ue mab) is a monoclonal antibody. It is used to treat lungcancer. This medicine may be used for other purposes; ask your  health care provider orpharmacist if you have questions. COMMON BRAND NAME(S): IMFINZI What should I tell my care team before I take this medication? They need to know if you have any of these conditions: autoimmune diseases like Crohn's disease, ulcerative colitis, or lupus have had or planning to have an allogeneic stem cell transplant (uses someone else's stem cells) history of organ transplant history of radiation to the chest nervous system problems like myasthenia gravis or Guillain-Barre syndrome an unusual or allergic reaction to durvalumab, other medicines, foods, dyes, or preservatives pregnant or trying to get pregnant breast-feeding How should I use this medication? This medicine is for infusion into a vein. It is given by a health careprofessional in a hospital or clinic setting. A special MedGuide will be given to you before each treatment. Be sure to readthis information carefully each time. Talk to your pediatrician regarding the use of this medicine in children.Special care may be needed. Overdosage: If you think you have taken too much of this medicine contact apoison control center or emergency room at once. NOTE: This medicine is only for you. Do not share this medicine with others. What if I miss a dose? It is important not to miss your dose. Call your doctor or health careprofessional if you are unable to keep an appointment. What may interact with this medication? Interactions have not been studied. This list may not describe all possible interactions. Give your health care provider a list of all the medicines, herbs, non-prescription drugs, or dietary supplements you use. Also tell them if you smoke, drink alcohol, or use illegaldrugs. Some items may interact with your medicine. What should I watch for while using this medication? This drug may make you feel generally unwell. Continue your course of treatmenteven though you feel ill unless your doctor tells you to  stop. You may need blood work done while you are taking this medicine. Do not become pregnant while taking this medicine or for 3 months after stopping it. Women should inform their doctor if they wish to become pregnant or think they might be pregnant. There is a potential for serious side effects to an unborn child. Talk to your health care professional or pharmacist for more information. Do not breast-feed an infant while taking this medicine orfor 3 months after stopping it. What side effects may I notice from receiving this medication? Side effects that you should report to your doctor or health care professionalas soon as possible: allergic reactions like skin rash, itching or hives, swelling of the face, lips, or tongue black, tarry stools bloody or watery diarrhea breathing problems change in emotions or moods change in sex drive changes in vision chest pain or chest tightness chills confusion cough facial flushing fever headache signs and symptoms of high blood sugar such as dizziness; dry mouth; dry skin; fruity breath; nausea; stomach pain; increased hunger or thirst; increased urination signs and symptoms of liver injury like dark yellow or brown urine; general ill feeling or flu-like symptoms; light-colored stools; loss of appetite; nausea; right upper belly pain; unusually weak or tired; yellowing of the eyes or skin stomach pain trouble passing urine or change in  the amount of urine weight gain or weight loss Side effects that usually do not require medical attention (report these toyour doctor or health care professional if they continue or are bothersome): bone pain constipation loss of appetite muscle pain nausea swelling of the ankles, feet, hands tiredness This list may not describe all possible side effects. Call your doctor for medical advice about side effects. You may report side effects to FDA at1-800-FDA-1088. Where should I keep my medication? This drug is  given in a hospital or clinic and will not be stored at home. NOTE: This sheet is a summary. It may not cover all possible information. If you have questions about this medicine, talk to your doctor, pharmacist, orhealth care provider.  2022 Elsevier/Gold Standard (2019-10-23 13:01:29) [facility]  Discharge Instructions: Thank you for choosing [facility] to provide your oncology and hematology care.   If you have a lab appointment with the [facility], please go directly to the [facility] and check in at the registration area.  Wear comfortable clothing and clothing appropriate for easy access to any Portacath or PICC line.   We strive to give you quality time with your provider. You may need to reschedule your appointment if you arrive late (15 or more minutes).  Arriving late affects you and other patients whose appointments are after yours.  Also, if you miss three or more appointments without notifying the office, you may be dismissed from the clinic at the provider's discretion.      For prescription refill requests, have your pharmacy contact our office and allow 72 hours for refills to be completed.    Today you received the following chemotherapy and/or immunotherapy agents carboplatin   To help prevent nausea and vomiting after your treatment, we encourage you to take your nausea medication as directed.  BELOW ARE SYMPTOMS THAT SHOULD BE REPORTED IMMEDIATELY: *FEVER GREATER THAN 100.4 F (38 C) OR HIGHER *CHILLS OR SWEATING *NAUSEA AND VOMITING THAT IS NOT CONTROLLED WITH YOUR NAUSEA MEDICATION *UNUSUAL SHORTNESS OF BREATH *UNUSUAL BRUISING OR BLEEDING *URINARY PROBLEMS (pain or burning when urinating, or frequent urination) *BOWEL PROBLEMS (unusual diarrhea, constipation, pain near the anus) TENDERNESS IN MOUTH AND THROAT WITH OR WITHOUT PRESENCE OF ULCERS (sore throat, sores in mouth, or a toothache) UNUSUAL RASH, SWELLING OR  PAIN  UNUSUAL VAGINAL DISCHARGE OR ITCHING   Items with * indicate a potential emergency and should be followed up as soon as possible or go to the Emergency Department if any problems should occur.  Please show the CHEMOTHERAPY ALERT CARD or IMMUNOTHERAPY ALERT CARD at check-in to the Emergency Department and triage nurse. Should you have questions after your visit or need to cancel or reschedule your appointment, please contact [facility]  [phone] and follow the prompts.  Office hours are 8:00 a.m. to 4:30 p.m. Monday - Friday. Please note that voicemails left after 4:00 p.m. may not be returned until the following business day.  We are closed weekends and major holidays. You have access to a nurse at all times for urgent questions. Please call the main number to the clinic [phone] and follow the prompts.  For any non-urgent questions, you may also contact your provider using MyChart. We now offer e-Visits for anyone 67 and older to request care online for non-urgent symptoms. For details visit [email or web address].   Also download the MyChart app! Go to the app store, search "MyChart", open the  app, select [facility], and log in with your MyChart username and password.  Due to Covid, a mask is required upon entering the hospital/clinic. If you do not have a mask, one will be given to you upon arrival. For doctor visits, patients may have 1 support person aged 15 or older with them. For treatment visits, patients cannot have anyone with them due to current Covid guidelines and our immunocompromised population. [facility]  Discharge Instructions: Thank you for choosing [facility] to provide your oncology and hematology care.   If you have a lab appointment with the [facility], please go directly to the [facility] and check in at the registration area.  Wear comfortable clothing and clothing appropriate for  easy access to any Portacath or PICC line.   We strive to give you quality time with your provider. You may need to reschedule your appointment if you arrive late (15 or more minutes).  Arriving late affects you and other patients whose appointments are after yours.  Also, if you miss three or more appointments without notifying the office, you may be dismissed from the clinic at the provider's discretion.      For prescription refill requests, have your pharmacy contact our office and allow 72 hours for refills to be completed.    Today you received the following chemotherapy and/or immunotherapy agents carboplatin, gemzar   To help prevent nausea and vomiting after your treatment, we encourage you to take your nausea medication as directed.  BELOW ARE SYMPTOMS THAT SHOULD BE REPORTED IMMEDIATELY: *FEVER GREATER THAN 100.4 F (38 C) OR HIGHER *CHILLS OR SWEATING *NAUSEA AND VOMITING THAT IS NOT CONTROLLED WITH YOUR NAUSEA MEDICATION *UNUSUAL SHORTNESS OF BREATH *UNUSUAL BRUISING OR BLEEDING *URINARY PROBLEMS (pain or burning when urinating, or frequent urination) *BOWEL PROBLEMS (unusual diarrhea, constipation, pain near the anus) TENDERNESS IN MOUTH AND THROAT WITH OR WITHOUT PRESENCE OF ULCERS (sore throat, sores in mouth, or a toothache) UNUSUAL RASH, SWELLING OR PAIN  UNUSUAL VAGINAL DISCHARGE OR ITCHING   Items with * indicate a potential emergency and should be followed up as soon as possible or go to the Emergency Department if any problems should occur.  Please show the CHEMOTHERAPY ALERT CARD or IMMUNOTHERAPY ALERT CARD at check-in to the Emergency Department and triage nurse. Should you have questions after your visit or need to cancel or reschedule your appointment, please contact [facility]  [phone] and follow the prompts.  Office hours are 8:00 a.m. to 4:30 p.m. Monday - Friday. Please note that voicemails left after 4:00 p.m. may not be  returned until the following business day.  We are closed weekends and major holidays. You have access to a nurse at all times for urgent questions. Please call the main number to the clinic [phone] and follow the prompts.  For any non-urgent questions, you may also contact your provider using MyChart. We now offer e-Visits for anyone 72 and older to request care online for non-urgent symptoms. For details visit [email or web address].   Also download the MyChart app! Go to the app store, search "MyChart", open the app, select [facility], and log in with your MyChart username and password.  Due to Covid, a mask is required upon entering the hospital/clinic. If you do not have a mask, one will be given to you upon arrival. For doctor visits, patients may have 1 support person aged 23 or older with them. For  treatment visits, patients cannot have anyone with them due to current Covid guidelines and our immunocompromised population.

## 2021-03-31 NOTE — Progress Notes (Signed)
Hematology and Oncology Follow Up Visit  Vanessa Garrett [id number] 07-19-1941 80 y.o. 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   Interim History:  Vanessa Garrett is here today for follow-up and treatment.  She has done incredibly well with the Retacrit.  Her hemoglobin is 13.  I must say I am very impressed with the Retacrit effect.  I told her son to have a Retacrit done at home only once or twice a week now.  She is not eating all that well.  We really have to try to get her nutrition up.  Hopefully, she will be able to eat a little bit better.  She says she will start making more what she likes to eat.  Her family is worried about her renal function.  I know she has renal insufficiency.  However, this is been relatively stable.  I am not really worried about this because she has no evidence of kidney failure.  I worry more about her cancer getting worse.  She has had no problems urinating.  There is no blood in the urine.  She has had no problems with diarrhea.  Her  leg swelling seems to be doing a little bit better.  I think this also will improve with the hemoglobin improving.    The last CA 19-9 was down to 1880.  She has had a very nice decrease with this.  I think some of this has to do with her being on the immunotherapy.  Her last iron studies back in June showed a ferritin of 1730 with an iron saturation of 21%.  Most of the ferritin elevation is secondary to her malignancy.  She has had no cough.  She has had no obvious shortness of breath.  I really think  that her quality of life seems to be doing a little bit better.  Currently, her performance status is ECOG 2.   Medications:  Allergies as of 03/31/2021       Reactions   Megace Es [megestrol Acetate] Swelling   Face swelling        Medication List        Accurate as of March 31, 2021 11:12 AM. If you have any questions, ask your nurse or doctor.          dexamethasone 4 MG tablet Commonly known as: DECADRON Take 2 tablets (8 mg total) by mouth daily. Start the day after carboplatin chemotherapy for 3 days.   dronabinol 2.5 MG capsule Commonly known as: MARINOL Take 1 capsule (2.5 mg total) by mouth 2 (two) times daily before a meal.   Generlac 10 GM/15ML Soln Generic drug: lactulose (encephalopathy) Take 10 g by mouth daily as needed (constipation).   lidocaine-prilocaine cream Commonly known as: EMLA Apply to affected area once   metoprolol tartrate 25 MG tablet Commonly known as: LOPRESSOR TAKE 1 TABLET(25 MG) BY MOUTH TWICE DAILY   NON FORMULARY Take 2 tablets by mouth daily. Mega Food - Blood Builder   NON FORMULARY 1.25 mg every other day. Beet Root Powder   potassium  chloride SA 20 MEQ tablet Commonly known as: KLOR-CON TAKE 1 TABLET BY MOUTH EVERY DAY   Retacrit 10000 UNIT/ML injection Generic drug: epoetin alfa-epbx Inject 1 mL (10,000 Units total) into the skin daily.   S.S.S. TONIC PO Take 45 mLs by mouth every other day.   triamterene-hydrochlorothiazide 75-50 MG tablet Commonly known as: MAXZIDE Take 1 tablet by mouth daily.        Allergies:  Allergies  Allergen Reactions   Megace Es [Megestrol Acetate] Swelling    Face swelling    Past Medical History, Surgical history, Social history, and Family History were reviewed and updated.  Review of Systems: Review of Systems  Constitutional:  Positive for malaise/fatigue and weight loss.  HENT: Negative.    Eyes: Negative.   Respiratory: Negative.    Cardiovascular:  Positive  for leg swelling.  Gastrointestinal:  Positive for nausea.  Genitourinary: Negative.   Musculoskeletal: Negative.   Skin: Negative.   Neurological: Negative.   Endo/Heme/Allergies: Negative.   Psychiatric/Behavioral: Negative.    Marland Kitchen   Physical Exam:  weight is 142 lb (64.4 kg). Her oral temperature is 97.9 F (36.6 C). Her blood pressure is 118/71 and her pulse is 63. Her respiration is 18 and oxygen saturation is 100%.   Wt Readings from Last 3 Encounters:  03/31/21 142 lb (64.4 kg)  03/03/21 144 lb (65.3 kg)  02/16/21 149 lb 8 oz (67.8 kg)    Physical Exam Vitals reviewed.  HENT:     Head: Normocephalic and atraumatic.  Eyes:     Pupils: Pupils are equal, round, and reactive to light.  Cardiovascular:     Rate and Rhythm: Normal rate and regular rhythm.     Heart sounds: Normal heart sounds.  Pulmonary:     Effort: Pulmonary effort is normal.     Breath sounds: Normal breath sounds.  Abdominal:     General: Bowel sounds are normal.     Palpations: Abdomen is soft.  Musculoskeletal:        General: No tenderness or deformity. Normal range of motion.     Cervical back: Normal range of motion.     Comments: Her extremities does show some swelling in the legs.  There might be a little bit more swelling in the right leg than in the left leg.  Lymphadenopathy:     Cervical: No cervical adenopathy.  Skin:    General: Skin is warm and dry.     Findings: No erythema or rash.  Neurological:     Mental Status: She is alert and oriented to person, place, and time.  Psychiatric:        Behavior: Behavior normal.        Thought Content: Thought content normal.        Judgment: Judgment normal.     Lab Results  Component Value Date   WBC 3.7 (L) 03/31/2021   HGB 13.0 03/31/2021   HCT 42.2 03/31/2021   MCV 101.9 (H) 03/31/2021   PLT 114 (L) 03/31/2021   Lab Results  Component Value Date   FERRITIN 1,731 (H) 02/17/2021   IRON 45 02/17/2021   TIBC 212 (L) 02/17/2021    UIBC 167 02/17/2021   IRONPCTSAT 21 02/17/2021   Lab Results  Component Value Date   RETICCTPCT 2.6 03/31/2021   RBC 4.13 03/31/2021   No results found for: KPAFRELGTCHN, LAMBDASER, KAPLAMBRATIO No results found for: IGGSERUM, IGA, IGMSERUM No results found for: TOTALPROTELP, ALBUMINELP, A1GS, A2GS, BETS, BETA2SER, [place], MSPIKE,  SPEI   Chemistry      Component Value Date/Time   NA 134 (L) 03/31/2021 0954   K 4.2 03/31/2021 0954   CL 101 03/31/2021 0954   CO2 24 03/31/2021 0954   BUN 39 (H) 03/31/2021 0954   CREATININE 2.25 (H) 03/31/2021 0954      Component Value Date/Time   CALCIUM 9.2 03/31/2021 0954   ALKPHOS 77 03/31/2021 0954   AST 14 (L) 03/31/2021 0954   ALT 7 03/31/2021 0954   BILITOT 0.3 03/31/2021 0954       Impression and Plan: Vanessa Garrett is a very pleasant 80 yo African American female with stage IV adenocarcinoma of the gallbladder.   I have adjusted her treatment protocol.  We are only doing day 1 of therapy not day 8.  I just do not think that her blood counts can manage a day 8 of treatment.  Again everything is all about quality of life.  Right now, her quality of life is doing pretty well.  I think a lot of this has to do with the fact that her hemoglobin has come up so nicely.  Again we are watching the kidney function.  I reassured her son that I just do not think that she was on any medication with respect to her chemotherapy that is known to have any adverse kidney effects.  I am sure that there is always case reports of adverse effects of chemotherapy with renal function.  We will go ahead with her 11th cycle today.  I do not think we need to do any scans on her for another month or so.  Last MRI was done back in June.    Volanda Napoleon, MD 8/4/202211:12 AM

## 2021-04-01 LAB — T4: T4, Total: 6.4 ug/dL (ref 4.5–12.0)

## 2021-04-01 LAB — CANCER ANTIGEN 19-9: CA 19-9: 73 U/mL — ABNORMAL HIGH (ref 0–35)

## 2021-04-05 ENCOUNTER — Encounter: Payer: Self-pay | Admitting: *Deleted

## 2021-04-05 NOTE — Progress Notes (Signed)
Patient resumed treatment at this visit.   Oncology Nurse Navigator Documentation  Oncology Nurse Navigator Flowsheets 04/05/2021  Abnormal Finding Date -  Planned Course of Treatment -  Phase of Treatment -  Chemotherapy Actual Start Date: -  Navigator Follow Up Date: 04/22/2021  Navigator Follow Up Reason: Follow-up Appointment;Chemotherapy  Navigator Location [facility]  Referral Date to RadOnc/MedOnc -  Navigator Encounter Type Appt/Treatment Plan Review  Telephone -  Treatment Initiated Date -  Patient Visit Type MedOnc  Treatment Phase Active Tx  Barriers/Navigation Needs Coordination of Care;Education  Education -  Interventions None Required  Acuity Level 2-Minimal Needs (1-2 Barriers Identified)  Referrals -  Coordination of Care -  Education Method -  Support Groups/Services Friends and Family  Time Spent with Patient 15

## 2021-04-12 ENCOUNTER — Telehealth: Payer: Self-pay | Admitting: Dietician

## 2021-04-12 ENCOUNTER — Inpatient Hospital Stay: Payer: Medicare PPO | Admitting: Dietician

## 2021-04-12 NOTE — Telephone Encounter (Signed)
Nutrition  Patient scheduled for nutrition follow-up via telephone today. Received voicemail from patient's daughter [name]). She reports patient is overall doing well, does not have nutrition concerns currently. Daughter  appreciative of handouts and coupons received in the mail, has found fact sheets helpful. [name] reports nutrition follow-up is not necessary at this time. Attempted to contact [name] via telephone to reschedule nutrition follow-up, however no answer. Voicemail full and unable to leave message. Patient and daughter have contact information.

## 2021-04-22 ENCOUNTER — Encounter: Payer: Self-pay | Admitting: *Deleted

## 2021-04-22 ENCOUNTER — Encounter: Payer: Self-pay | Admitting: Hematology & Oncology

## 2021-04-22 ENCOUNTER — Inpatient Hospital Stay: Payer: Medicare PPO

## 2021-04-22 ENCOUNTER — Telehealth: Payer: Self-pay | Admitting: *Deleted

## 2021-04-22 ENCOUNTER — Other Ambulatory Visit: Payer: Self-pay

## 2021-04-22 ENCOUNTER — Inpatient Hospital Stay (HOSPITAL_BASED_OUTPATIENT_CLINIC_OR_DEPARTMENT_OTHER): Payer: Medicare PPO | Admitting: Hematology & Oncology

## 2021-04-22 VITALS — BP 156/93 | HR 72 | Temp 98.2°F | Resp 18 | Wt 142.0 lb

## 2021-04-22 VITALS — BP 156/93 | HR 72 | Temp 98.2°F | Resp 18 | Ht 61.0 in | Wt 142.0 lb

## 2021-04-22 DIAGNOSIS — Z5112 Encounter for antineoplastic immunotherapy: Secondary | ICD-10-CM | POA: Diagnosis not present

## 2021-04-22 DIAGNOSIS — C23 Malignant neoplasm of gallbladder: Secondary | ICD-10-CM

## 2021-04-22 DIAGNOSIS — Z95828 Presence of other vascular implants and grafts: Secondary | ICD-10-CM

## 2021-04-22 LAB — IRON AND TIBC
Iron: 52 ug/dL (ref 41–142)
Saturation Ratios: 22 % (ref 21–57)
TIBC: 241 ug/dL (ref 236–444)
UIBC: 189 ug/dL (ref 120–384)

## 2021-04-22 LAB — COMPREHENSIVE METABOLIC PANEL
ALT: 9 U/L (ref 0–44)
AST: 18 U/L (ref 15–41)
Albumin: 3.7 g/dL (ref 3.5–5.0)
Alkaline Phosphatase: 99 U/L (ref 38–126)
Anion gap: 8 (ref 5–15)
BUN: 30 mg/dL — ABNORMAL HIGH (ref 8–23)
CO2: 27 mmol/L (ref 22–32)
Calcium: 9.5 mg/dL (ref 8.9–10.3)
Chloride: 99 mmol/L (ref 98–111)
Creatinine, Ser: 1.96 mg/dL — ABNORMAL HIGH (ref 0.44–1.00)
GFR, Estimated: 25 mL/min — ABNORMAL LOW (ref >60)
Glucose, Bld: 108 mg/dL — ABNORMAL HIGH (ref 70–99)
Potassium: 3.8 mmol/L (ref 3.5–5.1)
Sodium: 134 mmol/L — ABNORMAL LOW (ref 135–145)
Total Bilirubin: 0.4 mg/dL (ref 0.3–1.2)
Total Protein: 6.5 g/dL (ref 6.5–8.1)

## 2021-04-22 LAB — CBC WITH DIFFERENTIAL/PLATELET
Abs Immature Granulocytes: 0 10*3/uL (ref 0.00–0.07)
Basophils Absolute: 0 10*3/uL (ref 0.0–0.1)
Basophils Relative: 1 %
Eosinophils Absolute: 0 10*3/uL (ref 0.0–0.5)
Eosinophils Relative: 1 %
HCT: 39.3 % (ref 36.0–46.0)
Hemoglobin: 12.6 g/dL (ref 12.0–15.0)
Immature Granulocytes: 0 %
Lymphocytes Relative: 27 %
Lymphs Abs: 0.4 10*3/uL — ABNORMAL LOW (ref 0.7–4.0)
MCH: 31.9 pg (ref 26.0–34.0)
MCHC: 32.1 g/dL (ref 30.0–36.0)
MCV: 99.5 fL (ref 80.0–100.0)
Monocytes Absolute: 0.4 10*3/uL (ref 0.1–1.0)
Monocytes Relative: 25 %
Neutro Abs: 0.8 10*3/uL — ABNORMAL LOW (ref 1.7–7.7)
Neutrophils Relative %: 46 %
Platelets: 91 10*3/uL — ABNORMAL LOW (ref 150–400)
RBC: 3.95 MIL/uL (ref 3.87–5.11)
RDW: 17.7 % — ABNORMAL HIGH (ref 11.5–15.5)
WBC: 1.6 10*3/uL — ABNORMAL LOW (ref 4.0–10.5)
nRBC: 0 % (ref 0.0–0.2)

## 2021-04-22 LAB — TSH: TSH: 1.335 u[IU]/mL (ref 0.308–3.960)

## 2021-04-22 LAB — LACTATE DEHYDROGENASE: LDH: 210 U/L — ABNORMAL HIGH (ref 98–192)

## 2021-04-22 LAB — FERRITIN: Ferritin: 1207 ng/mL — ABNORMAL HIGH (ref 11–307)

## 2021-04-22 MED ORDER — SODIUM CHLORIDE 0.9% FLUSH
10.0000 mL | Freq: Once | INTRAVENOUS | Status: AC
Start: 2021-04-22 — End: 2021-04-22
  Administered 2021-04-22: 10 mL via INTRAVENOUS

## 2021-04-22 MED ORDER — HEPARIN SOD (PORK) LOCK FLUSH 100 UNIT/ML IV SOLN
500.0000 [IU] | Freq: Once | INTRAVENOUS | Status: AC
Start: 2021-04-22 — End: 2021-04-22
  Administered 2021-04-22: 500 [IU] via INTRAVENOUS

## 2021-04-22 NOTE — Progress Notes (Signed)
Patient not able to receive treatment today due to low white count. She will be reschedule in one weeks time.   Oncology Nurse Navigator Documentation  Oncology Nurse Navigator Flowsheets 04/22/2021  Abnormal Finding Date -  Planned Course of Treatment -  Phase of Treatment -  Chemotherapy Actual Start Date: -  Navigator Follow Up Date: 05/27/2021  Navigator Follow Up Reason: Follow-up Appointment  Navigator Location [facility]  Referral Date to RadOnc/MedOnc -  Navigator Encounter Type Appt/Treatment Plan Review  Telephone -  Treatment Initiated Date -  Patient Visit Type MedOnc  Treatment Phase Active Tx  Barriers/Navigation Needs Coordination of Care;Education  Education -  Interventions None Required  Acuity Level 2-Minimal Needs (1-2 Barriers Identified)  Referrals -  Coordination of Care -  Education Method -  Support Groups/Services Friends and Family  Time Spent with Patient 15

## 2021-04-22 NOTE — Progress Notes (Signed)
Hematology and Oncology Follow Up Visit  Vanessa Garrett [id number] 30-Sep-1940 80 y.o. 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   Interim History:  [name] is here today for follow-up and treatment.  Unfortunately, she will not be able to have treatment because her white cell count.  Her white cell count is too low.  She only has treatment once every 3 weeks.  We eliminated the day 8 treatment just because her blood counts cannot tolerate this.  Her CA 19-9 has done incredibly well.  Her last CA 19 have not was 56 which I have a hard time believing is real.  Will be interesting to see what the numbers this time.  Her renal function is improving.  She is eating okay.  She is having no problems with nausea or vomiting.  She is having no bleeding.  There is no cough.  She is done incredibly well with the Retacrit.  She is doing this twice a week at home.  Her hemoglobin is nice and high.  Her last iron levels back in early August showed a ferritin of 1000 iron saturation of 15%.    I really think that the addition of immunotherapy has made a big difference.  She is having no problems with pain.  There is no diarrhea.  She does have some leg swelling.  This is been chronic.  It has been a little challenging trying to manage the diuretics.  Currently, I would have to say her performance status is probably ECOG 2.     Medications:  Allergies as of 04/22/2021       Reactions   Megace Es [megestrol Acetate] Swelling   Face swelling        Medication List        Accurate  as of April 22, 2021  1:17 PM. If you have any questions, ask your nurse or doctor.          dexamethasone 4 MG tablet Commonly known as: DECADRON Take 2 tablets (8 mg total) by mouth daily. Start the day after carboplatin chemotherapy for 3 days.   dronabinol 2.5 MG capsule Commonly known as: MARINOL Take 1 capsule (2.5 mg total) by mouth 2 (two) times daily before a meal.   Generlac 10 GM/15ML Soln Generic drug: lactulose (encephalopathy) Take 10 g by mouth daily as needed (constipation).   lidocaine-prilocaine cream Commonly known as: EMLA Apply to affected area once   metoprolol tartrate 25 MG tablet Commonly known as: LOPRESSOR TAKE 1 TABLET(25 MG) BY MOUTH TWICE DAILY   NON FORMULARY Take 2 tablets by mouth daily. Mega Food - Blood Builder   NON FORMULARY 1.25 mg every other day. Beet Root Powder   potassium chloride SA 20 MEQ tablet Commonly known as: KLOR-CON TAKE 1 TABLET BY MOUTH EVERY DAY   Retacrit 10000 UNIT/ML injection Generic drug: epoetin alfa-epbx Inject 1 mL (10,000 Units total) into the skin daily.   S.S.S. TONIC PO Take 45 mLs by mouth every other day.   triamterene-hydrochlorothiazide 75-50 MG tablet Commonly known as: MAXZIDE Take 1  tablet by mouth daily.        Allergies:  Allergies  Allergen Reactions   Megace Es [Megestrol Acetate] Swelling    Face swelling    Past Medical History, Surgical history, Social history, and Family History were reviewed and updated.  Review of Systems: Review of Systems  Constitutional:  Positive for malaise/fatigue and weight loss.  HENT: Negative.    Eyes: Negative.   Respiratory: Negative.    Cardiovascular:  Positive for leg swelling.  Gastrointestinal:  Positive for nausea.  Genitourinary: Negative.   Musculoskeletal: Negative.   Skin: Negative.   Neurological: Negative.   Endo/Heme/Allergies: Negative.   Psychiatric/Behavioral: Negative.    Marland Kitchen   Physical Exam:  height is 5\' 1"   (1.549 m) and weight is 142 lb 0.6 oz (64.4 kg). Her oral temperature is 98.2 F (36.8 C). Her blood pressure is 156/93 (abnormal) and her pulse is 72. Her respiration is 18 and oxygen saturation is 100%.   Wt Readings from Last 3 Encounters:  04/22/21 142 lb 0.6 oz (64.4 kg)  04/22/21 142 lb 0.6 oz (64.4 kg)  03/31/21 142 lb (64.4 kg)    Physical Exam Vitals reviewed.  HENT:     Head: Normocephalic and atraumatic.  Eyes:     Pupils: Pupils are equal, round, and reactive to light.  Cardiovascular:     Rate and Rhythm: Normal rate and regular rhythm.     Heart sounds: Normal heart sounds.  Pulmonary:     Effort: Pulmonary effort is normal.     Breath sounds: Normal breath sounds.  Abdominal:     General: Bowel sounds are normal.     Palpations: Abdomen is soft.  Musculoskeletal:        General: No tenderness or deformity. Normal range of motion.     Cervical back: Normal range of motion.     Comments: Her extremities does show some swelling in the legs.  There might be a little bit more swelling in the right leg than in the left leg.  Lymphadenopathy:     Cervical: No cervical adenopathy.  Skin:    General: Skin is warm and dry.     Findings: No erythema or rash.  Neurological:     Mental Status: She is alert and oriented to person, place, and time.  Psychiatric:        Behavior: Behavior normal.        Thought Content: Thought content normal.        Judgment: Judgment normal.     Lab Results  Component Value Date   WBC 1.6 (L) 04/22/2021   HGB 12.6 04/22/2021   HCT 39.3 04/22/2021   MCV 99.5 04/22/2021   PLT 91 (L) 04/22/2021   Lab Results  Component Value Date   FERRITIN 1,070 (H) 03/31/2021   IRON 32 (L) 03/31/2021   TIBC 219 (L) 03/31/2021   UIBC 187 03/31/2021   IRONPCTSAT 15 (L) 03/31/2021   Lab Results  Component Value Date   RETICCTPCT 2.6 03/31/2021   RBC 3.95 04/22/2021   No results found for: KPAFRELGTCHN, LAMBDASER, KAPLAMBRATIO No results  found for: IGGSERUM, IGA, IGMSERUM No results found for: TOTALPROTELP, ALBUMINELP, A1GS, A2GS, BETS, BETA2SER, [place], MSPIKE, SPEI   Chemistry      Component Value Date/Time   NA 134 (L) 04/22/2021 0825   K 3.8 04/22/2021 0825   CL 99 04/22/2021 0825   CO2 27 04/22/2021 0825   BUN 30 (H) 04/22/2021 0825   CREATININE 1.96 (  H) 04/22/2021 0825   CREATININE 2.25 (H) 03/31/2021 0954      Component Value Date/Time   CALCIUM 9.5 04/22/2021 0825   ALKPHOS 99 04/22/2021 0825   AST 18 04/22/2021 0825   AST 14 (L) 03/31/2021 0954   ALT 9 04/22/2021 0825   ALT 7 03/31/2021 0954   BILITOT 0.4 04/22/2021 0825   BILITOT 0.3 03/31/2021 0954       Impression and Plan: Ms. Kindig is a very pleasant 80 yo African American female with stage IV adenocarcinoma of the gallbladder.   I have adjusted her treatment protocol.  We are only doing day 1 of therapy not day 8.  I just do not think that her blood counts can manage a day 8 of treatment.  Despite the above adjustment, we still cannot treat her.  Her white cell count is on the low side.  We may have to think about using a colony-stimulating factor to try to help with her white blood cell count.  We will treat her next week.  We will give her a week off.  Her blood count should be fine when we see her back.  Volanda Napoleon, MD 8/26/20221:17 PM

## 2021-04-22 NOTE — Telephone Encounter (Signed)
Per 04/22/21 LOS - gave upcoming appointments - confirmed

## 2021-04-22 NOTE — Addendum Note (Signed)
Addended by: Lucile Crater on: 04/22/2021 10:00 AM   Modules accepted: Orders

## 2021-04-22 NOTE — Progress Notes (Deleted)
Hematology and Oncology Follow Up Visit  Vanessa Garrett [id number] 1941/07/01 80 y.o. 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   Interim History:  Vanessa Garrett is here today for follow-up and treatment.  Unfortunately, she cannot have treatment today. {[id number]} she has done incredibly well with the Retacrit.  Her hemoglobin is 13.  I must say I am very impressed with the Retacrit effect.  I told her son to have a Retacrit done at home only once or twice a week now.  She is not eating all that well.  We really have to try to get her nutrition up.  Hopefully, she will be able to eat a little bit better.  She says she will start making more what she likes to eat.  Her family is worried about her renal function.  I know she has renal insufficiency.  However, this is been relatively stable.  I am not really worried about this because she has no evidence of kidney failure.  I worry more about her cancer getting worse.  She has had no problems urinating.  There is no blood in the urine.  She has had no problems with diarrhea.  Her  leg swelling seems to be doing a little bit better.  I think this also will improve with the hemoglobin improving.    The last CA 19-9 was down to 1880.  She has had a very nice decrease with this.  I think some of this has to do with her being on the immunotherapy.  Her last iron studies back in June showed a ferritin of 1730 with an iron saturation of 21%.  Most of the ferritin elevation is secondary to her malignancy.  She has had no cough.   She has had no obvious shortness of breath.  I really think that her quality of life seems to be doing a little bit better.  Currently, her performance status is ECOG 2.   Medications:  Allergies as of 04/22/2021       Reactions   Megace Es [megestrol Acetate] Swelling   Face swelling        Medication List        Accurate as of April 22, 2021  1:08 PM. If you have any questions, ask your nurse or doctor.          dexamethasone 4 MG tablet Commonly known as: DECADRON Take 2 tablets (8 mg total) by mouth daily. Start the day after carboplatin chemotherapy for 3 days.   dronabinol 2.5 MG capsule Commonly known as: MARINOL Take 1 capsule (2.5 mg total) by mouth 2 (two) times daily before a meal.   Generlac 10 GM/15ML Soln Generic drug: lactulose (encephalopathy) Take 10 g by mouth daily as needed (constipation).   lidocaine-prilocaine cream Commonly known as: EMLA Apply to affected area once   metoprolol tartrate 25 MG tablet Commonly known as: LOPRESSOR TAKE 1 TABLET(25 MG) BY MOUTH TWICE DAILY   NON FORMULARY Take 2 tablets by mouth daily. Mega Food - Blood Builder   NON FORMULARY 1.25 mg every  other day. Beet Root Powder   potassium chloride SA 20 MEQ tablet Commonly known as: KLOR-CON TAKE 1 TABLET BY MOUTH EVERY DAY   Retacrit 10000 UNIT/ML injection Generic drug: epoetin alfa-epbx Inject 1 mL (10,000 Units total) into the skin daily.   S.S.S. TONIC PO Take 45 mLs by mouth every other day.   triamterene-hydrochlorothiazide 75-50 MG tablet Commonly known as: MAXZIDE Take 1 tablet by mouth daily.        Allergies:  Allergies  Allergen Reactions   Megace Es [Megestrol Acetate] Swelling    Face swelling    Past Medical History, Surgical history, Social history, and Family History were reviewed and updated.  Review of Systems: Review of Systems  Constitutional:  Positive for malaise/fatigue and weight loss.  HENT: Negative.    Eyes:  Negative.   Respiratory: Negative.    Cardiovascular:  Positive for leg swelling.  Gastrointestinal:  Positive for nausea.  Genitourinary: Negative.   Musculoskeletal: Negative.   Skin: Negative.   Neurological: Negative.   Endo/Heme/Allergies: Negative.   Psychiatric/Behavioral: Negative.    Marland Kitchen   Physical Exam:  height is 5\' 1"  (1.549 m) and weight is 142 lb 0.6 oz (64.4 kg). Her oral temperature is 98.2 F (36.8 C). Her blood pressure is 156/93 (abnormal) and her pulse is 72. Her respiration is 18 and oxygen saturation is 100%.   Wt Readings from Last 3 Encounters:  04/22/21 142 lb 0.6 oz (64.4 kg)  04/22/21 142 lb 0.6 oz (64.4 kg)  03/31/21 142 lb (64.4 kg)    Physical Exam Vitals reviewed.  HENT:     Head: Normocephalic and atraumatic.  Eyes:     Pupils: Pupils are equal, round, and reactive to light.  Cardiovascular:     Rate and Rhythm: Normal rate and regular rhythm.     Heart sounds: Normal heart sounds.  Pulmonary:     Effort: Pulmonary effort is normal.     Breath sounds: Normal breath sounds.  Abdominal:     General: Bowel sounds are normal.     Palpations: Abdomen is soft.  Musculoskeletal:        General: No tenderness or deformity. Normal range of motion.     Cervical back: Normal range of motion.     Comments: Her extremities does show some swelling in the legs.  There might be a little bit more swelling in the right leg than in the left leg.  Lymphadenopathy:     Cervical: No cervical adenopathy.  Skin:    General: Skin is warm and dry.     Findings: No erythema or rash.  Neurological:     Mental Status: She is alert and oriented to person, place, and time.  Psychiatric:        Behavior: Behavior normal.        Thought Content: Thought content normal.        Judgment: Judgment normal.     Lab Results  Component Value Date   WBC 1.6 (L) 04/22/2021   HGB 12.6 04/22/2021   HCT 39.3 04/22/2021   MCV 99.5 04/22/2021   PLT 91 (L) 04/22/2021    Lab Results  Component Value Date   FERRITIN 1,070 (H) 03/31/2021   IRON 32 (L) 03/31/2021   TIBC 219 (L) 03/31/2021   UIBC 187 03/31/2021   IRONPCTSAT 15 (L) 03/31/2021   Lab Results  Component Value Date   RETICCTPCT 2.6 03/31/2021   RBC 3.95 04/22/2021   No results found for: KPAFRELGTCHN,  LAMBDASER, KAPLAMBRATIO No results found for: IGGSERUM, IGA, IGMSERUM No results found for: Kathrynn Ducking, MSPIKE, SPEI   Chemistry      Component Value Date/Time   NA 134 (L) 04/22/2021 0825   K 3.8 04/22/2021 0825   CL 99 04/22/2021 0825   CO2 27 04/22/2021 0825   BUN 30 (H) 04/22/2021 0825   CREATININE 1.96 (H) 04/22/2021 0825   CREATININE 2.25 (H) 03/31/2021 0954      Component Value Date/Time   CALCIUM 9.5 04/22/2021 0825   ALKPHOS 99 04/22/2021 0825   AST 18 04/22/2021 0825   AST 14 (L) 03/31/2021 0954   ALT 9 04/22/2021 0825   ALT 7 03/31/2021 0954   BILITOT 0.4 04/22/2021 0825   BILITOT 0.3 03/31/2021 0954       Impression and Plan: Ms. Goens is a very pleasant 80 yo African American female with stage IV adenocarcinoma of the gallbladder.   I have adjusted her treatment protocol.  We are only doing day 1 of therapy not day 8.  I just do not think that her blood counts can manage a day 8 of treatment.  Again everything is all about quality of life.  Right now, her quality of life is doing pretty well.  I think a lot of this has to do with the fact that her hemoglobin has come up so nicely.  Again we are watching the kidney function.  I reassured her son that I just do not think that she was on any medication with respect to her chemotherapy that is known to have any adverse kidney effects.  I am sure that there is always case reports of adverse effects of chemotherapy with renal function.  We will go ahead with her 11th cycle today.  I do not think we need to do any scans on her for another month or so.  Last MRI was  done back in June.    Volanda Napoleon, MD 8/26/20221:08 PM

## 2021-04-22 NOTE — Patient Instructions (Signed)
Implanted Port Home Guide An implanted port is a device that is placed under the skin. It is usually placed in the chest. The device can be used to give IV medicine, to take blood, or for dialysis. You may have an implanted port if: You need IV medicine that would be irritating to the small veins in your hands or arms. You need IV medicines, such as antibiotics, for a long period of time. You need IV nutrition for a long period of time. You need dialysis. When you have a port, your health care provider can choose to use the port instead of veins in your arms for these procedures. You may have fewer limitations when using a port than you would if you used other types of long-term IVs, and you will likely be able to return to normal activities afteryour incision heals. An implanted port has two main parts: Reservoir. The reservoir is the part where a needle is inserted to give medicines or draw blood. The reservoir is round. After it is placed, it appears as a small, raised area under your skin. Catheter. The catheter is a thin, flexible tube that connects the reservoir to a vein. Medicine that is inserted into the reservoir goes into the catheter and then into the vein. How is my port accessed? To access your port: A numbing cream may be placed on the skin over the port site. Your health care provider will put on a mask and sterile gloves. The skin over your port will be cleaned carefully with a germ-killing soap and allowed to dry. Your health care provider will gently pinch the port and insert a needle into it. Your health care provider will check for a blood return to make sure the port is in the vein and is not clogged. If your port needs to remain accessed to get medicine continuously (constant infusion), your health care provider will place a clear bandage (dressing) over the needle site. The dressing and needle will need to be changed every week, or as told by your health care provider. What  is flushing? Flushing helps keep the port from getting clogged. Follow instructions from your health care provider about how and when to flush the port. Ports are usually flushed with saline solution or a medicine called heparin. The need for flushing will depend on how the port is used: If the port is only used from time to time to give medicines or draw blood, the port may need to be flushed: Before and after medicines have been given. Before and after blood has been drawn. As part of routine maintenance. Flushing may be recommended every 4-6 weeks. If a constant infusion is running, the port may not need to be flushed. Throw away any syringes in a disposal container that is meant for sharp items (sharps container). You can buy a sharps container from a pharmacy, or you can make one by using an empty hard plastic bottle with a cover. How long will my port stay implanted? The port can stay in for as long as your health care provider thinks it is needed. When it is time for the port to come out, a surgery will be done to remove it. The surgery will be similar to the procedure that was done to putthe port in. Follow these instructions at home:  Flush your port as told by your health care provider. If you need an infusion over several days, follow instructions from your health care provider about how to take   care of your port site. Make sure you: Wash your hands with soap and water before you change your dressing. If soap and water are not available, use alcohol-based hand sanitizer. Change your dressing as told by your health care provider. Place any used dressings or infusion bags into a plastic bag. Throw that bag in the trash. Keep the dressing that covers the needle clean and dry. Do not get it wet. Do not use scissors or sharp objects near the tube. Keep the tube clamped, unless it is being used. Check your port site every day for signs of infection. Check for: Redness, swelling, or  pain. Fluid or blood. Pus or a bad smell. Protect the skin around the port site. Avoid wearing bra straps that rub or irritate the site. Protect the skin around your port from seat belts. Place a soft pad over your chest if needed. Bathe or shower as told by your health care provider. The site may get wet as long as you are not actively receiving an infusion. Return to your normal activities as told by your health care provider. Ask your health care provider what activities are safe for you. Carry a medical alert card or wear a medical alert bracelet at all times. This will let health care providers know that you have an implanted port in case of an emergency. Get help right away if: You have redness, swelling, or pain at the port site. You have fluid or blood coming from your port site. You have pus or a bad smell coming from the port site. You have a fever. Summary Implanted ports are usually placed in the chest for long-term IV access. Follow instructions from your health care provider about flushing the port and changing bandages (dressings). Take care of the area around your port by avoiding clothing that puts pressure on the area, and by watching for signs of infection. Protect the skin around your port from seat belts. Place a soft pad over your chest if needed. Get help right away if you have a fever or you have redness, swelling, pain, drainage, or a bad smell at the port site. This information is not intended to replace advice given to you by your health care provider. Make sure you discuss any questions you have with your healthcare provider. Document Revised: 12/29/2019 Document Reviewed: 12/29/2019 Elsevier Patient Education  2022 Elsevier Inc.  

## 2021-04-23 LAB — CANCER ANTIGEN 19-9: CA 19-9: 117 U/mL — ABNORMAL HIGH (ref 0–35)

## 2021-04-23 LAB — T4: T4, Total: 7.9 ug/dL (ref 4.5–12.0)

## 2021-04-25 ENCOUNTER — Other Ambulatory Visit: Payer: Self-pay | Admitting: *Deleted

## 2021-04-25 MED ORDER — DRONABINOL 2.5 MG PO CAPS: 2.5000 mg | ORAL_CAPSULE | Freq: Two times a day (BID) | ORAL | 0 refills | Status: DC

## 2021-04-27 ENCOUNTER — Other Ambulatory Visit: Payer: Self-pay | Admitting: Hematology & Oncology

## 2021-04-27 DIAGNOSIS — C23 Malignant neoplasm of gallbladder: Secondary | ICD-10-CM

## 2021-04-28 ENCOUNTER — Inpatient Hospital Stay: Payer: Medicare PPO

## 2021-04-28 ENCOUNTER — Other Ambulatory Visit (HOSPITAL_BASED_OUTPATIENT_CLINIC_OR_DEPARTMENT_OTHER): Payer: Self-pay | Admitting: Nephrology

## 2021-04-28 ENCOUNTER — Other Ambulatory Visit: Payer: Self-pay

## 2021-04-28 ENCOUNTER — Encounter: Payer: Self-pay | Admitting: *Deleted

## 2021-04-28 ENCOUNTER — Inpatient Hospital Stay: Payer: Medicare PPO | Attending: Hematology & Oncology

## 2021-04-28 DIAGNOSIS — Z79899 Other long term (current) drug therapy: Secondary | ICD-10-CM | POA: Diagnosis not present

## 2021-04-28 DIAGNOSIS — D509 Iron deficiency anemia, unspecified: Secondary | ICD-10-CM | POA: Diagnosis not present

## 2021-04-28 DIAGNOSIS — Z5111 Encounter for antineoplastic chemotherapy: Secondary | ICD-10-CM | POA: Diagnosis present

## 2021-04-28 DIAGNOSIS — C787 Secondary malignant neoplasm of liver and intrahepatic bile duct: Secondary | ICD-10-CM | POA: Insufficient documentation

## 2021-04-28 DIAGNOSIS — Z5112 Encounter for antineoplastic immunotherapy: Secondary | ICD-10-CM | POA: Insufficient documentation

## 2021-04-28 DIAGNOSIS — C23 Malignant neoplasm of gallbladder: Secondary | ICD-10-CM | POA: Insufficient documentation

## 2021-04-28 DIAGNOSIS — R160 Hepatomegaly, not elsewhere classified: Secondary | ICD-10-CM

## 2021-04-28 DIAGNOSIS — N184 Chronic kidney disease, stage 4 (severe): Secondary | ICD-10-CM

## 2021-04-28 LAB — COMPREHENSIVE METABOLIC PANEL
ALT: 11 U/L (ref 0–44)
AST: 19 U/L (ref 15–41)
Albumin: 3.7 g/dL (ref 3.5–5.0)
Alkaline Phosphatase: 108 U/L (ref 38–126)
Anion gap: 8 (ref 5–15)
BUN: 32 mg/dL — ABNORMAL HIGH (ref 8–23)
CO2: 26 mmol/L (ref 22–32)
Calcium: 9.1 mg/dL (ref 8.9–10.3)
Chloride: 100 mmol/L (ref 98–111)
Creatinine, Ser: 1.84 mg/dL — ABNORMAL HIGH (ref 0.44–1.00)
GFR, Estimated: 27 mL/min — ABNORMAL LOW (ref >60)
Glucose, Bld: 106 mg/dL — ABNORMAL HIGH (ref 70–99)
Potassium: 4 mmol/L (ref 3.5–5.1)
Sodium: 134 mmol/L — ABNORMAL LOW (ref 135–145)
Total Bilirubin: 0.4 mg/dL (ref 0.3–1.2)
Total Protein: 6.3 g/dL — ABNORMAL LOW (ref 6.5–8.1)

## 2021-04-28 LAB — CBC WITH DIFFERENTIAL/PLATELET
Abs Immature Granulocytes: 0.01 10*3/uL (ref 0.00–0.07)
Basophils Absolute: 0 10*3/uL (ref 0.0–0.1)
Basophils Relative: 1 %
Eosinophils Absolute: 0 10*3/uL (ref 0.0–0.5)
Eosinophils Relative: 0 %
HCT: 38.4 % (ref 36.0–46.0)
Hemoglobin: 12.2 g/dL (ref 12.0–15.0)
Immature Granulocytes: 1 %
Lymphocytes Relative: 21 %
Lymphs Abs: 0.4 10*3/uL — ABNORMAL LOW (ref 0.7–4.0)
MCH: 32.1 pg (ref 26.0–34.0)
MCHC: 31.8 g/dL (ref 30.0–36.0)
MCV: 101.1 fL — ABNORMAL HIGH (ref 80.0–100.0)
Monocytes Absolute: 0.5 10*3/uL (ref 0.1–1.0)
Monocytes Relative: 21 %
Neutro Abs: 1.2 10*3/uL — ABNORMAL LOW (ref 1.7–7.7)
Neutrophils Relative %: 56 %
Platelets: 119 10*3/uL — ABNORMAL LOW (ref 150–400)
RBC: 3.8 MIL/uL — ABNORMAL LOW (ref 3.87–5.11)
RDW: 18.4 % — ABNORMAL HIGH (ref 11.5–15.5)
WBC: 2.2 10*3/uL — ABNORMAL LOW (ref 4.0–10.5)
nRBC: 0 % (ref 0.0–0.2)

## 2021-04-28 LAB — TSH: TSH: 0.917 u[IU]/mL (ref 0.308–3.960)

## 2021-04-28 MED ORDER — SODIUM CHLORIDE 0.9 % IV SOLN
257.0000 mg | Freq: Once | INTRAVENOUS | Status: AC
  Administered 2021-04-28: 260 mg via INTRAVENOUS
  Filled 2021-04-28: qty 26

## 2021-04-28 MED ORDER — SODIUM CHLORIDE 0.9 % IV SOLN
800.0000 mg/m2 | Freq: Once | INTRAVENOUS | Status: AC
  Administered 2021-04-28: 1368 mg via INTRAVENOUS
  Filled 2021-04-28: qty 26.3

## 2021-04-28 MED ORDER — SODIUM CHLORIDE 0.9 % IV SOLN
10.0000 mg | Freq: Once | INTRAVENOUS | Status: AC
  Administered 2021-04-28: 10 mg via INTRAVENOUS
  Filled 2021-04-28: qty 10

## 2021-04-28 MED ORDER — SODIUM CHLORIDE 0.9% FLUSH
10.0000 mL | INTRAVENOUS | Status: DC | PRN
  Administered 2021-04-28: 10 mL

## 2021-04-28 MED ORDER — DIPHENHYDRAMINE HCL 50 MG/ML IJ SOLN
25.0000 mg | Freq: Once | INTRAMUSCULAR | Status: AC
  Administered 2021-04-28: 25 mg via INTRAVENOUS
  Filled 2021-04-28: qty 1

## 2021-04-28 MED ORDER — HEPARIN SOD (PORK) LOCK FLUSH 100 UNIT/ML IV SOLN
500.0000 [IU] | Freq: Once | INTRAVENOUS | Status: AC | PRN
  Administered 2021-04-28: 500 [IU]

## 2021-04-28 MED ORDER — PALONOSETRON HCL INJECTION 0.25 MG/5ML
0.2500 mg | Freq: Once | INTRAVENOUS | Status: AC
  Administered 2021-04-28: 0.25 mg via INTRAVENOUS
  Filled 2021-04-28: qty 5

## 2021-04-28 MED ORDER — SODIUM CHLORIDE 0.9 % IV SOLN
150.0000 mg | Freq: Once | INTRAVENOUS | Status: AC
  Administered 2021-04-28: 150 mg via INTRAVENOUS
  Filled 2021-04-28: qty 150

## 2021-04-28 MED ORDER — FAMOTIDINE 20 MG IN NS 100 ML IVPB
20.0000 mg | Freq: Once | INTRAVENOUS | Status: AC
  Administered 2021-04-28: 20 mg via INTRAVENOUS
  Filled 2021-04-28: qty 20

## 2021-04-28 MED ORDER — SODIUM CHLORIDE 0.9 % IV SOLN: Freq: Once | INTRAVENOUS | Status: AC

## 2021-04-28 MED ORDER — SODIUM CHLORIDE 0.9 % IV SOLN
1500.0000 mg | Freq: Once | INTRAVENOUS | Status: AC
  Administered 2021-04-28: 1500 mg via INTRAVENOUS
  Filled 2021-04-28: qty 30

## 2021-04-28 NOTE — Progress Notes (Signed)
Per dr ennever okay to treat today despite labs °

## 2021-04-28 NOTE — Patient Instructions (Signed)
[facility]  Discharge Instructions: Thank you for choosing [facility] to provide your oncology and hematology care.   If you have a lab appointment with the [facility], please go directly to the [facility] and check in at the registration area.  Wear comfortable clothing and clothing appropriate for easy access to any Portacath or PICC line.   We strive to give you quality time with your provider. You may need to reschedule your appointment if you arrive late (15 or more minutes).  Arriving late affects you and other patients whose appointments are after yours.  Also, if you miss three or more appointments without notifying the office, you may be dismissed from the clinic at the provider's discretion.      For prescription refill requests, have your pharmacy contact our office and allow 72 hours for refills to be completed.    Today you received the following chemotherapy and/or immunotherapy agents imfinzi, gemzar, carboplatin     To help prevent nausea and vomiting after your treatment, we encourage you to take your nausea medication as directed.  BELOW ARE SYMPTOMS THAT SHOULD BE REPORTED IMMEDIATELY: *FEVER GREATER THAN 100.4 F (38 C) OR HIGHER *CHILLS OR SWEATING *NAUSEA AND VOMITING THAT IS NOT CONTROLLED WITH YOUR NAUSEA MEDICATION *UNUSUAL SHORTNESS OF BREATH *UNUSUAL BRUISING OR BLEEDING *URINARY PROBLEMS (pain or burning when urinating, or frequent urination) *BOWEL PROBLEMS (unusual diarrhea, constipation, pain near the anus) TENDERNESS IN MOUTH AND THROAT WITH OR WITHOUT PRESENCE OF ULCERS (sore throat, sores in mouth, or a toothache) UNUSUAL RASH, SWELLING OR PAIN  UNUSUAL VAGINAL DISCHARGE OR ITCHING   Items with * indicate a potential emergency and should be followed up as soon as possible or go to the Emergency Department if any problems should occur.  Please show the CHEMOTHERAPY ALERT CARD or IMMUNOTHERAPY ALERT CARD at  check-in to the Emergency Department and triage nurse. Should you have questions after your visit or need to cancel or reschedule your appointment, please contact [facility]  [phone] and follow the prompts.  Office hours are 8:00 a.m. to 4:30 p.m. Monday - Friday. Please note that voicemails left after 4:00 p.m. may not be returned until the following business day.  We are closed weekends and major holidays. You have access to a nurse at all times for urgent questions. Please call the main number to the clinic [phone] and follow the prompts.  For any non-urgent questions, you may also contact your provider using MyChart. We now offer e-Visits for anyone 63 and older to request care online for non-urgent symptoms. For details visit [email or web address].   Also download the MyChart app! Go to the app store, search "MyChart", open the app, select [facility], and log in with your MyChart username and password.  Due to Covid, a mask is required upon entering the hospital/clinic. If you do not have a mask, one will be given to you upon arrival. For doctor visits, patients may have 1 support person aged 66 or older with them. For treatment visits, patients cannot have anyone with them due to current Covid guidelines and our immunocompromised population.

## 2021-04-28 NOTE — Patient Instructions (Signed)

## 2021-04-29 ENCOUNTER — Ambulatory Visit: Payer: Medicare PPO

## 2021-04-29 ENCOUNTER — Other Ambulatory Visit: Payer: Medicare PPO

## 2021-04-29 LAB — T4: T4, Total: 7.1 ug/dL (ref 4.5–12.0)

## 2021-05-17 ENCOUNTER — Other Ambulatory Visit: Payer: Self-pay

## 2021-05-17 ENCOUNTER — Ambulatory Visit (HOSPITAL_BASED_OUTPATIENT_CLINIC_OR_DEPARTMENT_OTHER)
Admission: RE | Admit: 2021-05-17 | Discharge: 2021-05-17 | Disposition: A | Discharge status: Home / Self Care | Payer: Medicare PPO | Source: Ambulatory Visit | Attending: Nephrology | Admitting: Nephrology

## 2021-05-17 DIAGNOSIS — N184 Chronic kidney disease, stage 4 (severe): Secondary | ICD-10-CM | POA: Diagnosis not present

## 2021-05-17 IMAGING — US US RENAL
1 series · 14 of 25 positions shown · non-contrast
Comparison: MRI abdomen [DATE].

CLINICAL DATA: Stage 4 kidney disease.

EXAM:
RENAL / URINARY TRACT ULTRASOUND COMPLETE

[Series 1: us renal · 14 of 56 slices shown]
[im 1/56]
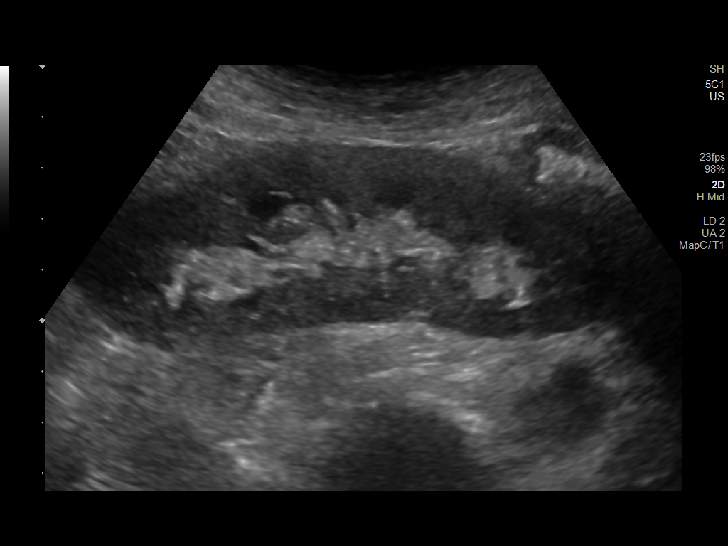
[im 5/56]
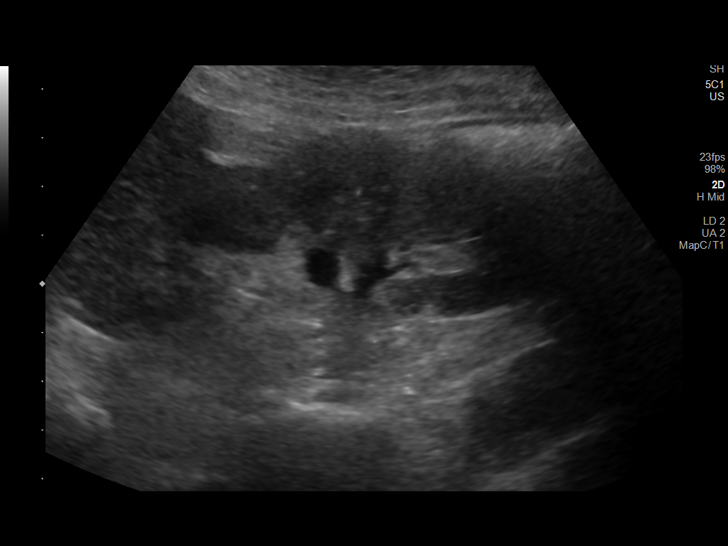
[im 10/56]
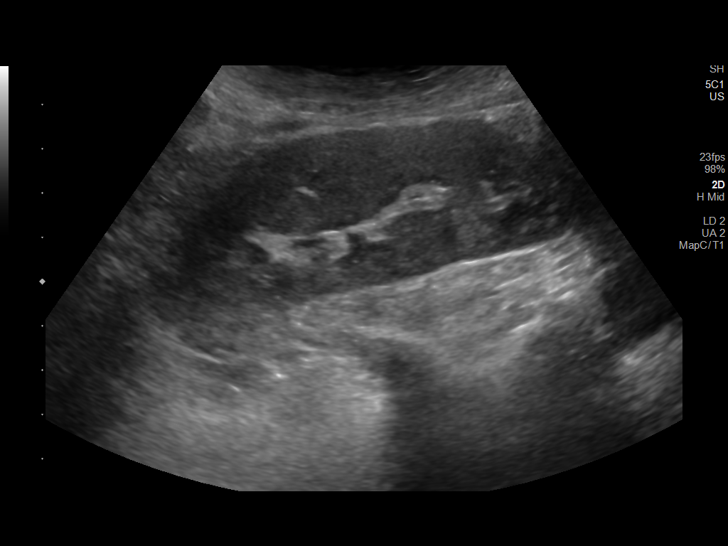
[im 14/56]
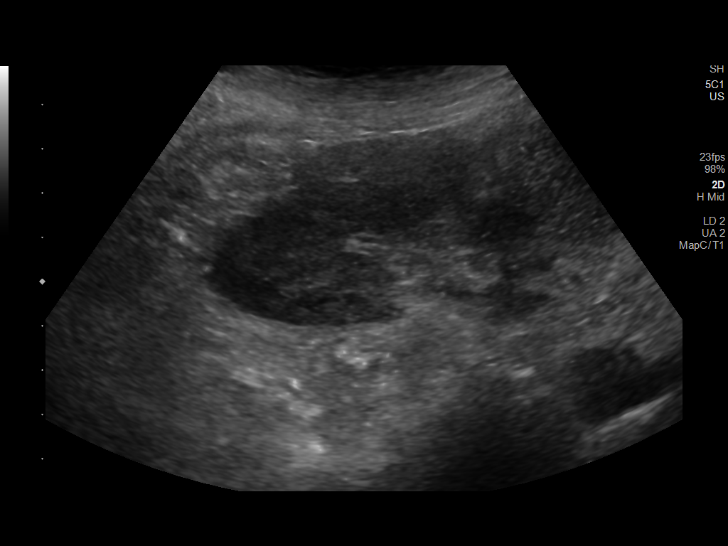
[im 19/56]
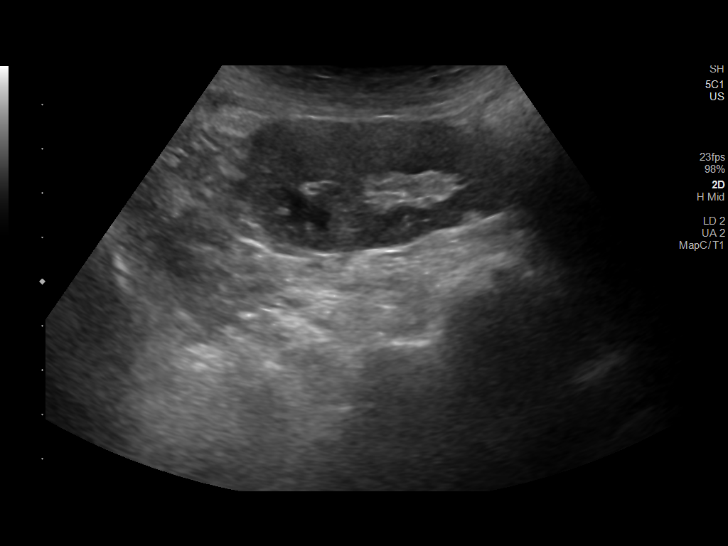
[im 21/56]
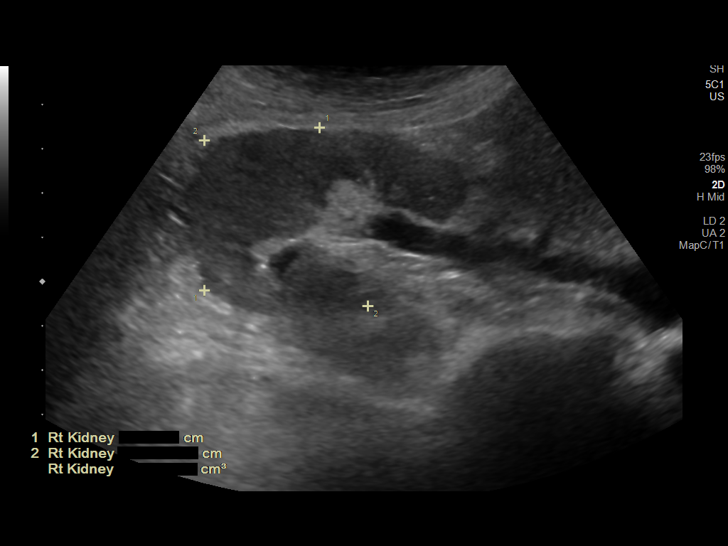
[im 26/56]
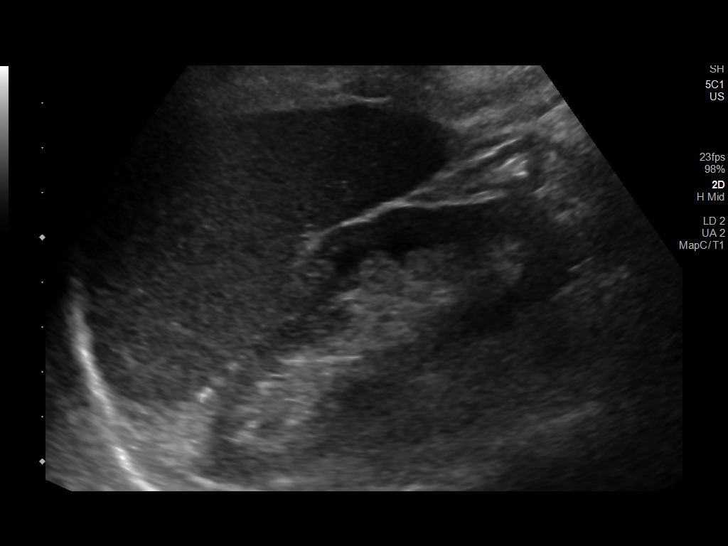
[im 30/56]
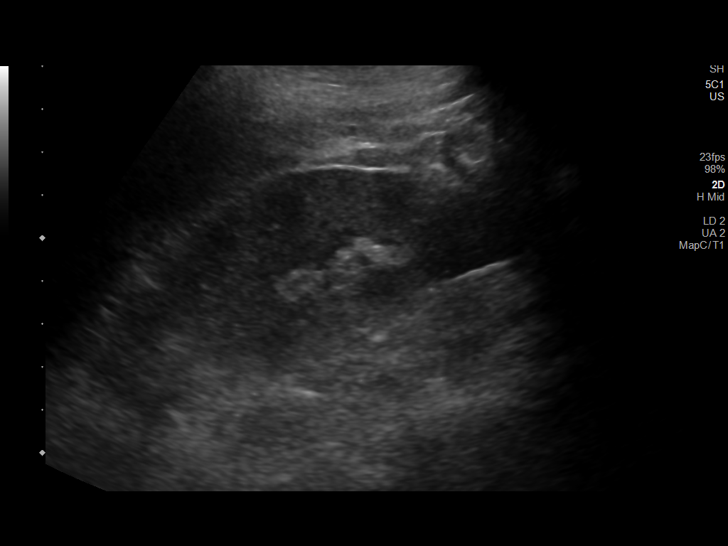
[im 35/56]
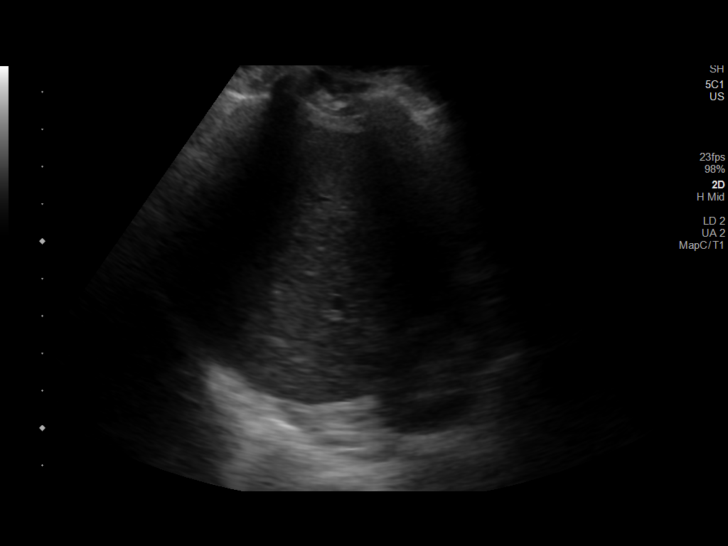
[im 37/56]
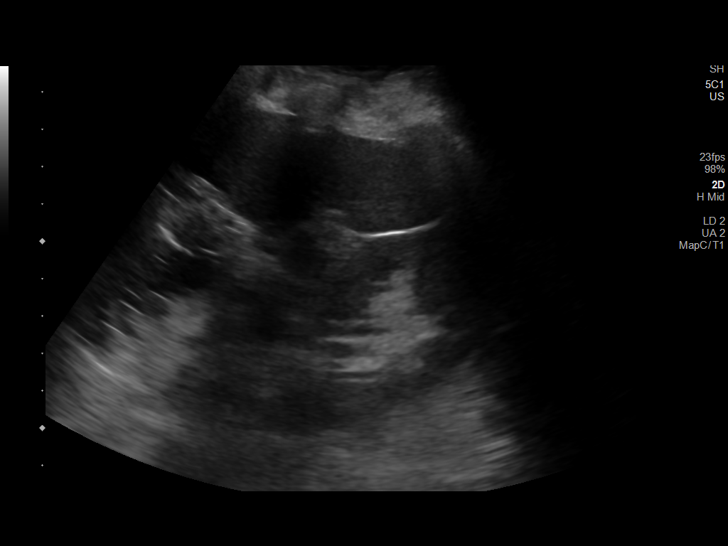
[im 42/56]
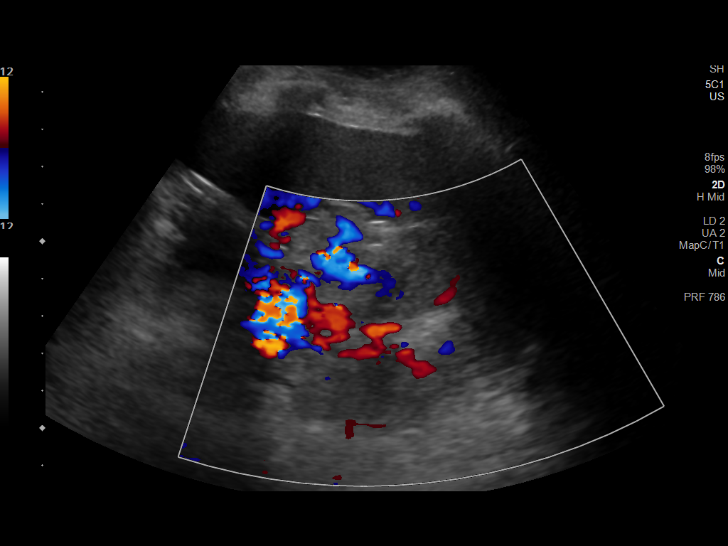
[im 46/56]
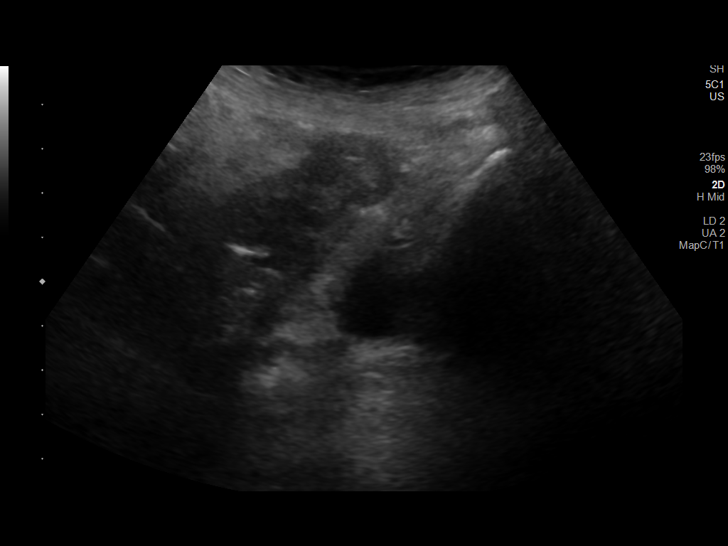
[im 51/56]
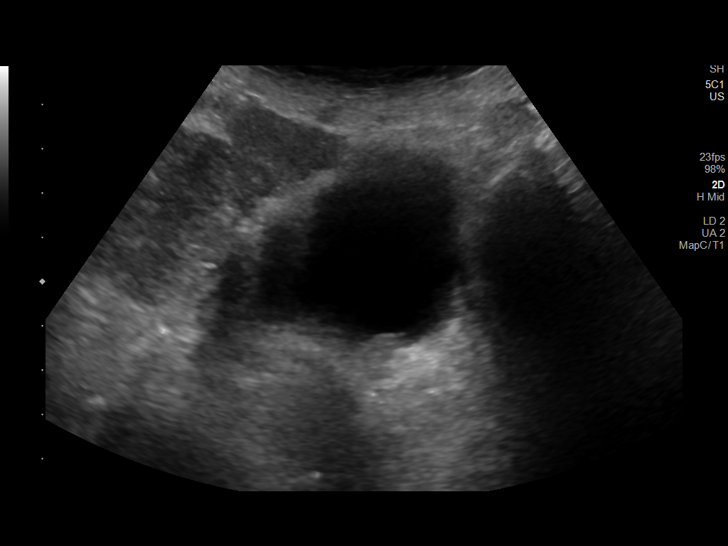
[im 56/56]
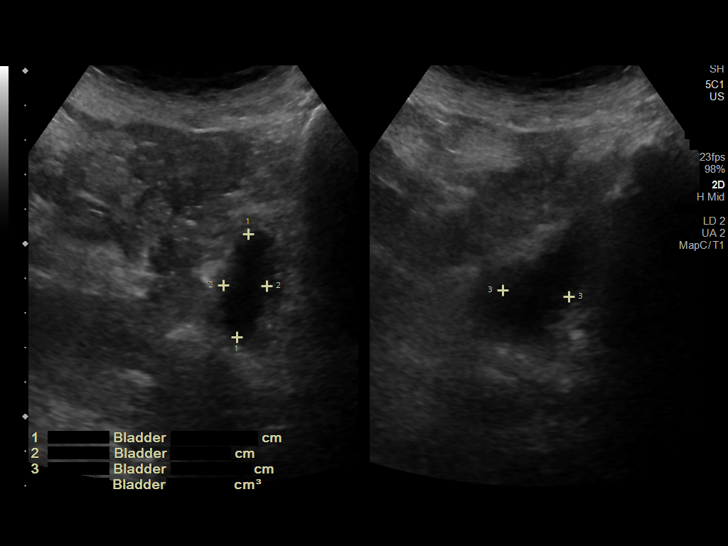

[14 of 25 positions shown; findings below may reference images not displayed]

FINDINGS: Right Kidney:

Renal measurements: 10.5 x 4.5 x 5.3 = volume: 130 mL. Echogenicity
within normal limits. No mass or hydronephrosis visualized.

Left Kidney:

Renal measurements: 10.0 x 4.8 x 4.9 = volume: 123 mL. Echogenicity
within normal limits. No mass or hydronephrosis visualized.

Bladder:

Appears normal for degree of bladder distention. Postvoid residual
3.8 cc.

Other:

None.
IMPRESSION: Within normal limits.

## 2021-05-23 ENCOUNTER — Other Ambulatory Visit: Payer: Self-pay | Admitting: *Deleted

## 2021-05-23 MED ORDER — DRONABINOL 2.5 MG PO CAPS: 2.5000 mg | ORAL_CAPSULE | Freq: Two times a day (BID) | ORAL | 0 refills | Status: DC

## 2021-05-26 ENCOUNTER — Other Ambulatory Visit: Payer: Self-pay | Admitting: *Deleted

## 2021-05-26 MED ORDER — METOPROLOL TARTRATE 25 MG PO TABS: ORAL_TABLET | ORAL | 2 refills | Status: DC

## 2021-05-27 ENCOUNTER — Inpatient Hospital Stay: Payer: Medicare PPO

## 2021-05-27 ENCOUNTER — Encounter: Payer: Self-pay | Admitting: *Deleted

## 2021-05-27 ENCOUNTER — Other Ambulatory Visit: Payer: Self-pay

## 2021-05-27 ENCOUNTER — Inpatient Hospital Stay (HOSPITAL_BASED_OUTPATIENT_CLINIC_OR_DEPARTMENT_OTHER): Payer: Medicare PPO | Admitting: Hematology & Oncology

## 2021-05-27 ENCOUNTER — Encounter: Payer: Self-pay | Admitting: Hematology & Oncology

## 2021-05-27 VITALS — BP 188/92 | HR 62 | Temp 97.9°F | Resp 18 | Wt 150.0 lb

## 2021-05-27 DIAGNOSIS — C23 Malignant neoplasm of gallbladder: Secondary | ICD-10-CM

## 2021-05-27 DIAGNOSIS — Z5112 Encounter for antineoplastic immunotherapy: Secondary | ICD-10-CM | POA: Diagnosis not present

## 2021-05-27 DIAGNOSIS — R160 Hepatomegaly, not elsewhere classified: Secondary | ICD-10-CM

## 2021-05-27 LAB — COMPREHENSIVE METABOLIC PANEL
ALT: 11 U/L (ref 0–44)
AST: 21 U/L (ref 15–41)
Albumin: 3.5 g/dL (ref 3.5–5.0)
Alkaline Phosphatase: 135 U/L — ABNORMAL HIGH (ref 38–126)
Anion gap: 9 (ref 5–15)
BUN: 18 mg/dL (ref 8–23)
CO2: 28 mmol/L (ref 22–32)
Calcium: 9 mg/dL (ref 8.9–10.3)
Chloride: 100 mmol/L (ref 98–111)
Creatinine, Ser: 1.27 mg/dL — ABNORMAL HIGH (ref 0.44–1.00)
GFR, Estimated: 43 mL/min — ABNORMAL LOW (ref >60)
Glucose, Bld: 164 mg/dL — ABNORMAL HIGH (ref 70–99)
Potassium: 3 mmol/L — ABNORMAL LOW (ref 3.5–5.1)
Sodium: 137 mmol/L (ref 135–145)
Total Bilirubin: 0.3 mg/dL (ref 0.3–1.2)
Total Protein: 6.1 g/dL — ABNORMAL LOW (ref 6.5–8.1)

## 2021-05-27 LAB — CBC WITH DIFFERENTIAL/PLATELET
Abs Immature Granulocytes: 0 10*3/uL (ref 0.00–0.07)
Basophils Absolute: 0 10*3/uL (ref 0.0–0.1)
Basophils Relative: 0 %
Eosinophils Absolute: 0 10*3/uL (ref 0.0–0.5)
Eosinophils Relative: 0 %
HCT: 36.5 % (ref 36.0–46.0)
Hemoglobin: 11.7 g/dL — ABNORMAL LOW (ref 12.0–15.0)
Immature Granulocytes: 0 %
Lymphocytes Relative: 18 %
Lymphs Abs: 0.4 10*3/uL — ABNORMAL LOW (ref 0.7–4.0)
MCH: 32.1 pg (ref 26.0–34.0)
MCHC: 32.1 g/dL (ref 30.0–36.0)
MCV: 100.3 fL — ABNORMAL HIGH (ref 80.0–100.0)
Monocytes Absolute: 0.4 10*3/uL (ref 0.1–1.0)
Monocytes Relative: 17 %
Neutro Abs: 1.5 10*3/uL — ABNORMAL LOW (ref 1.7–7.7)
Neutrophils Relative %: 65 %
Platelets: 140 10*3/uL — ABNORMAL LOW (ref 150–400)
RBC: 3.64 MIL/uL — ABNORMAL LOW (ref 3.87–5.11)
RDW: 18.3 % — ABNORMAL HIGH (ref 11.5–15.5)
WBC: 2.3 10*3/uL — ABNORMAL LOW (ref 4.0–10.5)
nRBC: 0 % (ref 0.0–0.2)

## 2021-05-27 MED ORDER — PALONOSETRON HCL INJECTION 0.25 MG/5ML
0.2500 mg | Freq: Once | INTRAVENOUS | Status: AC
  Administered 2021-05-27: 0.25 mg via INTRAVENOUS
  Filled 2021-05-27: qty 5

## 2021-05-27 MED ORDER — SODIUM CHLORIDE 0.9 % IV SOLN
150.0000 mg | Freq: Once | INTRAVENOUS | Status: AC
  Administered 2021-05-27: 150 mg via INTRAVENOUS
  Filled 2021-05-27: qty 150

## 2021-05-27 MED ORDER — SODIUM CHLORIDE 0.9 % IV SOLN
1500.0000 mg | Freq: Once | INTRAVENOUS | Status: AC
  Administered 2021-05-27: 1500 mg via INTRAVENOUS
  Filled 2021-05-27: qty 30

## 2021-05-27 MED ORDER — SODIUM CHLORIDE 0.9 % IV SOLN
316.0000 mg | Freq: Once | INTRAVENOUS | Status: AC
  Administered 2021-05-27: 320 mg via INTRAVENOUS
  Filled 2021-05-27: qty 32

## 2021-05-27 MED ORDER — SODIUM CHLORIDE 0.9 % IV SOLN
800.0000 mg/m2 | Freq: Once | INTRAVENOUS | Status: AC
  Administered 2021-05-27: 1368 mg via INTRAVENOUS
  Filled 2021-05-27: qty 26.3

## 2021-05-27 MED ORDER — DIPHENHYDRAMINE HCL 50 MG/ML IJ SOLN
25.0000 mg | Freq: Once | INTRAMUSCULAR | Status: AC
  Administered 2021-05-27: 25 mg via INTRAVENOUS
  Filled 2021-05-27: qty 1

## 2021-05-27 MED ORDER — SODIUM CHLORIDE 0.9% FLUSH
10.0000 mL | INTRAVENOUS | Status: DC | PRN
  Administered 2021-05-27: 10 mL

## 2021-05-27 MED ORDER — SODIUM CHLORIDE 0.9 % IV SOLN: Freq: Once | INTRAVENOUS | Status: DC

## 2021-05-27 MED ORDER — SODIUM CHLORIDE 0.9 % IV SOLN: Freq: Once | INTRAVENOUS | Status: AC

## 2021-05-27 MED ORDER — HEPARIN SOD (PORK) LOCK FLUSH 100 UNIT/ML IV SOLN
500.0000 [IU] | Freq: Once | INTRAVENOUS | Status: AC | PRN
  Administered 2021-05-27: 500 [IU]

## 2021-05-27 MED ORDER — DEXAMETHASONE SODIUM PHOSPHATE 100 MG/10ML IJ SOLN
10.0000 mg | Freq: Once | INTRAMUSCULAR | Status: AC
  Administered 2021-05-27: 10 mg via INTRAVENOUS
  Filled 2021-05-27: qty 10

## 2021-05-27 MED ORDER — FAMOTIDINE 20 MG IN NS 100 ML IVPB
20.0000 mg | Freq: Once | INTRAVENOUS | Status: AC
Start: 2021-05-27 — End: 2021-05-27
  Administered 2021-05-27: 20 mg via INTRAVENOUS
  Filled 2021-05-27: qty 20

## 2021-05-27 NOTE — Progress Notes (Signed)
Patient will need an MRI prior to her next appointment. Will ensure scheduling once PA is obtained.   Oncology Nurse Navigator Documentation  Oncology Nurse Navigator Flowsheets 05/27/2021  Abnormal Finding Date -  Planned Course of Treatment -  Phase of Treatment -  Chemotherapy Actual Start Date: -  Navigator Follow Up Date: 05/31/2021  Navigator Follow Up Reason: Appointment Review  Navigator Location [facility]  Referral Date to RadOnc/MedOnc -  Navigator Encounter Type Follow-up Appt;Appt/Treatment Plan Review  Telephone -  Treatment Initiated Date -  Patient Visit Type MedOnc  Treatment Phase Active Tx  Barriers/Navigation Needs Coordination of Care;Education  Education -  Interventions Psycho-Social Support  Acuity Level 2-Minimal Needs (1-2 Barriers Identified)  Referrals -  Coordination of Care -  Education Method -  Support Groups/Services Friends and Family  Time Spent with Patient 15

## 2021-05-27 NOTE — Patient Instructions (Signed)

## 2021-05-27 NOTE — Progress Notes (Signed)
Hematology and Oncology Follow Up Visit  Vanessa Garrett [id number] 02/07/1941 80 y.o. 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   Interim History:  Vanessa Garrett is here today for follow-up and treatment.  She actually is doing quite nicely.  I think there is been some periods of confusion at home.  She certainly seem to be quite alert today.  There is been no problems with pain.  She still has some swelling in the legs.  I think this is going to be chronic.  She is on Lasix now.  She has had no problems with nausea or vomiting.  She is eating.  Her last CA 19-9 was down to 217.  She has had no bleeding..  She is on Retacrit at home for the anemia.  She is doing well with this.  We did give her arm last time that we saw her.  Her iron saturation was 22%.  There has been no problems with rashes.  She has had no issues with bowels or bladder.  Currently, I would say performance status is ECOG 2.  U   Medications:  Allergies as of 05/27/2021       Reactions   Megace Es [megestrol Acetate] Swelling   Face swelling        Medication List        Accurate as of May 27, 2021 11:20 AM. If you have any questions, ask your nurse or doctor.          dexamethasone 4 MG tablet Commonly known as: DECADRON Take 2 tablets (8 mg total) by mouth daily. Start the day after carboplatin chemotherapy for 3 days.   dronabinol 2.5 MG capsule Commonly known as: MARINOL Take 1 capsule (2.5 mg total) by mouth 2 (two) times daily before a meal.   furosemide 40 MG tablet Commonly  known as: LASIX Take 40 mg by mouth daily.   Generlac 10 GM/15ML Soln Generic drug: lactulose (encephalopathy) Take 10 g by mouth daily as needed (constipation).   lidocaine-prilocaine cream Commonly known as: EMLA Apply to affected area once   metoprolol tartrate 25 MG tablet Commonly known as: LOPRESSOR TAKE 1 TABLET(25 MG) BY MOUTH TWICE DAILY   NON FORMULARY Take 2 tablets by mouth daily. Mega Food - Blood Builder   NON FORMULARY 1.25 mg every other day. Beet Root Powder   potassium chloride SA 20 MEQ tablet Commonly known as: KLOR-CON TAKE 1 TABLET BY MOUTH EVERY DAY   Retacrit 10000 UNIT/ML injection Generic drug: epoetin alfa-epbx Inject 1 mL (10,000 Units total) into the skin daily.   S.S.S. TONIC PO Take 45 mLs by mouth every other day.   triamterene-hydrochlorothiazide 75-50 MG tablet Commonly known as: MAXZIDE Take 1 tablet by mouth daily.        Allergies:  Allergies  Allergen Reactions   Megace Es [Megestrol Acetate] Swelling    Face swelling    Past Medical History, Surgical history, Social history, and Family History were reviewed and updated.  Review of Systems: Review of Systems  Constitutional:  Positive for malaise/fatigue and  weight loss.  HENT: Negative.    Eyes: Negative.   Respiratory: Negative.    Cardiovascular:  Positive for leg swelling.  Gastrointestinal:  Positive for nausea.  Genitourinary: Negative.   Musculoskeletal: Negative.   Skin: Negative.   Neurological: Negative.   Endo/Heme/Allergies: Negative.   Psychiatric/Behavioral: Negative.    Marland Kitchen   Physical Exam:  weight is 150 lb (68 kg). Her oral temperature is 97.9 F (36.6 C). Her blood pressure is 188/92 (abnormal) and her pulse is 62. Her respiration is 18 and oxygen saturation is 97%.   Wt Readings from Last 3 Encounters:  05/27/21 150 lb (68 kg)  04/28/21 141 lb 1.9 oz (64 kg)  04/22/21 142 lb 0.6 oz (64.4 kg)    Physical Exam Vitals reviewed.  HENT:      Head: Normocephalic and atraumatic.  Eyes:     Pupils: Pupils are equal, round, and reactive to light.  Cardiovascular:     Rate and Rhythm: Normal rate and regular rhythm.     Heart sounds: Normal heart sounds.  Pulmonary:     Effort: Pulmonary effort is normal.     Breath sounds: Normal breath sounds.  Abdominal:     General: Bowel sounds are normal.     Palpations: Abdomen is soft.  Musculoskeletal:        General: No tenderness or deformity. Normal range of motion.     Cervical back: Normal range of motion.     Comments: Her extremities does show some swelling in the legs.  There might be a little bit more swelling in the right leg than in the left leg.  Lymphadenopathy:     Cervical: No cervical adenopathy.  Skin:    General: Skin is warm and dry.     Findings: No erythema or rash.  Neurological:     Mental Status: She is alert and oriented to person, place, and time.  Psychiatric:        Behavior: Behavior normal.        Thought Content: Thought content normal.        Judgment: Judgment normal.     Lab Results  Component Value Date   WBC 2.3 (L) 05/27/2021   HGB 11.7 (L) 05/27/2021   HCT 36.5 05/27/2021   MCV 100.3 (H) 05/27/2021   PLT 140 (L) 05/27/2021   Lab Results  Component Value Date   FERRITIN 1,207 (H) 04/22/2021   IRON 52 04/22/2021   TIBC 241 04/22/2021   UIBC 189 04/22/2021   IRONPCTSAT 22 04/22/2021   Lab Results  Component Value Date   RETICCTPCT 2.6 03/31/2021   RBC 3.64 (L) 05/27/2021   No results found for: KPAFRELGTCHN, LAMBDASER, KAPLAMBRATIO No results found for: IGGSERUM, IGA, IGMSERUM No results found for: Kathrynn Ducking, MSPIKE, SPEI   Chemistry      Component Value Date/Time   NA 137 05/27/2021 1021   K 3.0 (L) 05/27/2021 1021   CL 100 05/27/2021 1021   CO2 28 05/27/2021 1021   BUN 18 05/27/2021 1021   CREATININE 1.27 (H) 05/27/2021 1021   CREATININE 2.25 (H) 03/31/2021 0954       Component Value Date/Time   CALCIUM 9.0 05/27/2021 1021   ALKPHOS 135 (H) 05/27/2021 1021   AST 21 05/27/2021 1021   AST 14 (L) 03/31/2021 0954   ALT 11 05/27/2021 1021   ALT 7 03/31/2021 0954   BILITOT 0.3 05/27/2021 1021   BILITOT 0.3 03/31/2021 0954  Impression and Plan: Vanessa Garrett is a very pleasant 80 yo [place] American female with stage IV adenocarcinoma of the gallbladder.   I am just happy that her overall quality of life is not doing all that bad.  I really think she is doing nicely.  Again, everything is about quality of life.  Her hemoglobin is better.  This use indicates that she will be feeling better.  We will go ahead with her I think 12 cycle today.  We will have to get a MRI before we see her back to see how things are going.  The last MRI was done back in June.    Volanda Napoleon, MD 9/30/202211:20 AM

## 2021-05-27 NOTE — Patient Instructions (Signed)
[facility]  Discharge Instructions: Thank you for choosing [facility] to provide your oncology and hematology care.   If you have a lab appointment with the [facility], please go directly to the [facility] and check in at the registration area.  Wear comfortable clothing and clothing appropriate for easy access to any Portacath or PICC line.   We strive to give you quality time with your provider. You may need to reschedule your appointment if you arrive late (15 or more minutes).  Arriving late affects you and other patients whose appointments are after yours.  Also, if you miss three or more appointments without notifying the office, you may be dismissed from the clinic at the provider's discretion.      For prescription refill requests, have your pharmacy contact our office and allow 72 hours for refills to be completed.    Today you received the following chemotherapy and/or immunotherapy agents Gemzar and Carboplatin      To help prevent nausea and vomiting after your treatment, we encourage you to take your nausea medication as directed.  BELOW ARE SYMPTOMS THAT SHOULD BE REPORTED IMMEDIATELY: *FEVER GREATER THAN 100.4 F (38 C) OR HIGHER *CHILLS OR SWEATING *NAUSEA AND VOMITING THAT IS NOT CONTROLLED WITH YOUR NAUSEA MEDICATION *UNUSUAL SHORTNESS OF BREATH *UNUSUAL BRUISING OR BLEEDING *URINARY PROBLEMS (pain or burning when urinating, or frequent urination) *BOWEL PROBLEMS (unusual diarrhea, constipation, pain near the anus) TENDERNESS IN MOUTH AND THROAT WITH OR WITHOUT PRESENCE OF ULCERS (sore throat, sores in mouth, or a toothache) UNUSUAL RASH, SWELLING OR PAIN  UNUSUAL VAGINAL DISCHARGE OR ITCHING   Items with * indicate a potential emergency and should be followed up as soon as possible or go to the Emergency Department if any problems should occur.  Please show the CHEMOTHERAPY ALERT CARD or IMMUNOTHERAPY ALERT CARD at  check-in to the Emergency Department and triage nurse. Should you have questions after your visit or need to cancel or reschedule your appointment, please contact [facility]  [phone] and follow the prompts.  Office hours are 8:00 a.m. to 4:30 p.m. Monday - Friday. Please note that voicemails left after 4:00 p.m. may not be returned until the following business day.  We are closed weekends and major holidays. You have access to a nurse at all times for urgent questions. Please call the main number to the clinic [phone] and follow the prompts.  For any non-urgent questions, you may also contact your provider using MyChart. We now offer e-Visits for anyone 80 and older to request care online for non-urgent symptoms. For details visit [email or web address].   Also download the MyChart app! Go to the app store, search "MyChart", open the app, select [facility], and log in with your MyChart username and password.  Due to Covid, a mask is required upon entering the hospital/clinic. If you do not have a mask, one will be given to you upon arrival. For doctor visits, patients may have 1 support person aged 10 or older with them. For treatment visits, patients cannot have anyone with them due to current Covid guidelines and our immunocompromised population.

## 2021-05-29 LAB — CANCER ANTIGEN 19-9: CA 19-9: 191 U/mL — ABNORMAL HIGH (ref 0–35)

## 2021-05-29 LAB — T4: T4, Total: 8.6 ug/dL (ref 4.5–12.0)

## 2021-05-30 ENCOUNTER — Other Ambulatory Visit: Payer: Self-pay | Admitting: Hematology & Oncology

## 2021-05-30 DIAGNOSIS — C23 Malignant neoplasm of gallbladder: Secondary | ICD-10-CM

## 2021-05-30 DIAGNOSIS — D631 Anemia in chronic kidney disease: Secondary | ICD-10-CM

## 2021-05-30 DIAGNOSIS — D500 Iron deficiency anemia secondary to blood loss (chronic): Secondary | ICD-10-CM

## 2021-05-30 LAB — IRON AND TIBC
Iron: 33 ug/dL — ABNORMAL LOW (ref 41–142)
Saturation Ratios: 15 % — ABNORMAL LOW (ref 21–57)
TIBC: 221 ug/dL — ABNORMAL LOW (ref 236–444)
UIBC: 188 ug/dL (ref 120–384)

## 2021-05-30 LAB — TSH: TSH: 1.356 u[IU]/mL (ref 0.308–3.960)

## 2021-05-30 LAB — FERRITIN: Ferritin: 1081 ng/mL — ABNORMAL HIGH (ref 11–307)

## 2021-05-31 ENCOUNTER — Encounter: Payer: Self-pay | Admitting: *Deleted

## 2021-05-31 NOTE — Progress Notes (Signed)
Patient needs MRI scheduled prior to her next appointment. Scheduled for 06/14/2021.  Called and spoke to patient's daughter, Vanessa Garrett, and reviewed appointment date, time and location. Also reviewed 11:30a arrival time and NPO 4hr prior.   Oncology Nurse Navigator Documentation  Oncology Nurse Navigator Flowsheets 05/31/2021  Abnormal Finding Date -  Planned Course of Treatment -  Phase of Treatment -  Chemotherapy Actual Start Date: -  Navigator Follow Up Date: 06/14/2021  Navigator Follow Up Reason: Scan Review  Navigator Location [facility]  Referral Date to RadOnc/MedOnc -  Navigator Encounter Type Appt/Treatment Plan Review;Telephone  Telephone Appt Confirmation/Clarification;Education;Outgoing Call  Treatment Initiated Date -  Patient Visit Type MedOnc  Treatment Phase Active Tx  Barriers/Navigation Needs Coordination of Care;Education  Education Other  Interventions Coordination of Care;Education  Acuity Level 2-Minimal Needs (1-2 Barriers Identified)  Referrals -  Coordination of Care Radiology  Education Method Verbal;Teach-back  Support Groups/Services Friends and Family  Time Spent with Patient 30

## 2021-06-14 ENCOUNTER — Ambulatory Visit (HOSPITAL_COMMUNITY): Payer: Medicare PPO

## 2021-06-16 ENCOUNTER — Ambulatory Visit (HOSPITAL_COMMUNITY)
Admission: RE | Admit: 2021-06-16 | Discharge: 2021-06-16 | Disposition: A | Discharge status: Home / Self Care | Payer: Medicare PPO | Source: Ambulatory Visit | Attending: Hematology & Oncology | Admitting: Hematology & Oncology

## 2021-06-16 ENCOUNTER — Other Ambulatory Visit: Payer: Self-pay

## 2021-06-16 DIAGNOSIS — C23 Malignant neoplasm of gallbladder: Secondary | ICD-10-CM | POA: Diagnosis present

## 2021-06-16 IMAGING — MR MR ABDOMEN WO/W CM
18 series · 48 of 48 positions shown · IV contrast (gadavist)
Comparison: [DATE]

CLINICAL DATA: Gallbladder cancer, assess treatment response,
ongoing chemotherapy

EXAM:
MRI ABDOMEN WITHOUT AND WITH CONTRAST
TECHNIQUE: Multiplanar multisequence MR imaging of the abdomen was performed
both before and after the administration of intravenous contrast.
CONTRAST:  7mL GADAVIST GADOBUTROL 1 MMOL/ML IV SOLN

[Series 2: DWI · axial · 6.0mm · 1.49mm/px · z∈[-51,+201]mm · 4 of 72 slices shown (1 of 2)]
[im 1/72]
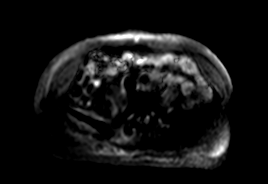
[im 24/72]
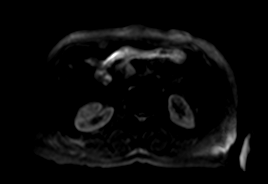
[im 48/72]
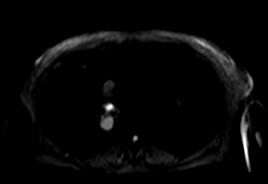
[im 72/72]
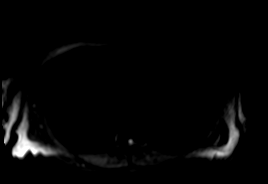

[Series 3: DWI · axial · 6.0mm · 1.49mm/px · z∈[-51,+201]mm · 2 of 36 slices shown (2 of 2)]
[im 1/36]
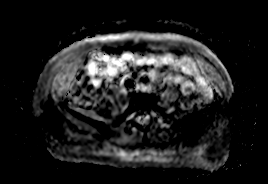
[im 36/36]
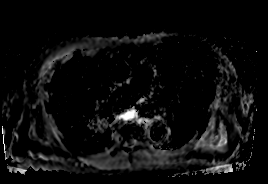

[Series 4: T2 fat-sat · axial · 6.0mm · 1.25mm/px · z∈[-63,+189]mm · 2 of 36 slices shown]
[im 1/36]
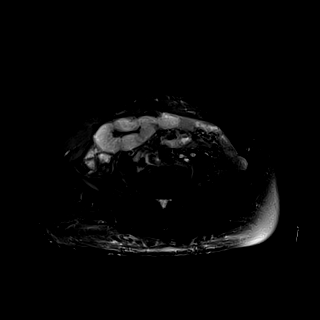
[im 36/36]
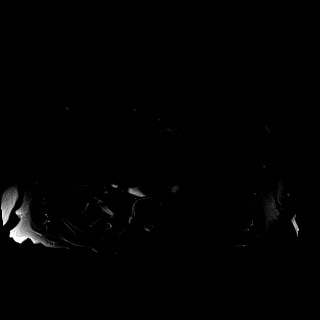

[Series 7: T2 · coronal · 6.0mm · 1.48mm/px · 2 of 33 slices shown (1 of 2)]
[im 1/33]
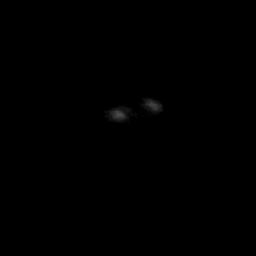
[im 33/33]
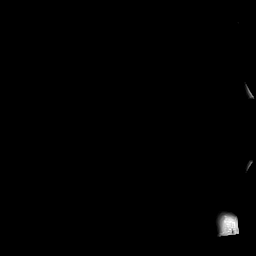

[Series 8: T1 · axial · 3.0mm · 1.19mm/px · z∈[-19,+194]mm · 3 of 72 slices shown (1 of 2)]
[im 1/72]
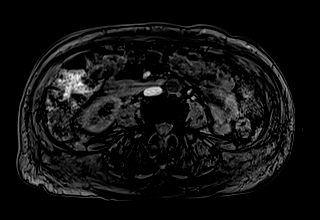
[im 36/72]
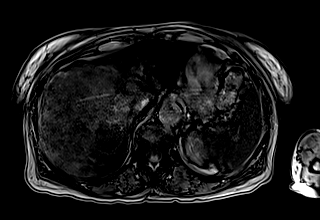
[im 72/72]
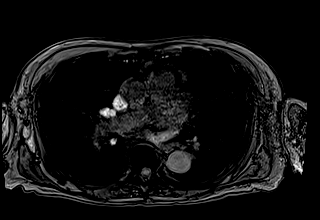

[Series 9: T1 · axial · 3.0mm · 1.19mm/px · z∈[-19,+194]mm · 3 of 72 slices shown (2 of 2)]
[im 1/72]
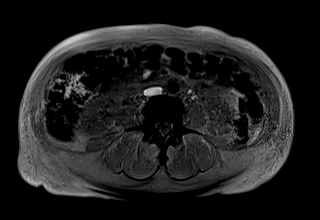
[im 36/72]
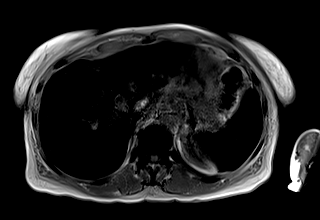
[im 72/72]
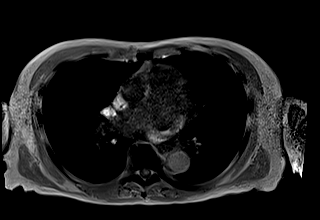

[Series 10: bSSFP · axial · 4.0mm · 0.74mm/px · z∈[-26,+194]mm · 2 of 56 slices shown]
[im 1/56]
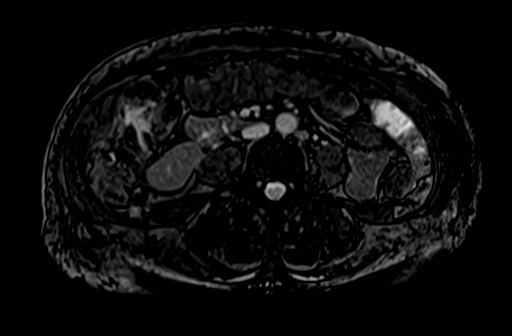
[im 56/56]
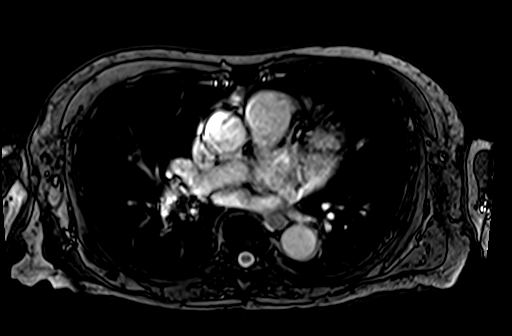

[Series 12: T1 dynamic · axial · 3.0mm · 1.19mm/px · z∈[-34,+203]mm · 3 of 80 slices shown (1 of 10)]
[im 1/80]
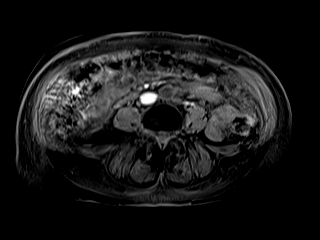
[im 40/80]
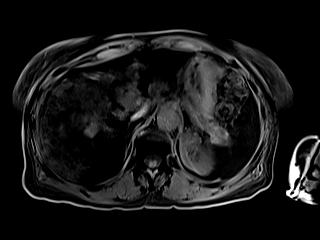
[im 80/80]
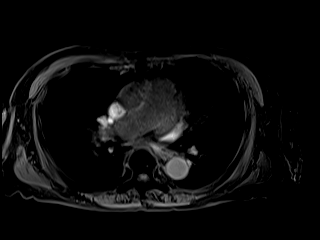

[Series 16: T1 dynamic · axial · 3.0mm · 1.19mm/px · z∈[-34,+203]mm · 3 of 80 slices shown (2 of 10)]
[im 1/80]
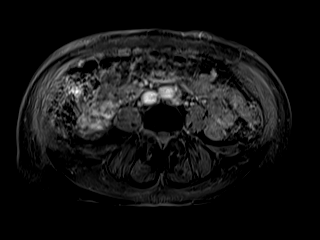
[im 40/80]
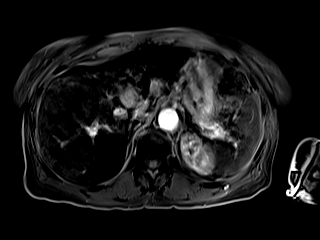
[im 80/80]
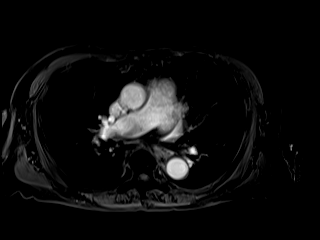

[Series 17: T1 dynamic · axial · 3.0mm · 1.19mm/px · z∈[-34,+203]mm · 3 of 80 slices shown (3 of 10)]
[im 1/80]
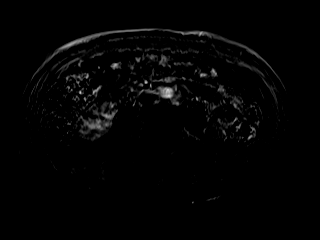
[im 40/80]
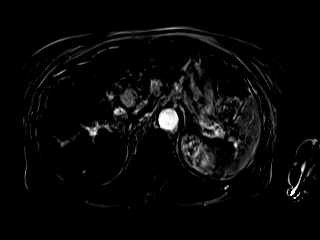
[im 80/80]
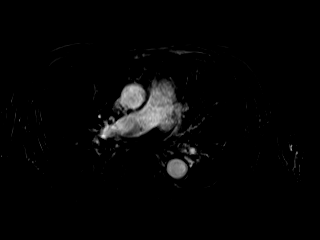

[Series 20: T1 dynamic · axial · 3.0mm · 1.19mm/px · z∈[-34,+203]mm · 3 of 80 slices shown (4 of 10)]
[im 1/80]
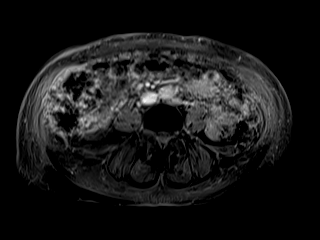
[im 40/80]
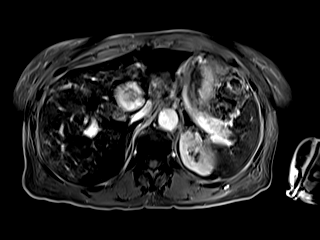
[im 80/80]
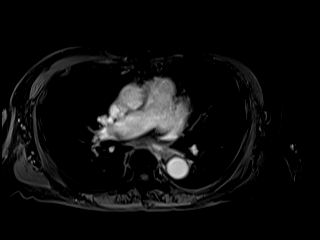

[Series 21: T1 dynamic · axial · 3.0mm · 1.19mm/px · z∈[-34,+203]mm · 3 of 80 slices shown (5 of 10)]
[im 1/80]
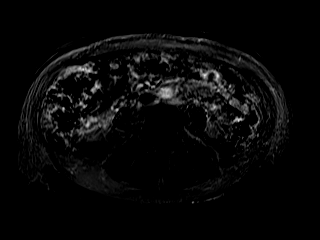
[im 40/80]
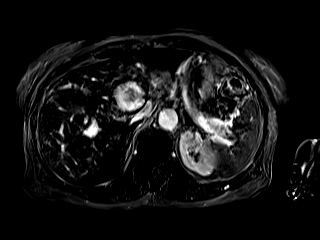
[im 80/80]
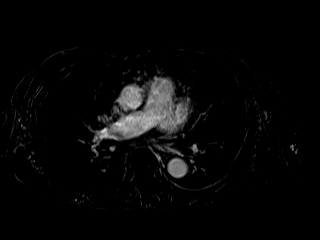

[Series 24: T1 dynamic · axial · 3.0mm · 1.19mm/px · z∈[-34,+203]mm · 3 of 80 slices shown (6 of 10)]
[im 1/80]
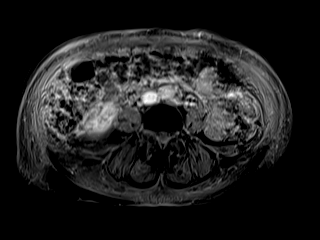
[im 40/80]
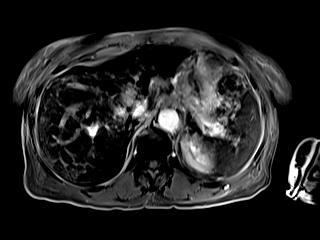
[im 80/80]
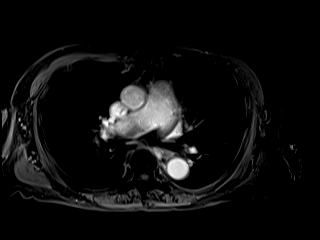

[Series 25: T1 dynamic · axial · 3.0mm · 1.19mm/px · z∈[-34,+203]mm · 3 of 80 slices shown (7 of 10)]
[im 1/80]
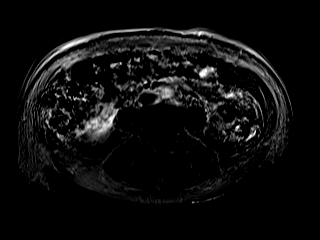
[im 40/80]
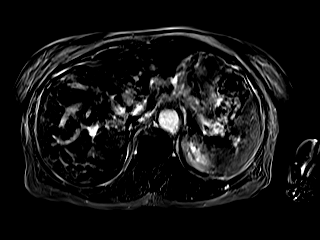
[im 80/80]
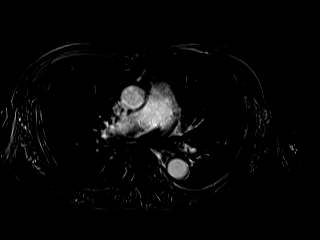

[Series 27: T1 dynamic · coronal · 4.0mm · 1.41mm/px · 2 of 60 slices shown (8 of 10)]
[im 1/60]
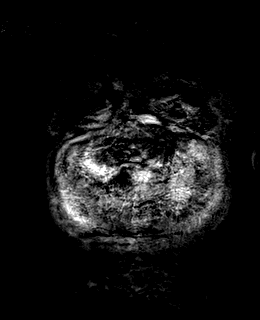
[im 60/60]
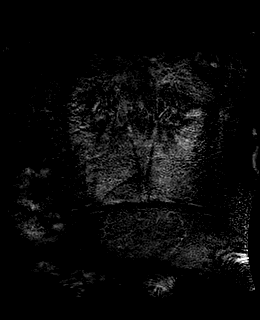

[Series 28: T2 · axial · 6.0mm · 1.48mm/px · 1 of 35 slices shown (2 of 2)]
[im 1/35]
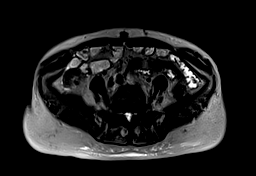

[Series 31: T1 dynamic · axial · 3.0mm · 1.19mm/px · z∈[-34,+203]mm · 3 of 80 slices shown (9 of 10)]
[im 1/80]
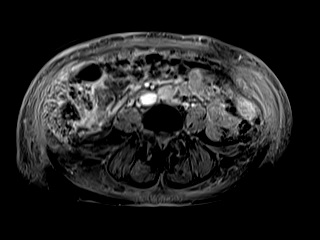
[im 40/80]
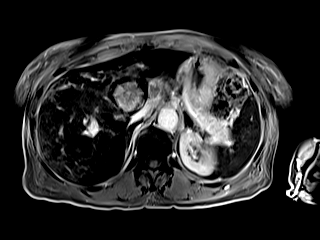
[im 80/80]
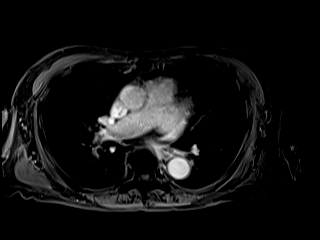

[Series 32: T1 dynamic · axial · 3.0mm · 1.19mm/px · z∈[-34,+203]mm · 3 of 80 slices shown (10 of 10)]
[im 1/80]
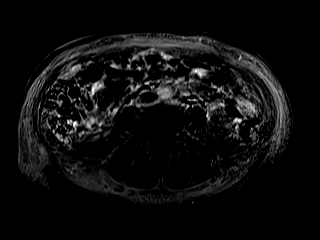
[im 40/80]
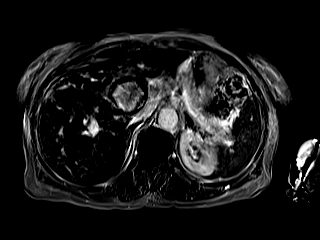
[im 80/80]
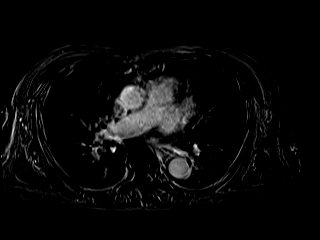

[48 of 48 positions shown; findings below may reference images not displayed]

FINDINGS: Lower chest: No acute findings.

Hepatobiliary: Hepatic iron deposition. Examination of the liver
parenchyma is limited by breath motion artifact and susceptibility
artifact from hepatic parenchymal iron deposition. Distal within
this limitation, no significant change in a soft tissue nodule in
the gallbladder fossa, measuring 3.9 x 2.8 cm. Multiple new and
enlarged liver lesions, generally best seen on T2 sequences, an
index nodule in the liver dome, hepatic segment VIII measuring 2.0 x
1.5 cm, previously 1.3 x 0.9 cm (series 10, image 13), an additional
index nodule in the central liver measuring 2.3 x 2.2 cm, previously
1.6 x 1.4 cm (series 10, image 23), and an additional index nodule
in the caudate measuring 3.8 x 3.5 cm, previously 1.0 x 1.0 cm
(series 10, image 34). Intra and extrahepatic biliary ductal
dilatation, similar to prior examination, central common bile duct
measuring up to 1.7 cm in caliber.

Pancreas: No mass, inflammatory changes, or other parenchymal
abnormality identified.

Spleen:  Within normal limits in size.  Splenic iron deposition.

Adrenals/Urinary Tract: No masses identified. No evidence of
hydronephrosis.

Stomach/Bowel: Visualized portions within the abdomen are
unremarkable.

Vascular/Lymphatic: No pathologically enlarged lymph nodes
identified. No abdominal aortic aneurysm demonstrated.

Other:  None.

Musculoskeletal: No suspicious bone lesions identified. Bone marrow
reticuloendothelial iron deposition.
IMPRESSION: 1. Examination of the liver is significantly limited by breath
motion artifact and susceptibility artifact from iron deposition.
Within this limitation, multiple new and enlarged liver lesions,
generally best seen on T2 sequences, with some associated contrast
enhancement, consistent with worsened hepatic metastatic disease.
2. Soft tissue nodule in the gallbladder fossa, presumably primary
lesion, is unchanged.
3. Intra and extrahepatic biliary ductal dilatation, similar to
prior examination. No obstructing etiology evident to the ampulla.
4. Hepatic, splenic, and bone marrow iron deposition.

## 2021-06-16 MED ORDER — GADOBUTROL 1 MMOL/ML IV SOLN
7.0000 mL | Freq: Once | INTRAVENOUS | Status: AC | PRN
  Administered 2021-06-16: 7 mL via INTRAVENOUS

## 2021-06-17 ENCOUNTER — Encounter: Payer: Self-pay | Admitting: Hematology & Oncology

## 2021-06-17 ENCOUNTER — Inpatient Hospital Stay: Payer: Medicare PPO

## 2021-06-17 ENCOUNTER — Inpatient Hospital Stay: Payer: Medicare PPO | Attending: Hematology & Oncology

## 2021-06-17 ENCOUNTER — Inpatient Hospital Stay (HOSPITAL_BASED_OUTPATIENT_CLINIC_OR_DEPARTMENT_OTHER): Payer: Medicare PPO | Admitting: Hematology & Oncology

## 2021-06-17 ENCOUNTER — Encounter: Payer: Self-pay | Admitting: *Deleted

## 2021-06-17 VITALS — BP 176/73 | HR 70 | Temp 97.6°F | Resp 18

## 2021-06-17 VITALS — Wt 150.0 lb

## 2021-06-17 DIAGNOSIS — D500 Iron deficiency anemia secondary to blood loss (chronic): Secondary | ICD-10-CM | POA: Diagnosis not present

## 2021-06-17 DIAGNOSIS — R600 Localized edema: Secondary | ICD-10-CM | POA: Insufficient documentation

## 2021-06-17 DIAGNOSIS — Z8639 Personal history of other endocrine, nutritional and metabolic disease: Secondary | ICD-10-CM | POA: Diagnosis not present

## 2021-06-17 DIAGNOSIS — C787 Secondary malignant neoplasm of liver and intrahepatic bile duct: Secondary | ICD-10-CM | POA: Diagnosis not present

## 2021-06-17 DIAGNOSIS — Z79899 Other long term (current) drug therapy: Secondary | ICD-10-CM | POA: Insufficient documentation

## 2021-06-17 DIAGNOSIS — C23 Malignant neoplasm of gallbladder: Secondary | ICD-10-CM | POA: Insufficient documentation

## 2021-06-17 DIAGNOSIS — R978 Other abnormal tumor markers: Secondary | ICD-10-CM | POA: Diagnosis not present

## 2021-06-17 DIAGNOSIS — D701 Agranulocytosis secondary to cancer chemotherapy: Secondary | ICD-10-CM | POA: Insufficient documentation

## 2021-06-17 DIAGNOSIS — Z95828 Presence of other vascular implants and grafts: Secondary | ICD-10-CM

## 2021-06-17 LAB — CBC WITH DIFFERENTIAL (CANCER CENTER ONLY)
Abs Immature Granulocytes: 0.01 10*3/uL (ref 0.00–0.07)
Basophils Absolute: 0 10*3/uL (ref 0.0–0.1)
Basophils Relative: 0 %
Eosinophils Absolute: 0 10*3/uL (ref 0.0–0.5)
Eosinophils Relative: 1 %
HCT: 34.2 % — ABNORMAL LOW (ref 36.0–46.0)
Hemoglobin: 10.7 g/dL — ABNORMAL LOW (ref 12.0–15.0)
Immature Granulocytes: 1 %
Lymphocytes Relative: 23 %
Lymphs Abs: 0.4 10*3/uL — ABNORMAL LOW (ref 0.7–4.0)
MCH: 31.8 pg (ref 26.0–34.0)
MCHC: 31.3 g/dL (ref 30.0–36.0)
MCV: 101.8 fL — ABNORMAL HIGH (ref 80.0–100.0)
Monocytes Absolute: 0.5 10*3/uL (ref 0.1–1.0)
Monocytes Relative: 30 %
Neutro Abs: 0.7 10*3/uL — ABNORMAL LOW (ref 1.7–7.7)
Neutrophils Relative %: 45 %
Platelet Count: 107 10*3/uL — ABNORMAL LOW (ref 150–400)
RBC: 3.36 MIL/uL — ABNORMAL LOW (ref 3.87–5.11)
RDW: 19.4 % — ABNORMAL HIGH (ref 11.5–15.5)
WBC Count: 1.5 10*3/uL — ABNORMAL LOW (ref 4.0–10.5)
nRBC: 0 % (ref 0.0–0.2)

## 2021-06-17 LAB — CMP (CANCER CENTER ONLY)
ALT: 16 U/L (ref 0–44)
AST: 22 U/L (ref 15–41)
Albumin: 3.7 g/dL (ref 3.5–5.0)
Alkaline Phosphatase: 156 U/L — ABNORMAL HIGH (ref 38–126)
Anion gap: 7 (ref 5–15)
BUN: 32 mg/dL — ABNORMAL HIGH (ref 8–23)
CO2: 31 mmol/L (ref 22–32)
Calcium: 9.3 mg/dL (ref 8.9–10.3)
Chloride: 101 mmol/L (ref 98–111)
Creatinine: 1.31 mg/dL — ABNORMAL HIGH (ref 0.44–1.00)
GFR, Estimated: 41 mL/min — ABNORMAL LOW (ref >60)
Glucose, Bld: 132 mg/dL — ABNORMAL HIGH (ref 70–99)
Potassium: 3.6 mmol/L (ref 3.5–5.1)
Sodium: 139 mmol/L (ref 135–145)
Total Bilirubin: 0.3 mg/dL (ref 0.3–1.2)
Total Protein: 6.2 g/dL — ABNORMAL LOW (ref 6.5–8.1)

## 2021-06-17 LAB — TSH: TSH: 1.321 u[IU]/mL (ref 0.350–4.500)

## 2021-06-17 LAB — RETICULOCYTES
Immature Retic Fract: 15.6 % (ref 2.3–15.9)
RBC.: 3.33 MIL/uL — ABNORMAL LOW (ref 3.87–5.11)
Retic Count, Absolute: 61.6 10*3/uL (ref 19.0–186.0)
Retic Ct Pct: 1.9 % (ref 0.4–3.1)

## 2021-06-17 LAB — FERRITIN: Ferritin: 814 ng/mL — ABNORMAL HIGH (ref 11–307)

## 2021-06-17 LAB — IRON AND TIBC
Iron: 43 ug/dL (ref 28–170)
Saturation Ratios: 15 % (ref 10.4–31.8)
TIBC: 287 ug/dL (ref 250–450)
UIBC: 244 ug/dL

## 2021-06-17 MED ORDER — HEPARIN SOD (PORK) LOCK FLUSH 100 UNIT/ML IV SOLN
500.0000 [IU] | Freq: Once | INTRAVENOUS | Status: AC
  Administered 2021-06-17: 500 [IU] via INTRAVENOUS

## 2021-06-17 MED ORDER — SODIUM CHLORIDE 0.9% FLUSH
10.0000 mL | Freq: Once | INTRAVENOUS | Status: AC
  Administered 2021-06-17: 10 mL via INTRAVENOUS

## 2021-06-17 NOTE — Patient Instructions (Signed)
[facility]  Discharge Instructions: Thank you for choosing [facility] to provide your oncology and hematology care.   If you have a lab appointment with the [facility], please go directly to the [facility] and check in at the registration area.  Wear comfortable clothing and clothing appropriate for easy access to any Portacath or PICC line.   We strive to give you quality time with your provider. You may need to reschedule your appointment if you arrive late (15 or more minutes).  Arriving late affects you and other patients whose appointments are after yours.  Also, if you miss three or more appointments without notifying the office, you may be dismissed from the clinic at the provider's discretion.      For prescription refill requests, have your pharmacy contact our office and allow 72 hours for refills to be completed.    Today you received the following chemotherapy and/or immunotherapy agents port-a-cath flush and lab draw    To help prevent nausea and vomiting after your treatment, we encourage you to take your nausea medication as directed.  BELOW ARE SYMPTOMS THAT SHOULD BE REPORTED IMMEDIATELY: *FEVER GREATER THAN 100.4 F (38 C) OR HIGHER *CHILLS OR SWEATING *NAUSEA AND VOMITING THAT IS NOT CONTROLLED WITH YOUR NAUSEA MEDICATION *UNUSUAL SHORTNESS OF BREATH *UNUSUAL BRUISING OR BLEEDING *URINARY PROBLEMS (pain or burning when urinating, or frequent urination) *BOWEL PROBLEMS (unusual diarrhea, constipation, pain near the anus) TENDERNESS IN MOUTH AND THROAT WITH OR WITHOUT PRESENCE OF ULCERS (sore throat, sores in mouth, or a toothache) UNUSUAL RASH, SWELLING OR PAIN  UNUSUAL VAGINAL DISCHARGE OR ITCHING   Items with * indicate a potential emergency and should be followed up as soon as possible or go to the Emergency Department if any problems should occur.  Please show the CHEMOTHERAPY ALERT CARD or IMMUNOTHERAPY ALERT CARD  at check-in to the Emergency Department and triage nurse. Should you have questions after your visit or need to cancel or reschedule your appointment, please contact [facility]  [phone] and follow the prompts.  Office hours are 8:00 a.m. to 4:30 p.m. Monday - Friday. Please note that voicemails left after 4:00 p.m. may not be returned until the following business day.  We are closed weekends and major holidays. You have access to a nurse at all times for urgent questions. Please call the main number to the clinic [phone] and follow the prompts.  For any non-urgent questions, you may also contact your provider using MyChart. We now offer e-Visits for anyone 11 and older to request care online for non-urgent symptoms. For details visit [email or web address].   Also download the MyChart app! Go to the app store, search "MyChart", open the app, select [facility], and log in with your MyChart username and password.  Due to Covid, a mask is required upon entering the hospital/clinic. If you do not have a mask, one will be given to you upon arrival. For doctor visits, patients may have 1 support person aged 59 or older with them. For treatment visits, patients cannot have anyone with them due to current Covid guidelines and our immunocompromised population.

## 2021-06-17 NOTE — Progress Notes (Signed)
Hematology and Oncology Follow Up Visit  Vanessa Garrett [id number] Dec 28, 1940 80 y.o. 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   Interim History:  Vanessa Garrett is here today for follow-up and treatment.  Vanessa Garrett had her MRI done yesterday.  Unfortunately, we do not have the result back yet.  Vanessa Garrett has admitted up to her home in [place].  Vanessa Garrett always enjoys going back home.  Vanessa Garrett did not go outside much Vanessa Garrett said but Vanessa Garrett enjoyed watching what was going on outside her windows.  Her last CA 19-9 was 191.  This is been trending upward a little bit.  We will see what it is today.  Vanessa Garrett is doing the Retacrit twice a day at home.  I told her son to increase it to 3 times a day since her hemoglobin is dropping slowly.  Vanessa Garrett did get some iron last time Vanessa Garrett was here.  At that time, her ferritin was 1000 but iron saturation was only 15%.  A lot of the ferritin elevation is from her cancer in the liver.  Vanessa Garrett still has some leg swelling.  I know Vanessa Garrett is on some diuretics.  Vanessa Garrett has had no problem with bowels or bladder.  There is been no bleeding.  Vanessa Garrett has had no cough.  Overall, her performance status is ECOG 2.    Medications:  Allergies as of 06/17/2021       Reactions   Megace Es [megestrol Acetate] Swelling   Face swelling        Medication List        Accurate as of June 17, 2021 11:14 AM. If you have any questions, ask your nurse or doctor.          dexamethasone 4 MG tablet Commonly known as: DECADRON Take 2 tablets (8 mg total) by mouth daily. Start the day after carboplatin  chemotherapy for 3 days.   dronabinol 2.5 MG capsule Commonly known as: MARINOL Take 1 capsule (2.5 mg total) by mouth 2 (two) times daily before a meal.   furosemide 40 MG tablet Commonly known as: LASIX Take 40 mg by mouth daily.   Generlac 10 GM/15ML Soln Generic drug: lactulose (encephalopathy) Take 10 g by mouth daily as needed (constipation).   lidocaine-prilocaine cream Commonly known as: EMLA Apply to affected area once   metoprolol tartrate 25 MG tablet Commonly known as: LOPRESSOR TAKE 1 TABLET(25 MG) BY MOUTH TWICE DAILY   NON FORMULARY Take 2 tablets by mouth daily. Mega Food - Blood Builder   NON FORMULARY 1.25 mg every other day. Beet Root Powder   potassium chloride SA 20 MEQ tablet Commonly known as: KLOR-CON TAKE 1 TABLET BY MOUTH EVERY DAY   Retacrit 10000 UNIT/ML injection Generic drug: epoetin alfa-epbx INJECT 1 ML (10,000 UNITS TOTAL) SUBCUTANEOUSLY DAILY.   S.S.S. TONIC PO Take 45 mLs by mouth every other day.   triamterene-hydrochlorothiazide 75-50 MG tablet Commonly known as: MAXZIDE Take 1 tablet by mouth daily.        Allergies:  Allergies  Allergen Reactions   Megace Es [Megestrol Acetate]  Swelling    Face swelling    Past Medical History, Surgical history, Social history, and Family History were reviewed and updated.  Review of Systems: Review of Systems  Constitutional:  Positive for malaise/fatigue and weight loss.  HENT: Negative.    Eyes: Negative.   Respiratory: Negative.    Cardiovascular:  Positive for leg swelling.  Gastrointestinal:  Positive for nausea.  Genitourinary: Negative.   Musculoskeletal: Negative.   Skin: Negative.   Neurological: Negative.   Endo/Heme/Allergies: Negative.   Psychiatric/Behavioral: Negative.    Marland Kitchen   Physical Exam:  weight is 150 lb (68 kg).   Wt Readings from Last 3 Encounters:  06/17/21 150 lb (68 kg)  05/27/21 150 lb (68 kg)  04/28/21 141 lb 1.9 oz (64 kg)    Physical  Exam Vitals reviewed.  HENT:     Head: Normocephalic and atraumatic.  Eyes:     Pupils: Pupils are equal, round, and reactive to light.  Cardiovascular:     Rate and Rhythm: Normal rate and regular rhythm.     Heart sounds: Normal heart sounds.  Pulmonary:     Effort: Pulmonary effort is normal.     Breath sounds: Normal breath sounds.  Abdominal:     General: Bowel sounds are normal.     Palpations: Abdomen is soft.  Musculoskeletal:        General: No tenderness or deformity. Normal range of motion.     Cervical back: Normal range of motion.     Comments: Her extremities does show some swelling in the legs.  There might be a little bit more swelling in the right leg than in the left leg.  Lymphadenopathy:     Cervical: No cervical adenopathy.  Skin:    General: Skin is warm and dry.     Findings: No erythema or rash.  Neurological:     Mental Status: Vanessa Garrett is alert and oriented to person, place, and time.  Psychiatric:        Behavior: Behavior normal.        Thought Content: Thought content normal.        Judgment: Judgment normal.     Lab Results  Component Value Date   WBC 1.5 (L) 06/17/2021   HGB 10.7 (L) 06/17/2021   HCT 34.2 (L) 06/17/2021   MCV 101.8 (H) 06/17/2021   PLT 107 (L) 06/17/2021   Lab Results  Component Value Date   FERRITIN 1,081 (H) 05/27/2021   IRON 33 (L) 05/27/2021   TIBC 221 (L) 05/27/2021   UIBC 188 05/27/2021   IRONPCTSAT 15 (L) 05/27/2021   Lab Results  Component Value Date   RETICCTPCT 1.9 06/17/2021   RBC 3.33 (L) 06/17/2021   No results found for: KPAFRELGTCHN, LAMBDASER, KAPLAMBRATIO No results found for: IGGSERUM, IGA, IGMSERUM No results found for: Odetta Pink, SPEI   Chemistry      Component Value Date/Time   NA 139 06/17/2021 1035   K 3.6 06/17/2021 1035   CL 101 06/17/2021 1035   CO2 31 06/17/2021 1035   BUN 32 (H) 06/17/2021 1035   CREATININE 1.31 (H) 06/17/2021  1035      Component Value Date/Time   CALCIUM 9.3 06/17/2021 1035   ALKPHOS 156 (H) 06/17/2021 1035   AST 22 06/17/2021 1035   ALT 16 06/17/2021 1035   BILITOT 0.3 06/17/2021 1035       Impression and Plan: Vanessa Garrett is a very pleasant 80 yo  African American female with stage IV adenocarcinoma of the gallbladder.   I really wish we would have the MRI results back.  Unfortunately we do not.  Regardless, I am not going to treat her today.  I just think her white cell count is too low.  Vanessa Garrett has had problems with neutropenia in the past and has been hospitalized.  I do not want to see her back in the hospital.  I realize that her monocytes are on the high side so her white cell count is back up.  We will have her come in for labs and chemo next Friday if her MRI looks okay.  I will see her back myself back before Thanksgiving.   Volanda Napoleon, MD 10/21/202211:14 AM

## 2021-06-17 NOTE — Progress Notes (Signed)
Patient treatment will be held. Will determine next steps based on MRI, which still hasn't resulted from 06/16/2021.  Oncology Nurse Navigator Documentation  Oncology Nurse Navigator Flowsheets 06/17/2021  Abnormal Finding Date -  Planned Course of Treatment -  Phase of Treatment -  Chemotherapy Actual Start Date: -  Navigator Follow Up Date: 06/20/2021  Navigator Follow Up Reason: Scan Review  Navigator Location [facility]  Referral Date to RadOnc/MedOnc -  Navigator Encounter Type Appt/Treatment Plan Review  Telephone -  Treatment Initiated Date -  Patient Visit Type MedOnc  Treatment Phase Active Tx  Barriers/Navigation Needs Coordination of Care;Education  Education -  Interventions None Required  Acuity Level 2-Minimal Needs (1-2 Barriers Identified)  Referrals -  Coordination of Care -  Education Method -  Support Groups/Services Friends and Family  Time Spent with Patient 15

## 2021-06-17 NOTE — Addendum Note (Signed)
Addended by: Fabio Neighbors A on: 06/17/2021 10:48 AM   Modules accepted: Orders

## 2021-06-18 LAB — CANCER ANTIGEN 19-9: CA 19-9: 280 U/mL — ABNORMAL HIGH (ref 0–35)

## 2021-06-18 LAB — T4: T4, Total: 8.5 ug/dL (ref 4.5–12.0)

## 2021-06-20 ENCOUNTER — Other Ambulatory Visit: Payer: Self-pay | Admitting: *Deleted

## 2021-06-20 ENCOUNTER — Encounter: Payer: Self-pay | Admitting: Hematology & Oncology

## 2021-06-20 ENCOUNTER — Telehealth: Payer: Self-pay | Admitting: *Deleted

## 2021-06-20 ENCOUNTER — Encounter: Payer: Self-pay | Admitting: *Deleted

## 2021-06-20 DIAGNOSIS — C23 Malignant neoplasm of gallbladder: Secondary | ICD-10-CM

## 2021-06-20 MED ORDER — POTASSIUM CHLORIDE CRYS ER 20 MEQ PO TBCR: 20.0000 meq | EXTENDED_RELEASE_TABLET | Freq: Every day | ORAL | 2 refills | Status: DC

## 2021-06-20 MED ORDER — METOPROLOL TARTRATE 25 MG PO TABS: ORAL_TABLET | ORAL | 2 refills | Status: DC

## 2021-06-20 MED ORDER — DRONABINOL 2.5 MG PO CAPS: 2.5000 mg | ORAL_CAPSULE | Freq: Two times a day (BID) | ORAL | 0 refills | Status: DC

## 2021-06-20 NOTE — Progress Notes (Signed)
Patient's MRI returned showing progression. She is currently scheduled for treatment this Friday, however per Dr Antonieta Pert note "We will have her come in for labs and chemo next Friday if her MRI looks okay."  Dr Marin Olp is out of the office this week, but will be checking his inbasket. Message sent to clarify what the plan is with this patient.   Oncology Nurse Navigator Documentation  Oncology Nurse Navigator Flowsheets 06/20/2021  Abnormal Finding Date -  Planned Course of Treatment -  Phase of Treatment -  Chemotherapy Actual Start Date: -  Navigator Follow Up Date: 06/22/2021  Navigator Follow Up Reason: Appointment Review  Navigator Location [facility]  Referral Date to RadOnc/MedOnc -  Navigator Encounter Type Scan Review  Telephone -  Treatment Initiated Date -  Patient Visit Type MedOnc  Treatment Phase Active Tx  Barriers/Navigation Needs Coordination of Care;Education  Education -  Interventions Coordination of Care  Acuity Level 2-Minimal Needs (1-2 Barriers Identified)  Referrals -  Coordination of Care Other  Education Method -  Support Groups/Services Friends and Family  Time Spent with Patient 30

## 2021-06-20 NOTE — Telephone Encounter (Signed)
Per 06/17/21 los - called and gave upcoming appointments - confirmed

## 2021-06-21 ENCOUNTER — Encounter: Payer: Self-pay | Admitting: *Deleted

## 2021-06-21 ENCOUNTER — Telehealth: Payer: Self-pay | Admitting: *Deleted

## 2021-06-21 NOTE — Telephone Encounter (Signed)
Per secure chat - inbasket Jamie- Dr. Marin Olp - called patient and gave upcoming appointment - confirmed.

## 2021-06-21 NOTE — Progress Notes (Signed)
Received notification that Dr Marin Olp would like to cancel planned treatment for Friday, and bring in patient for discussion next Tuesday. Message sent to scheduling.   Oncology Nurse Navigator Documentation  Oncology Nurse Navigator Flowsheets 06/21/2021  Abnormal Finding Date -  Planned Course of Treatment -  Phase of Treatment -  Chemotherapy Actual Start Date: -  Navigator Follow Up Date: 06/28/2021  Navigator Follow Up Reason: Follow-up Appointment  Navigator Location [facility]  Referral Date to RadOnc/MedOnc -  Navigator Encounter Type Appt/Treatment Plan Review  Telephone -  Treatment Initiated Date -  Patient Visit Type MedOnc  Treatment Phase Active Tx  Barriers/Navigation Needs Coordination of Care;Education  Education -  Interventions Coordination of Care  Acuity Level 2-Minimal Needs (1-2 Barriers Identified)  Referrals -  Coordination of Care Appts  Education Method -  Support Groups/Services Friends and Family  Time Spent with Patient 15

## 2021-06-24 ENCOUNTER — Other Ambulatory Visit: Payer: Medicare PPO

## 2021-06-24 ENCOUNTER — Ambulatory Visit: Payer: Medicare PPO

## 2021-06-28 ENCOUNTER — Encounter: Payer: Self-pay | Admitting: *Deleted

## 2021-06-28 ENCOUNTER — Other Ambulatory Visit: Payer: Self-pay

## 2021-06-28 ENCOUNTER — Encounter: Payer: Self-pay | Admitting: Hematology & Oncology

## 2021-06-28 ENCOUNTER — Inpatient Hospital Stay: Payer: Medicare PPO | Attending: Hematology & Oncology

## 2021-06-28 ENCOUNTER — Inpatient Hospital Stay: Payer: Medicare PPO

## 2021-06-28 ENCOUNTER — Inpatient Hospital Stay (HOSPITAL_BASED_OUTPATIENT_CLINIC_OR_DEPARTMENT_OTHER): Payer: Medicare PPO | Admitting: Hematology & Oncology

## 2021-06-28 VITALS — BP 175/79 | HR 63 | Temp 97.8°F | Resp 18 | Wt 150.5 lb

## 2021-06-28 DIAGNOSIS — C787 Secondary malignant neoplasm of liver and intrahepatic bile duct: Secondary | ICD-10-CM | POA: Insufficient documentation

## 2021-06-28 DIAGNOSIS — C23 Malignant neoplasm of gallbladder: Secondary | ICD-10-CM | POA: Diagnosis not present

## 2021-06-28 DIAGNOSIS — Z5111 Encounter for antineoplastic chemotherapy: Secondary | ICD-10-CM | POA: Diagnosis present

## 2021-06-28 DIAGNOSIS — D500 Iron deficiency anemia secondary to blood loss (chronic): Secondary | ICD-10-CM | POA: Diagnosis not present

## 2021-06-28 DIAGNOSIS — Z79899 Other long term (current) drug therapy: Secondary | ICD-10-CM | POA: Diagnosis not present

## 2021-06-28 DIAGNOSIS — K909 Intestinal malabsorption, unspecified: Secondary | ICD-10-CM

## 2021-06-28 DIAGNOSIS — N289 Disorder of kidney and ureter, unspecified: Secondary | ICD-10-CM | POA: Insufficient documentation

## 2021-06-28 DIAGNOSIS — C779 Secondary and unspecified malignant neoplasm of lymph node, unspecified: Secondary | ICD-10-CM | POA: Insufficient documentation

## 2021-06-28 DIAGNOSIS — Z5112 Encounter for antineoplastic immunotherapy: Secondary | ICD-10-CM | POA: Diagnosis present

## 2021-06-28 LAB — CMP (CANCER CENTER ONLY)
ALT: 11 U/L (ref 0–44)
AST: 22 U/L (ref 15–41)
Albumin: 3.6 g/dL (ref 3.5–5.0)
Alkaline Phosphatase: 176 U/L — ABNORMAL HIGH (ref 38–126)
Anion gap: 7 (ref 5–15)
BUN: 19 mg/dL (ref 8–23)
CO2: 31 mmol/L (ref 22–32)
Calcium: 9.1 mg/dL (ref 8.9–10.3)
Chloride: 99 mmol/L (ref 98–111)
Creatinine: 1.38 mg/dL — ABNORMAL HIGH (ref 0.44–1.00)
GFR, Estimated: 39 mL/min — ABNORMAL LOW (ref >60)
Glucose, Bld: 181 mg/dL — ABNORMAL HIGH (ref 70–99)
Potassium: 3.3 mmol/L — ABNORMAL LOW (ref 3.5–5.1)
Sodium: 137 mmol/L (ref 135–145)
Total Bilirubin: 0.4 mg/dL (ref 0.3–1.2)
Total Protein: 6.2 g/dL — ABNORMAL LOW (ref 6.5–8.1)

## 2021-06-28 LAB — CBC WITH DIFFERENTIAL (CANCER CENTER ONLY)
Abs Immature Granulocytes: 0.04 10*3/uL (ref 0.00–0.07)
Basophils Absolute: 0 10*3/uL (ref 0.0–0.1)
Basophils Relative: 0 %
Eosinophils Absolute: 0 10*3/uL (ref 0.0–0.5)
Eosinophils Relative: 0 %
HCT: 36.3 % (ref 36.0–46.0)
Hemoglobin: 11.3 g/dL — ABNORMAL LOW (ref 12.0–15.0)
Immature Granulocytes: 1 %
Lymphocytes Relative: 15 %
Lymphs Abs: 0.4 10*3/uL — ABNORMAL LOW (ref 0.7–4.0)
MCH: 31.8 pg (ref 26.0–34.0)
MCHC: 31.1 g/dL (ref 30.0–36.0)
MCV: 102.3 fL — ABNORMAL HIGH (ref 80.0–100.0)
Monocytes Absolute: 0.5 10*3/uL (ref 0.1–1.0)
Monocytes Relative: 17 %
Neutro Abs: 1.9 10*3/uL (ref 1.7–7.7)
Neutrophils Relative %: 67 %
Platelet Count: 154 10*3/uL (ref 150–400)
RBC: 3.55 MIL/uL — ABNORMAL LOW (ref 3.87–5.11)
RDW: 18.6 % — ABNORMAL HIGH (ref 11.5–15.5)
WBC Count: 2.8 10*3/uL — ABNORMAL LOW (ref 4.0–10.5)
nRBC: 0 % (ref 0.0–0.2)

## 2021-06-28 LAB — RETICULOCYTES
Immature Retic Fract: 18.7 % — ABNORMAL HIGH (ref 2.3–15.9)
RBC.: 3.52 MIL/uL — ABNORMAL LOW (ref 3.87–5.11)
Retic Count, Absolute: 86.2 10*3/uL (ref 19.0–186.0)
Retic Ct Pct: 2.5 % (ref 0.4–3.1)

## 2021-06-28 MED ORDER — SODIUM CHLORIDE 0.9% FLUSH
10.0000 mL | Freq: Once | INTRAVENOUS | Status: AC | PRN
  Administered 2021-06-28: 10 mL

## 2021-06-28 MED ORDER — HEPARIN SOD (PORK) LOCK FLUSH 100 UNIT/ML IV SOLN
500.0000 [IU] | Freq: Once | INTRAVENOUS | Status: AC | PRN
  Administered 2021-06-28: 500 [IU]

## 2021-06-28 MED ORDER — HEPARIN SOD (PORK) LOCK FLUSH 100 UNIT/ML IV SOLN: 250.0000 [IU] | Freq: Once | INTRAVENOUS | Status: DC | PRN

## 2021-06-28 NOTE — Progress Notes (Signed)
Hematology and Oncology Follow Up Visit  Vanessa Garrett [id number] 1940-10-31 80 y.o. 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   Interim History:  Vanessa Garrett is here today for follow-up.  Unfortunately, she has progressive disease.  We did a MRI last week of her abdomen.  The MRI showed clear tumor progression.  She did get a lot of "mileage" out of the carboplatinum/Gemzar combination.  Unfortunately, they were going to have to make a change.  She still is in decent shape.  She is having no abdominal pain.  She is having no nausea or vomiting.  She is eating a little bit better..  She still has a lot of swelling in the legs.  I think a lot of the swelling is probably from decreased blood flow through her liver because of the liver metastasis.  Her last CA 19-9 was 280.  I think that she would be a candidate for FOLFOX along with the Durvalumab.  I think this would be a very reasonable combination for her.  I think she could tolerate this.  I think it would have a decent success of working.  She has had no problems with cough..  She gets the Retacrit 3 times a week at home.  She is doing okay with the Retacrit.  Overall, I think her kidney function is doing okay.  She does have some mild renal insufficiency.  I would have to say that at the present time, her performance status is ECOG 2.   Medications:  Allergies as of 06/28/2021       Reactions   Megace Es [megestrol Acetate] Swelling   Face swelling        Medication  List        Accurate as of June 28, 2021  1:33 PM. If you have any questions, ask your nurse or doctor.          STOP taking these medications    dexamethasone 4 MG tablet Commonly known as: DECADRON Stopped by: Volanda Napoleon, MD       TAKE these medications    dronabinol 2.5 MG capsule Commonly known as: MARINOL Take 1 capsule (2.5 mg total) by mouth 2 (two) times daily before a meal.   furosemide 40 MG tablet Commonly known as: LASIX Take 40 mg by mouth daily.   Generlac 10 GM/15ML Soln Generic drug: lactulose (encephalopathy) Take 10 g by mouth daily as needed (constipation).   lidocaine-prilocaine cream Commonly known as: EMLA Apply to affected area once   metoprolol tartrate 25 MG tablet Commonly known as: LOPRESSOR TAKE 1 TABLET(25 MG) BY MOUTH TWICE DAILY   NON FORMULARY Take 2 tablets by mouth daily. Mega Food - Blood Builder   NON FORMULARY 1.25 mg every other day. Beet Root Powder   potassium chloride SA 20 MEQ tablet Commonly known as: KLOR-CON Take 1 tablet (20 mEq total) by mouth daily.   Retacrit 10000 UNIT/ML injection Generic drug: epoetin alfa-epbx INJECT 1 ML (10,000 UNITS TOTAL) SUBCUTANEOUSLY DAILY.  S.S.S. TONIC PO Take 45 mLs by mouth every other day.   triamterene-hydrochlorothiazide 75-50 MG tablet Commonly known as: MAXZIDE Take 1 tablet by mouth daily.   vitamin B-12 100 MCG tablet Commonly known as: CYANOCOBALAMIN Take 100 mcg by mouth in the morning and at bedtime.   vitamin D3 50 MCG (2000 UT) Caps Take by mouth daily.        Allergies:  Allergies  Allergen Reactions   Megace Es [Megestrol Acetate] Swelling    Face swelling    Past Medical History, Surgical history, Social history, and Family History were reviewed and updated.  Review of Systems: Review of Systems  Constitutional:  Positive for malaise/fatigue and weight loss.  HENT: Negative.    Eyes: Negative.   Respiratory: Negative.     Cardiovascular:  Positive for leg swelling.  Gastrointestinal:  Positive for nausea.  Genitourinary: Negative.   Musculoskeletal: Negative.   Skin: Negative.   Neurological: Negative.   Endo/Heme/Allergies: Negative.   Psychiatric/Behavioral: Negative.    Marland Kitchen   Physical Exam:  weight is 150 lb 8 oz (68.3 kg). Her oral temperature is 97.8 F (36.6 C). Her blood pressure is 175/79 (abnormal) and her pulse is 63. Her respiration is 18 and oxygen saturation is 100%.   Wt Readings from Last 3 Encounters:  06/28/21 150 lb 8 oz (68.3 kg)  06/17/21 150 lb (68 kg)  05/27/21 150 lb (68 kg)    Physical Exam Vitals reviewed.  HENT:     Head: Normocephalic and atraumatic.  Eyes:     Pupils: Pupils are equal, round, and reactive to light.  Cardiovascular:     Rate and Rhythm: Normal rate and regular rhythm.     Heart sounds: Normal heart sounds.  Pulmonary:     Effort: Pulmonary effort is normal.     Breath sounds: Normal breath sounds.  Abdominal:     General: Bowel sounds are normal.     Palpations: Abdomen is soft.  Musculoskeletal:        General: No tenderness or deformity. Normal range of motion.     Cervical back: Normal range of motion.     Comments: Her extremities does show some swelling in the legs.  There might be a little bit more swelling in the right leg than in the left leg.  Lymphadenopathy:     Cervical: No cervical adenopathy.  Skin:    General: Skin is warm and dry.     Findings: No erythema or rash.  Neurological:     Mental Status: She is alert and oriented to person, place, and time.  Psychiatric:        Behavior: Behavior normal.        Thought Content: Thought content normal.        Judgment: Judgment normal.     Lab Results  Component Value Date   WBC 2.8 (L) 06/28/2021   HGB 11.3 (L) 06/28/2021   HCT 36.3 06/28/2021   MCV 102.3 (H) 06/28/2021   PLT 154 06/28/2021   Lab Results  Component Value Date   FERRITIN 814 (H) 06/17/2021   IRON 43  06/17/2021   TIBC 287 06/17/2021   UIBC 244 06/17/2021   IRONPCTSAT 15 06/17/2021   Lab Results  Component Value Date   RETICCTPCT 2.5 06/28/2021   RBC 3.55 (L) 06/28/2021   RBC 3.52 (L) 06/28/2021   No results found for: KPAFRELGTCHN, LAMBDASER, KAPLAMBRATIO No results found for: IGGSERUM, IGA, IGMSERUM No results found for: TOTALPROTELP, ALBUMINELP,  Nils Flack, SPEI   Chemistry      Component Value Date/Time   NA 137 06/28/2021 1204   K 3.3 (L) 06/28/2021 1204   CL 99 06/28/2021 1204   CO2 31 06/28/2021 1204   BUN 19 06/28/2021 1204   CREATININE 1.38 (H) 06/28/2021 1204      Component Value Date/Time   CALCIUM 9.1 06/28/2021 1204   ALKPHOS 176 (H) 06/28/2021 1204   AST 22 06/28/2021 1204   ALT 11 06/28/2021 1204   BILITOT 0.4 06/28/2021 1204       Impression and Plan: Ms. Criss is a very pleasant 80 yo African American female with stage IV adenocarcinoma of the gallbladder.   Again, the MRI clearly shows that she has progressive disease.  We are going to have to make a change in her protocol.  I do think that FOLFOX along with Durvalumab would not be a bad idea.  I think this would be effective.  I think we would clearly know this by the decrease in her CA 19-9.  We will try to get started next week.  I went over the side effects of treatment.  I we will dose reduce the treatment by 20%.  I think this would be reasonable.  We will treat her every 3 weeks.  I think she needs an extra week off just to get her blood counts adequate.  I would probably repeat an MRI after her fourth cycle of treatment.  I will see her back myself when she has her second cycle of treatment right after Thanksgiving.   Volanda Napoleon, MD 11/1/20221:33 PM

## 2021-06-28 NOTE — Patient Instructions (Signed)
Implanted Port Home Guide An implanted port is a device that is placed under the skin. It is usually placed in the chest. The device can be used to give IV medicine, to take blood, or for dialysis. You may have an implanted port if: You need IV medicine that would be irritating to the small veins in your hands or arms. You need IV medicines, such as antibiotics, for a long period of time. You need IV nutrition for a long period of time. You need dialysis. When you have a port, your health care provider can choose to use the port instead of veins in your arms for these procedures. You may have fewer limitations when using a port than you would if you used other types of long-term IVs, and you will likely be able to return to normal activities after your incision heals. An implanted port has two main parts: Reservoir. The reservoir is the part where a needle is inserted to give medicines or draw blood. The reservoir is round. After it is placed, it appears as a small, raised area under your skin. Catheter. The catheter is a thin, flexible tube that connects the reservoir to a vein. Medicine that is inserted into the reservoir goes into the catheter and then into the vein. How is my port accessed? To access your port: A numbing cream may be placed on the skin over the port site. Your health care provider will put on a mask and sterile gloves. The skin over your port will be cleaned carefully with a germ-killing soap and allowed to dry. Your health care provider will gently pinch the port and insert a needle into it. Your health care provider will check for a blood return to make sure the port is in the vein and is not clogged. If your port needs to remain accessed to get medicine continuously (constant infusion), your health care provider will place a clear bandage (dressing) over the needle site. The dressing and needle will need to be changed every week, or as told by your health care provider. What  is flushing? Flushing helps keep the port from getting clogged. Follow instructions from your health care provider about how and when to flush the port. Ports are usually flushed with saline solution or a medicine called heparin. The need for flushing will depend on how the port is used: If the port is only used from time to time to give medicines or draw blood, the port may need to be flushed: Before and after medicines have been given. Before and after blood has been drawn. As part of routine maintenance. Flushing may be recommended every 4-6 weeks. If a constant infusion is running, the port may not need to be flushed. Throw away any syringes in a disposal container that is meant for sharp items (sharps container). You can buy a sharps container from a pharmacy, or you can make one by using an empty hard plastic bottle with a cover. How long will my port stay implanted? The port can stay in for as long as your health care provider thinks it is needed. When it is time for the port to come out, a surgery will be done to remove it. The surgery will be similar to the procedure that was done to put the port in. Follow these instructions at home:  Flush your port as told by your health care provider. If you need an infusion over several days, follow instructions from your health care provider about how   to take care of your port site. Make sure you: Wash your hands with soap and water before you change your dressing. If soap and water are not available, use alcohol-based hand sanitizer. Change your dressing as told by your health care provider. Place any used dressings or infusion bags into a plastic bag. Throw that bag in the trash. Keep the dressing that covers the needle clean and dry. Do not get it wet. Do not use scissors or sharp objects near the tube. Keep the tube clamped, unless it is being used. Check your port site every day for signs of infection. Check for: Redness, swelling, or  pain. Fluid or blood. Pus or a bad smell. Protect the skin around the port site. Avoid wearing bra straps that rub or irritate the site. Protect the skin around your port from seat belts. Place a soft pad over your chest if needed. Bathe or shower as told by your health care provider. The site may get wet as long as you are not actively receiving an infusion. Return to your normal activities as told by your health care provider. Ask your health care provider what activities are safe for you. Carry a medical alert card or wear a medical alert bracelet at all times. This will let health care providers know that you have an implanted port in case of an emergency. Get help right away if: You have redness, swelling, or pain at the port site. You have fluid or blood coming from your port site. You have pus or a bad smell coming from the port site. You have a fever. Summary Implanted ports are usually placed in the chest for long-term IV access. Follow instructions from your health care provider about flushing the port and changing bandages (dressings). Take care of the area around your port by avoiding clothing that puts pressure on the area, and by watching for signs of infection. Protect the skin around your port from seat belts. Place a soft pad over your chest if needed. Get help right away if you have a fever or you have redness, swelling, pain, drainage, or a bad smell at the port site. This information is not intended to replace advice given to you by your health care provider. Make sure you discuss any questions you have with your health care provider. Document Revised: 11/03/2020 Document Reviewed: 12/29/2019 Elsevier Patient Education  2022 [employer] Inc.  

## 2021-06-29 ENCOUNTER — Telehealth: Payer: Self-pay

## 2021-06-29 LAB — IRON AND TIBC
Iron: 34 ug/dL — ABNORMAL LOW (ref 41–142)
Saturation Ratios: 14 % — ABNORMAL LOW (ref 21–57)
TIBC: 250 ug/dL (ref 236–444)
UIBC: 216 ug/dL (ref 120–384)

## 2021-06-29 LAB — CANCER ANTIGEN 19-9: CA 19-9: 330 U/mL — ABNORMAL HIGH (ref 0–35)

## 2021-06-29 LAB — T4: T4, Total: 9.4 ug/dL (ref 4.5–12.0)

## 2021-06-29 LAB — TSH: TSH: 0.957 u[IU]/mL (ref 0.308–3.960)

## 2021-06-29 LAB — FERRITIN: Ferritin: 862 ng/mL — ABNORMAL HIGH (ref 11–307)

## 2021-06-29 NOTE — Telephone Encounter (Signed)
Spoke with pt's daughter [name] (ok per [facility]) and advised her of lab results. Sent scheduling message to call her to schedule tx and IV Iron for next week per Dr Marin Olp.

## 2021-06-29 NOTE — Telephone Encounter (Signed)
LMTCB - next apt is not scheduled until 07/19/21.

## 2021-06-29 NOTE — Telephone Encounter (Signed)
-----   Message from Volanda Napoleon, MD sent at 06/29/2021  9:18 AM EDT ----- Her iron is low.  Please make sure that she gets IV iron when she comes in for treatment next week.  Thanks so much.  Laurey Arrow

## 2021-06-30 ENCOUNTER — Encounter: Payer: Self-pay | Admitting: Hematology & Oncology

## 2021-06-30 NOTE — Progress Notes (Signed)
Patient has progressed on her current regimen. Treatment dc'd today and patient will return next week for new regimen.   Oncology Nurse Navigator Documentation  Oncology Nurse Navigator Flowsheets 06/28/2021  Abnormal Finding Date -  Planned Course of Treatment -  Phase of Treatment -  Chemotherapy Actual Start Date: -  Navigator Follow Up Date: 07/25/2021  Navigator Follow Up Reason: Follow-up Appointment  Navigator Location [facility]  Referral Date to RadOnc/MedOnc -  Navigator Encounter Type Appt/Treatment Plan Review  Telephone -  Treatment Initiated Date -  Patient Visit Type MedOnc  Treatment Phase Active Tx  Barriers/Navigation Needs Coordination of Care;Education  Education -  Interventions Coordination of Care  Acuity Level 2-Minimal Needs (1-2 Barriers Identified)  Referrals -  Coordination of Care Appts  Education Method -  Support Groups/Services Friends and Family  Time Spent with Patient 15

## 2021-07-04 ENCOUNTER — Ambulatory Visit: Payer: Medicare PPO

## 2021-07-05 ENCOUNTER — Encounter: Payer: Self-pay | Admitting: *Deleted

## 2021-07-05 NOTE — Progress Notes (Signed)
Patient's daughter, [name], calls with question about potassium dosing. They were instructed to increase her potassium dose for 5 days. She would like to know how to proceed after the 5 days have elapsed.   Instructed [name] to resume giving patient 67meq of potassium daily.   Oncology Nurse Navigator Documentation  Oncology Nurse Navigator Flowsheets 07/05/2021  Abnormal Finding Date -  Planned Course of Treatment -  Phase of Treatment -  Chemotherapy Actual Start Date: -  Navigator Follow Up Date: 07/25/2021  Navigator Follow Up Reason: Follow-up Appointment  Navigator Location [facility]  Referral Date to RadOnc/MedOnc -  Navigator Encounter Type Telephone  Telephone Medication Assistance;Incoming Call  Treatment Initiated Date -  Patient Visit Type MedOnc  Treatment Phase Active Tx  Barriers/Navigation Needs Coordination of Care;Education  Education Other  Interventions Medication Assistance  Acuity Level 2-Minimal Needs (1-2 Barriers Identified)  Referrals -  Coordination of Care -  Education Method -  Support Groups/Services Friends and Family  Time Spent with Patient 15

## 2021-07-11 ENCOUNTER — Other Ambulatory Visit: Payer: Self-pay

## 2021-07-11 DIAGNOSIS — C23 Malignant neoplasm of gallbladder: Secondary | ICD-10-CM

## 2021-07-11 MED ORDER — POTASSIUM CHLORIDE CRYS ER 20 MEQ PO TBCR: 20.0000 meq | EXTENDED_RELEASE_TABLET | Freq: Every day | ORAL | 2 refills | Status: DC

## 2021-07-12 NOTE — Progress Notes (Signed)
Patient is receiving Assistance Medication - Supplied Externally. Medication: Imfinzi (Durvalumab) Manufacture: [employer] Approval Dates: Approved from 07/12/2021 until 08/27/2022. ID: [id number] Reason: Ins denial  First DOS: TBD  Madalyn Rob, CPhT IV Drug Replacement Specialist  [place] Phone: [phone]

## 2021-07-14 ENCOUNTER — Ambulatory Visit: Payer: Medicare PPO

## 2021-07-15 ENCOUNTER — Ambulatory Visit: Payer: Medicare PPO

## 2021-07-18 ENCOUNTER — Ambulatory Visit: Payer: Medicare PPO

## 2021-07-18 ENCOUNTER — Inpatient Hospital Stay: Payer: Medicare PPO

## 2021-07-18 ENCOUNTER — Other Ambulatory Visit: Payer: Self-pay

## 2021-07-18 DIAGNOSIS — K909 Intestinal malabsorption, unspecified: Secondary | ICD-10-CM

## 2021-07-18 DIAGNOSIS — Z5111 Encounter for antineoplastic chemotherapy: Secondary | ICD-10-CM | POA: Diagnosis not present

## 2021-07-18 DIAGNOSIS — D500 Iron deficiency anemia secondary to blood loss (chronic): Secondary | ICD-10-CM

## 2021-07-18 DIAGNOSIS — R160 Hepatomegaly, not elsewhere classified: Secondary | ICD-10-CM

## 2021-07-18 DIAGNOSIS — C23 Malignant neoplasm of gallbladder: Secondary | ICD-10-CM

## 2021-07-18 LAB — CBC WITH DIFFERENTIAL/PLATELET
Abs Immature Granulocytes: 0.01 10*3/uL (ref 0.00–0.07)
Basophils Absolute: 0 10*3/uL (ref 0.0–0.1)
Basophils Relative: 0 %
Eosinophils Absolute: 0 10*3/uL (ref 0.0–0.5)
Eosinophils Relative: 0 %
HCT: 38.6 % (ref 36.0–46.0)
Hemoglobin: 12.3 g/dL (ref 12.0–15.0)
Immature Granulocytes: 0 %
Lymphocytes Relative: 9 %
Lymphs Abs: 0.4 10*3/uL — ABNORMAL LOW (ref 0.7–4.0)
MCH: 32 pg (ref 26.0–34.0)
MCHC: 31.9 g/dL (ref 30.0–36.0)
MCV: 100.5 fL — ABNORMAL HIGH (ref 80.0–100.0)
Monocytes Absolute: 0.7 10*3/uL (ref 0.1–1.0)
Monocytes Relative: 15 %
Neutro Abs: 3.5 10*3/uL (ref 1.7–7.7)
Neutrophils Relative %: 76 %
Platelets: 99 10*3/uL — ABNORMAL LOW (ref 150–400)
RBC: 3.84 MIL/uL — ABNORMAL LOW (ref 3.87–5.11)
RDW: 16.1 % — ABNORMAL HIGH (ref 11.5–15.5)
WBC: 4.6 10*3/uL (ref 4.0–10.5)
nRBC: 0 % (ref 0.0–0.2)

## 2021-07-18 LAB — COMPREHENSIVE METABOLIC PANEL
ALT: 9 U/L (ref 0–44)
AST: 20 U/L (ref 15–41)
Albumin: 3.6 g/dL (ref 3.5–5.0)
Alkaline Phosphatase: 144 U/L — ABNORMAL HIGH (ref 38–126)
Anion gap: 8 (ref 5–15)
BUN: 25 mg/dL — ABNORMAL HIGH (ref 8–23)
CO2: 31 mmol/L (ref 22–32)
Calcium: 9.4 mg/dL (ref 8.9–10.3)
Chloride: 96 mmol/L — ABNORMAL LOW (ref 98–111)
Creatinine, Ser: 1.3 mg/dL — ABNORMAL HIGH (ref 0.44–1.00)
GFR, Estimated: 42 mL/min — ABNORMAL LOW (ref >60)
Glucose, Bld: 182 mg/dL — ABNORMAL HIGH (ref 70–99)
Potassium: 3.1 mmol/L — ABNORMAL LOW (ref 3.5–5.1)
Sodium: 135 mmol/L (ref 135–145)
Total Bilirubin: 0.6 mg/dL (ref 0.3–1.2)
Total Protein: 6.3 g/dL — ABNORMAL LOW (ref 6.5–8.1)

## 2021-07-18 LAB — TSH: TSH: 0.837 u[IU]/mL (ref 0.308–3.960)

## 2021-07-18 MED ORDER — SODIUM CHLORIDE 0.9 % IV SOLN
1500.0000 mg | Freq: Once | INTRAVENOUS | Status: AC
  Administered 2021-07-18: 1500 mg via INTRAVENOUS
  Filled 2021-07-18: qty 30

## 2021-07-18 MED ORDER — LIDOCAINE-PRILOCAINE 2.5-2.5 % EX CREA: TOPICAL_CREAM | CUTANEOUS | 3 refills | Status: DC

## 2021-07-18 MED ORDER — SODIUM CHLORIDE 0.9 % IV SOLN
Freq: Once | INTRAVENOUS | Status: AC
Start: 2021-07-18 — End: 2021-07-18

## 2021-07-18 MED ORDER — LORAZEPAM 0.5 MG PO TABS: 0.5000 mg | ORAL_TABLET | Freq: Four times a day (QID) | ORAL | 0 refills | Status: DC | PRN

## 2021-07-18 MED ORDER — SODIUM CHLORIDE 0.9 % IV SOLN
300.0000 mg | Freq: Once | INTRAVENOUS | Status: AC
  Administered 2021-07-18: 300 mg via INTRAVENOUS
  Filled 2021-07-18: qty 10

## 2021-07-18 MED ORDER — DEXAMETHASONE 4 MG PO TABS: 8.0000 mg | ORAL_TABLET | Freq: Every day | ORAL | 1 refills | Status: DC

## 2021-07-18 MED ORDER — OXALIPLATIN CHEMO INJECTION 100 MG/20ML
68.0000 mg/m2 | Freq: Once | INTRAVENOUS | Status: AC
  Administered 2021-07-18: 115 mg via INTRAVENOUS
  Filled 2021-07-18: qty 20

## 2021-07-18 MED ORDER — ONDANSETRON HCL 8 MG PO TABS: 8.0000 mg | ORAL_TABLET | Freq: Two times a day (BID) | ORAL | 1 refills | Status: DC | PRN

## 2021-07-18 MED ORDER — SODIUM CHLORIDE 0.9 % IV SOLN
1920.0000 mg/m2 | INTRAVENOUS | Status: DC
  Administered 2021-07-18: 3300 mg via INTRAVENOUS
  Filled 2021-07-18: qty 66

## 2021-07-18 MED ORDER — LEUCOVORIN CALCIUM INJECTION 350 MG
400.0000 mg/m2 | Freq: Once | INTRAVENOUS | Status: AC
  Administered 2021-07-18: 684 mg via INTRAVENOUS
  Filled 2021-07-18: qty 34.2

## 2021-07-18 MED ORDER — PALONOSETRON HCL INJECTION 0.25 MG/5ML
0.2500 mg | Freq: Once | INTRAVENOUS | Status: AC
  Administered 2021-07-18: 0.25 mg via INTRAVENOUS
  Filled 2021-07-18: qty 5

## 2021-07-18 MED ORDER — SODIUM CHLORIDE 0.9 % IV SOLN
10.0000 mg | Freq: Once | INTRAVENOUS | Status: AC
  Administered 2021-07-18: 10 mg via INTRAVENOUS
  Filled 2021-07-18: qty 10

## 2021-07-18 MED ORDER — DEXTROSE 5 % IV SOLN: Freq: Once | INTRAVENOUS | Status: AC

## 2021-07-18 MED ORDER — SODIUM CHLORIDE 0.9 % IV SOLN: Freq: Once | INTRAVENOUS | Status: AC

## 2021-07-18 MED ORDER — PROCHLORPERAZINE MALEATE 10 MG PO TABS: 10.0000 mg | ORAL_TABLET | Freq: Four times a day (QID) | ORAL | 1 refills | Status: DC | PRN

## 2021-07-18 MED ORDER — FLUOROURACIL CHEMO INJECTION 2.5 GM/50ML
320.0000 mg/m2 | Freq: Once | INTRAVENOUS | Status: AC
  Administered 2021-07-18: 550 mg via INTRAVENOUS
  Filled 2021-07-18: qty 11

## 2021-07-18 NOTE — Patient Instructions (Signed)

## 2021-07-19 ENCOUNTER — Other Ambulatory Visit: Payer: Medicare PPO

## 2021-07-19 ENCOUNTER — Ambulatory Visit: Payer: Medicare PPO | Admitting: Hematology & Oncology

## 2021-07-19 ENCOUNTER — Ambulatory Visit: Payer: Medicare PPO

## 2021-07-19 LAB — T4: T4, Total: 8.1 ug/dL (ref 4.5–12.0)

## 2021-07-20 ENCOUNTER — Other Ambulatory Visit: Payer: Self-pay

## 2021-07-20 ENCOUNTER — Inpatient Hospital Stay: Payer: Medicare PPO

## 2021-07-20 VITALS — BP 181/80 | HR 66 | Temp 98.2°F | Resp 19

## 2021-07-20 DIAGNOSIS — Z5111 Encounter for antineoplastic chemotherapy: Secondary | ICD-10-CM | POA: Diagnosis not present

## 2021-07-20 DIAGNOSIS — R160 Hepatomegaly, not elsewhere classified: Secondary | ICD-10-CM

## 2021-07-20 MED ORDER — HEPARIN SOD (PORK) LOCK FLUSH 100 UNIT/ML IV SOLN
500.0000 [IU] | Freq: Once | INTRAVENOUS | Status: AC | PRN
  Administered 2021-07-20: 500 [IU]

## 2021-07-20 MED ORDER — SODIUM CHLORIDE 0.9% FLUSH
10.0000 mL | INTRAVENOUS | Status: DC | PRN
  Administered 2021-07-20: 10 mL

## 2021-07-20 NOTE — Patient Instructions (Signed)

## 2021-07-25 ENCOUNTER — Other Ambulatory Visit: Payer: Medicare PPO

## 2021-07-25 ENCOUNTER — Ambulatory Visit: Payer: Medicare PPO | Admitting: Hematology & Oncology

## 2021-07-25 ENCOUNTER — Ambulatory Visit: Payer: Medicare PPO

## 2021-07-25 ENCOUNTER — Encounter: Payer: Self-pay | Admitting: *Deleted

## 2021-07-25 NOTE — Progress Notes (Signed)
Due to delays in insurance authorization, start of new regimen delayed, and scheduled follow up now 08/08/2021.  Oncology Nurse Navigator Documentation  Oncology Nurse Navigator Flowsheets 07/25/2021  Abnormal Finding Date -  Planned Course of Treatment -  Phase of Treatment -  Chemotherapy Actual Start Date: -  Navigator Follow Up Date: 08/08/2021  Navigator Follow Up Reason: Follow-up Appointment;Chemotherapy  Navigator Location [facility]  Referral Date to RadOnc/MedOnc -  Navigator Encounter Type Appt/Treatment Plan Review  Telephone -  Treatment Initiated Date -  Patient Visit Type MedOnc  Treatment Phase Active Tx  Barriers/Navigation Needs Coordination of Care;Education  Education -  Interventions None Required  Acuity Level 2-Minimal Needs (1-2 Barriers Identified)  Referrals -  Coordination of Care -  Education Method -  Support Groups/Services Friends and Family  Time Spent with Patient 15

## 2021-07-26 ENCOUNTER — Other Ambulatory Visit: Payer: Self-pay | Admitting: *Deleted

## 2021-07-26 MED ORDER — DRONABINOL 2.5 MG PO CAPS: 2.5000 mg | ORAL_CAPSULE | Freq: Two times a day (BID) | ORAL | 0 refills | Status: DC

## 2021-08-04 ENCOUNTER — Inpatient Hospital Stay: Payer: Medicare PPO | Attending: Hematology & Oncology

## 2021-08-04 ENCOUNTER — Other Ambulatory Visit: Payer: Self-pay

## 2021-08-04 VITALS — BP 153/86 | HR 68 | Temp 98.1°F | Resp 19

## 2021-08-04 DIAGNOSIS — C23 Malignant neoplasm of gallbladder: Secondary | ICD-10-CM | POA: Insufficient documentation

## 2021-08-04 DIAGNOSIS — Z79899 Other long term (current) drug therapy: Secondary | ICD-10-CM | POA: Insufficient documentation

## 2021-08-04 DIAGNOSIS — C779 Secondary and unspecified malignant neoplasm of lymph node, unspecified: Secondary | ICD-10-CM | POA: Insufficient documentation

## 2021-08-04 DIAGNOSIS — N189 Chronic kidney disease, unspecified: Secondary | ICD-10-CM | POA: Diagnosis not present

## 2021-08-04 DIAGNOSIS — D500 Iron deficiency anemia secondary to blood loss (chronic): Secondary | ICD-10-CM | POA: Diagnosis not present

## 2021-08-04 DIAGNOSIS — D631 Anemia in chronic kidney disease: Secondary | ICD-10-CM | POA: Diagnosis not present

## 2021-08-04 DIAGNOSIS — K909 Intestinal malabsorption, unspecified: Secondary | ICD-10-CM

## 2021-08-04 DIAGNOSIS — M7989 Other specified soft tissue disorders: Secondary | ICD-10-CM | POA: Diagnosis not present

## 2021-08-04 DIAGNOSIS — Z5111 Encounter for antineoplastic chemotherapy: Secondary | ICD-10-CM | POA: Diagnosis not present

## 2021-08-04 DIAGNOSIS — Z5112 Encounter for antineoplastic immunotherapy: Secondary | ICD-10-CM | POA: Diagnosis present

## 2021-08-04 DIAGNOSIS — R200 Anesthesia of skin: Secondary | ICD-10-CM | POA: Insufficient documentation

## 2021-08-04 DIAGNOSIS — C787 Secondary malignant neoplasm of liver and intrahepatic bile duct: Secondary | ICD-10-CM | POA: Diagnosis not present

## 2021-08-04 DIAGNOSIS — R42 Dizziness and giddiness: Secondary | ICD-10-CM | POA: Insufficient documentation

## 2021-08-04 MED ORDER — SODIUM CHLORIDE 0.9% FLUSH
10.0000 mL | Freq: Once | INTRAVENOUS | Status: AC
  Administered 2021-08-04: 10 mL via INTRAVENOUS

## 2021-08-04 MED ORDER — SODIUM CHLORIDE 0.9 % IV SOLN: Freq: Once | INTRAVENOUS | Status: AC

## 2021-08-04 MED ORDER — HEPARIN SOD (PORK) LOCK FLUSH 100 UNIT/ML IV SOLN
250.0000 [IU] | Freq: Once | INTRAVENOUS | Status: AC | PRN
  Administered 2021-08-04: 500 [IU]

## 2021-08-04 MED ORDER — SODIUM CHLORIDE 0.9 % IV SOLN
300.0000 mg | Freq: Once | INTRAVENOUS | Status: AC
  Administered 2021-08-04: 300 mg via INTRAVENOUS
  Filled 2021-08-04: qty 10

## 2021-08-04 NOTE — Patient Instructions (Signed)

## 2021-08-08 ENCOUNTER — Encounter: Payer: Self-pay | Admitting: Family

## 2021-08-08 ENCOUNTER — Inpatient Hospital Stay: Payer: Medicare PPO

## 2021-08-08 ENCOUNTER — Other Ambulatory Visit: Payer: Self-pay

## 2021-08-08 ENCOUNTER — Inpatient Hospital Stay (HOSPITAL_BASED_OUTPATIENT_CLINIC_OR_DEPARTMENT_OTHER): Payer: Medicare PPO | Admitting: Family

## 2021-08-08 VITALS — BP 147/81 | HR 86 | Resp 16 | Ht 61.0 in | Wt 150.8 lb

## 2021-08-08 DIAGNOSIS — C23 Malignant neoplasm of gallbladder: Secondary | ICD-10-CM

## 2021-08-08 DIAGNOSIS — Z5111 Encounter for antineoplastic chemotherapy: Secondary | ICD-10-CM | POA: Diagnosis not present

## 2021-08-08 DIAGNOSIS — K909 Intestinal malabsorption, unspecified: Secondary | ICD-10-CM

## 2021-08-08 DIAGNOSIS — E032 Hypothyroidism due to medicaments and other exogenous substances: Secondary | ICD-10-CM

## 2021-08-08 DIAGNOSIS — D500 Iron deficiency anemia secondary to blood loss (chronic): Secondary | ICD-10-CM

## 2021-08-08 LAB — TSH: TSH: 0.973 u[IU]/mL (ref 0.308–3.960)

## 2021-08-08 LAB — CMP (CANCER CENTER ONLY)
ALT: 12 U/L (ref 0–44)
AST: 22 U/L (ref 15–41)
Albumin: 3.6 g/dL (ref 3.5–5.0)
Alkaline Phosphatase: 155 U/L — ABNORMAL HIGH (ref 38–126)
Anion gap: 6 (ref 5–15)
BUN: 24 mg/dL — ABNORMAL HIGH (ref 8–23)
CO2: 31 mmol/L (ref 22–32)
Calcium: 9.4 mg/dL (ref 8.9–10.3)
Chloride: 99 mmol/L (ref 98–111)
Creatinine: 1.3 mg/dL — ABNORMAL HIGH (ref 0.44–1.00)
GFR, Estimated: 42 mL/min — ABNORMAL LOW (ref >60)
Glucose, Bld: 162 mg/dL — ABNORMAL HIGH (ref 70–99)
Potassium: 3.3 mmol/L — ABNORMAL LOW (ref 3.5–5.1)
Sodium: 136 mmol/L (ref 135–145)
Total Bilirubin: 0.4 mg/dL (ref 0.3–1.2)
Total Protein: 5.8 g/dL — ABNORMAL LOW (ref 6.5–8.1)

## 2021-08-08 LAB — CBC WITH DIFFERENTIAL (CANCER CENTER ONLY)
Abs Immature Granulocytes: 0.01 10*3/uL (ref 0.00–0.07)
Basophils Absolute: 0 10*3/uL (ref 0.0–0.1)
Basophils Relative: 1 %
Eosinophils Absolute: 0 10*3/uL (ref 0.0–0.5)
Eosinophils Relative: 1 %
HCT: 39.2 % (ref 36.0–46.0)
Hemoglobin: 12.4 g/dL (ref 12.0–15.0)
Immature Granulocytes: 1 %
Lymphocytes Relative: 23 %
Lymphs Abs: 0.4 10*3/uL — ABNORMAL LOW (ref 0.7–4.0)
MCH: 31.8 pg (ref 26.0–34.0)
MCHC: 31.6 g/dL (ref 30.0–36.0)
MCV: 100.5 fL — ABNORMAL HIGH (ref 80.0–100.0)
Monocytes Absolute: 0.4 10*3/uL (ref 0.1–1.0)
Monocytes Relative: 20 %
Neutro Abs: 1.1 10*3/uL — ABNORMAL LOW (ref 1.7–7.7)
Neutrophils Relative %: 54 %
Platelet Count: 87 10*3/uL — ABNORMAL LOW (ref 150–400)
RBC: 3.9 MIL/uL (ref 3.87–5.11)
RDW: 16.7 % — ABNORMAL HIGH (ref 11.5–15.5)
WBC Count: 1.9 10*3/uL — ABNORMAL LOW (ref 4.0–10.5)
nRBC: 0 % (ref 0.0–0.2)

## 2021-08-08 LAB — RETICULOCYTES
Immature Retic Fract: 22.6 % — ABNORMAL HIGH (ref 2.3–15.9)
RBC.: 3.85 MIL/uL — ABNORMAL LOW (ref 3.87–5.11)
Retic Count, Absolute: 83.9 10*3/uL (ref 19.0–186.0)
Retic Ct Pct: 2.2 % (ref 0.4–3.1)

## 2021-08-08 LAB — IRON AND TIBC
Iron: 33 ug/dL — ABNORMAL LOW (ref 41–142)
Saturation Ratios: 15 % — ABNORMAL LOW (ref 21–57)
TIBC: 220 ug/dL — ABNORMAL LOW (ref 236–444)
UIBC: 187 ug/dL (ref 120–384)

## 2021-08-08 LAB — FERRITIN: Ferritin: 1363 ng/mL — ABNORMAL HIGH (ref 11–307)

## 2021-08-08 MED ORDER — SODIUM CHLORIDE 0.9 % IV SOLN: Freq: Once | INTRAVENOUS | Status: AC

## 2021-08-08 MED ORDER — SODIUM CHLORIDE 0.9 % IV SOLN
300.0000 mg | Freq: Once | INTRAVENOUS | Status: AC
  Administered 2021-08-08: 300 mg via INTRAVENOUS
  Filled 2021-08-08: qty 300

## 2021-08-08 NOTE — Patient Instructions (Signed)

## 2021-08-08 NOTE — Patient Instructions (Signed)

## 2021-08-08 NOTE — Progress Notes (Signed)
Hematology and Oncology Follow Up Visit  Vanessa Garrett [id number] 14-Nov-1940 80 y.o. 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   Interim History:  Vanessa Garrett is here today with her son for follow-up and treatment. She states that she had fatigue and dizziness after her first cycle of this current treatment.  Her WBC count is down at 1.9, ANC 1.1, platelets 87 and Hgb 12.4.  No fever, chills, vomiting, cough, rash, chest pain, palpitations, abdominal pain or changes in bowel or bladder habits.  No blood loss noted. No bruising or petechiae.  She has positional numbness and tingling in her right hand. This comes and goes.  She has chronic swelling in her lower extremities which is described as stable on Lasix.  No falls or syncope to report.  She states that her appetite is good. She states that she has random nausea with out vomiting. She is staying well hydrated throughout the day. Her weight is stable at 150 lbs.   ECOG Performance Status: 1 - Symptomatic but completely ambulatory  Medications:  Allergies as of 08/08/2021       Reactions   Megace Es [megestrol Acetate] Swelling   Face swelling        Medication List        Accurate as of August 08, 2021  8:32 AM. If you have any questions, ask your nurse or doctor.          dexamethasone 4 MG tablet Commonly known as: DECADRON Take 2 tablets (8 mg total) by mouth daily. Start the day after chemotherapy for 2 days. Take with food.   dronabinol 2.5 MG capsule Commonly  known as: MARINOL Take 1 capsule (2.5 mg total) by mouth 2 (two) times daily before a meal.   furosemide 40 MG tablet Commonly known as: LASIX Take 40 mg by mouth daily.   Generlac 10 GM/15ML Soln Generic drug: lactulose (encephalopathy) Take 10 g by mouth daily as needed (constipation).   lidocaine-prilocaine cream Commonly known as: EMLA Apply to affected area once   lidocaine-prilocaine cream Commonly known as: EMLA Apply to affected area once   LORazepam 0.5 MG tablet Commonly known as: Ativan Take 1 tablet (0.5 mg total) by mouth every 6 (six) hours as needed (Nausea or vomiting).   metoprolol tartrate 25 MG tablet Commonly known as: LOPRESSOR TAKE 1 TABLET(25 MG) BY MOUTH TWICE DAILY   NON FORMULARY Take 2 tablets by mouth daily. Mega Food - Blood Builder   NON FORMULARY 1.25 mg every other day. Beet Root Powder   ondansetron 8 MG tablet Commonly known as: Zofran Take 1 tablet (8 mg total) by mouth 2 (two) times daily as needed for refractory nausea / vomiting. Start on day 3 after chemotherapy.   potassium chloride SA 20 MEQ tablet Commonly known as: KLOR-CON M Take 1 tablet (20 mEq total) by mouth daily.   prochlorperazine 10 MG tablet Commonly known as: COMPAZINE Take 1 tablet (10 mg total) by mouth every 6 (six) hours as needed (Nausea or vomiting).  Retacrit 10000 UNIT/ML injection Generic drug: epoetin alfa-epbx INJECT 1 ML (10,000 UNITS TOTAL) SUBCUTANEOUSLY DAILY.   S.S.S. TONIC PO Take 45 mLs by mouth every other day.   triamterene-hydrochlorothiazide 75-50 MG tablet Commonly known as: MAXZIDE Take 1 tablet by mouth daily.   vitamin B-12 100 MCG tablet Commonly known as: CYANOCOBALAMIN Take 100 mcg by mouth in the morning and at bedtime.   vitamin D3 50 MCG (2000 UT) Caps Take by mouth daily.        Allergies:  Allergies  Allergen Reactions   Megace Es [Megestrol Acetate] Swelling    Face swelling    Past Medical History,  Surgical history, Social history, and Family History were reviewed and updated.  Review of Systems: All other 10 point review of systems is negative.   Physical Exam:  vitals were not taken for this visit.   Wt Readings from Last 3 Encounters:  06/28/21 150 lb 8 oz (68.3 kg)  06/17/21 150 lb (68 kg)  05/27/21 150 lb (68 kg)    Ocular: Sclerae unicteric, pupils equal, round and reactive to light Ear-nose-throat: Oropharynx clear, dentition fair Lymphatic: No cervical or supraclavicular adenopathy Lungs no rales or rhonchi, good excursion bilaterally Heart regular rate and rhythm, no murmur appreciated Abd soft, nontender, positive bowel sounds MSK no focal spinal tenderness, no joint edema Neuro: non-focal, well-oriented, appropriate affect Breasts: Deferred   Lab Results  Component Value Date   WBC 4.6 07/18/2021   HGB 12.3 07/18/2021   HCT 38.6 07/18/2021   MCV 100.5 (H) 07/18/2021   PLT 99 (L) 07/18/2021   Lab Results  Component Value Date   FERRITIN 862 (H) 06/28/2021   IRON 34 (L) 06/28/2021   TIBC 250 06/28/2021   UIBC 216 06/28/2021   IRONPCTSAT 14 (L) 06/28/2021   Lab Results  Component Value Date   RETICCTPCT 2.5 06/28/2021   RBC 3.84 (L) 07/18/2021   No results found for: KPAFRELGTCHN, LAMBDASER, KAPLAMBRATIO No results found for: IGGSERUM, IGA, IGMSERUM No results found for: TOTALPROTELP, ALBUMINELP, A1GS, A2GS, BETS, BETA2SER, GAMS, MSPIKE, SPEI   Chemistry      Component Value Date/Time   NA 135 07/18/2021 0800   K 3.1 (L) 07/18/2021 0800   CL 96 (L) 07/18/2021 0800   CO2 31 07/18/2021 0800   BUN 25 (H) 07/18/2021 0800   CREATININE 1.30 (H) 07/18/2021 0800   CREATININE 1.38 (H) 06/28/2021 1204      Component Value Date/Time   CALCIUM 9.4 07/18/2021 0800   ALKPHOS 144 (H) 07/18/2021 0800   AST 20 07/18/2021 0800   AST 22 06/28/2021 1204   ALT 9 07/18/2021 0800   ALT 11 06/28/2021 1204   BILITOT 0.6 07/18/2021 0800   BILITOT 0.4 06/28/2021  1204       Impression and Plan: Vanessa Garrett is a very pleasant 80 yo African American female with stage IV adenocarcinoma of the gallbladder.  I reviewed lab work and patients current symptoms with Vanessa Garrett and we will hold treatment for 1 week.  CA 19-9 last visit was 280. Today's is pending.  She will get IV iron today as planned.  Follow-up in 1 week.   Lottie Dawson, NP 12/12/20228:32 AM

## 2021-08-09 ENCOUNTER — Other Ambulatory Visit: Payer: Self-pay | Admitting: *Deleted

## 2021-08-09 ENCOUNTER — Encounter: Payer: Self-pay | Admitting: *Deleted

## 2021-08-09 DIAGNOSIS — C23 Malignant neoplasm of gallbladder: Secondary | ICD-10-CM

## 2021-08-09 LAB — T4: T4, Total: 9.4 ug/dL (ref 4.5–12.0)

## 2021-08-09 MED ORDER — POTASSIUM CHLORIDE CRYS ER 20 MEQ PO TBCR: 20.0000 meq | EXTENDED_RELEASE_TABLET | Freq: Every day | ORAL | 2 refills | Status: DC

## 2021-08-09 NOTE — Progress Notes (Signed)
Patient unable to be treated. Will re-attempt next week.   Oncology Nurse Navigator Documentation  Oncology Nurse Navigator Flowsheets 08/09/2021  Abnormal Finding Date -  Planned Course of Treatment -  Phase of Treatment -  Chemotherapy Actual Start Date: -  Navigator Follow Up Date: 08/15/2021  Navigator Follow Up Reason: Follow-up Appointment  Navigator Location [facility]  Referral Date to RadOnc/MedOnc -  Navigator Encounter Type Appt/Treatment Plan Review  Telephone -  Treatment Initiated Date -  Patient Visit Type MedOnc  Treatment Phase Active Tx  Barriers/Navigation Needs Coordination of Care;Education  Education -  Interventions None Required  Acuity Level 2-Minimal Needs (1-2 Barriers Identified)  Referrals -  Coordination of Care -  Education Method -  Support Groups/Services Friends and Family  Time Spent with Patient 15

## 2021-08-10 ENCOUNTER — Inpatient Hospital Stay: Payer: Medicare PPO

## 2021-08-11 ENCOUNTER — Encounter: Payer: Self-pay | Admitting: Hematology & Oncology

## 2021-08-11 ENCOUNTER — Inpatient Hospital Stay: Payer: Medicare PPO

## 2021-08-11 ENCOUNTER — Other Ambulatory Visit: Payer: Self-pay

## 2021-08-11 VITALS — BP 162/77 | HR 69 | Temp 98.3°F | Resp 18

## 2021-08-11 DIAGNOSIS — Z5111 Encounter for antineoplastic chemotherapy: Secondary | ICD-10-CM | POA: Diagnosis not present

## 2021-08-11 DIAGNOSIS — K909 Intestinal malabsorption, unspecified: Secondary | ICD-10-CM

## 2021-08-11 DIAGNOSIS — D500 Iron deficiency anemia secondary to blood loss (chronic): Secondary | ICD-10-CM

## 2021-08-11 LAB — CANCER ANTIGEN 19-9: CA 19-9: 574 U/mL — ABNORMAL HIGH (ref 0–35)

## 2021-08-11 MED ORDER — SODIUM CHLORIDE 0.9 % IV SOLN: Freq: Once | INTRAVENOUS | Status: AC

## 2021-08-11 MED ORDER — SODIUM CHLORIDE 0.9% FLUSH
10.0000 mL | Freq: Once | INTRAVENOUS | Status: AC
  Administered 2021-08-11: 10 mL via INTRAVENOUS

## 2021-08-11 MED ORDER — SODIUM CHLORIDE 0.9 % IV SOLN
300.0000 mg | Freq: Once | INTRAVENOUS | Status: AC
  Administered 2021-08-11: 300 mg via INTRAVENOUS
  Filled 2021-08-11: qty 300

## 2021-08-11 MED ORDER — HEPARIN SOD (PORK) LOCK FLUSH 100 UNIT/ML IV SOLN
500.0000 [IU] | Freq: Once | INTRAVENOUS | Status: AC
  Administered 2021-08-11: 500 [IU] via INTRAVENOUS

## 2021-08-11 NOTE — Patient Instructions (Signed)

## 2021-08-15 ENCOUNTER — Encounter: Payer: Self-pay | Admitting: Family

## 2021-08-15 ENCOUNTER — Encounter: Payer: Self-pay | Admitting: *Deleted

## 2021-08-15 ENCOUNTER — Inpatient Hospital Stay: Payer: Medicare PPO

## 2021-08-15 ENCOUNTER — Other Ambulatory Visit: Payer: Self-pay

## 2021-08-15 ENCOUNTER — Inpatient Hospital Stay (HOSPITAL_BASED_OUTPATIENT_CLINIC_OR_DEPARTMENT_OTHER): Payer: Medicare PPO | Admitting: Family

## 2021-08-15 VITALS — BP 167/79 | HR 61 | Temp 98.0°F | Resp 18 | Wt 150.4 lb

## 2021-08-15 DIAGNOSIS — D500 Iron deficiency anemia secondary to blood loss (chronic): Secondary | ICD-10-CM | POA: Diagnosis not present

## 2021-08-15 DIAGNOSIS — C23 Malignant neoplasm of gallbladder: Secondary | ICD-10-CM

## 2021-08-15 DIAGNOSIS — E032 Hypothyroidism due to medicaments and other exogenous substances: Secondary | ICD-10-CM

## 2021-08-15 DIAGNOSIS — Z5111 Encounter for antineoplastic chemotherapy: Secondary | ICD-10-CM | POA: Diagnosis not present

## 2021-08-15 DIAGNOSIS — R160 Hepatomegaly, not elsewhere classified: Secondary | ICD-10-CM

## 2021-08-15 LAB — CMP (CANCER CENTER ONLY)
ALT: 12 U/L (ref 0–44)
AST: 21 U/L (ref 15–41)
Albumin: 3.4 g/dL — ABNORMAL LOW (ref 3.5–5.0)
Alkaline Phosphatase: 148 U/L — ABNORMAL HIGH (ref 38–126)
Anion gap: 6 (ref 5–15)
BUN: 25 mg/dL — ABNORMAL HIGH (ref 8–23)
CO2: 32 mmol/L (ref 22–32)
Calcium: 8.8 mg/dL — ABNORMAL LOW (ref 8.9–10.3)
Chloride: 99 mmol/L (ref 98–111)
Creatinine: 1.26 mg/dL — ABNORMAL HIGH (ref 0.44–1.00)
GFR, Estimated: 43 mL/min — ABNORMAL LOW (ref >60)
Glucose, Bld: 158 mg/dL — ABNORMAL HIGH (ref 70–99)
Potassium: 3.2 mmol/L — ABNORMAL LOW (ref 3.5–5.1)
Sodium: 137 mmol/L (ref 135–145)
Total Bilirubin: 0.5 mg/dL (ref 0.3–1.2)
Total Protein: 6 g/dL — ABNORMAL LOW (ref 6.5–8.1)

## 2021-08-15 LAB — CBC WITH DIFFERENTIAL (CANCER CENTER ONLY)
Abs Immature Granulocytes: 0.01 10*3/uL (ref 0.00–0.07)
Basophils Absolute: 0 10*3/uL (ref 0.0–0.1)
Basophils Relative: 0 %
Eosinophils Absolute: 0 10*3/uL (ref 0.0–0.5)
Eosinophils Relative: 0 %
HCT: 40.7 % (ref 36.0–46.0)
Hemoglobin: 12.9 g/dL (ref 12.0–15.0)
Immature Granulocytes: 0 %
Lymphocytes Relative: 19 %
Lymphs Abs: 0.6 10*3/uL — ABNORMAL LOW (ref 0.7–4.0)
MCH: 32.4 pg (ref 26.0–34.0)
MCHC: 31.7 g/dL (ref 30.0–36.0)
MCV: 102.3 fL — ABNORMAL HIGH (ref 80.0–100.0)
Monocytes Absolute: 0.5 10*3/uL (ref 0.1–1.0)
Monocytes Relative: 16 %
Neutro Abs: 1.9 10*3/uL (ref 1.7–7.7)
Neutrophils Relative %: 65 %
Platelet Count: 101 10*3/uL — ABNORMAL LOW (ref 150–400)
RBC: 3.98 MIL/uL (ref 3.87–5.11)
RDW: 17.2 % — ABNORMAL HIGH (ref 11.5–15.5)
WBC Count: 3 10*3/uL — ABNORMAL LOW (ref 4.0–10.5)
nRBC: 0 % (ref 0.0–0.2)

## 2021-08-15 LAB — LACTATE DEHYDROGENASE: LDH: 306 U/L — ABNORMAL HIGH (ref 98–192)

## 2021-08-15 MED ORDER — SODIUM CHLORIDE 0.9 % IV SOLN
10.0000 mg | Freq: Once | INTRAVENOUS | Status: AC
  Administered 2021-08-15: 10:00:00 10 mg via INTRAVENOUS
  Filled 2021-08-15: qty 10

## 2021-08-15 MED ORDER — SODIUM CHLORIDE 0.9 % IV SOLN
1500.0000 mg | Freq: Once | INTRAVENOUS | Status: AC
  Administered 2021-08-15: 1500 mg via INTRAVENOUS
  Filled 2021-08-15: qty 30

## 2021-08-15 MED ORDER — DEXTROSE 5 % IV SOLN: Freq: Once | INTRAVENOUS | Status: AC

## 2021-08-15 MED ORDER — OXALIPLATIN CHEMO INJECTION 100 MG/20ML
68.0000 mg/m2 | Freq: Once | INTRAVENOUS | Status: AC
  Administered 2021-08-15: 12:00:00 115 mg via INTRAVENOUS
  Filled 2021-08-15: qty 20

## 2021-08-15 MED ORDER — FLUOROURACIL CHEMO INJECTION 2.5 GM/50ML
320.0000 mg/m2 | Freq: Once | INTRAVENOUS | Status: AC
  Administered 2021-08-15: 14:00:00 550 mg via INTRAVENOUS
  Filled 2021-08-15: qty 11

## 2021-08-15 MED ORDER — PALONOSETRON HCL INJECTION 0.25 MG/5ML
0.2500 mg | Freq: Once | INTRAVENOUS | Status: AC
  Administered 2021-08-15: 09:00:00 0.25 mg via INTRAVENOUS
  Filled 2021-08-15: qty 5

## 2021-08-15 MED ORDER — LEUCOVORIN CALCIUM INJECTION 350 MG
400.0000 mg/m2 | Freq: Once | INTRAVENOUS | Status: AC
  Administered 2021-08-15: 12:00:00 684 mg via INTRAVENOUS
  Filled 2021-08-15: qty 34.2

## 2021-08-15 MED ORDER — SODIUM CHLORIDE 0.9 % IV SOLN: Freq: Once | INTRAVENOUS | Status: AC

## 2021-08-15 MED ORDER — SODIUM CHLORIDE 0.9 % IV SOLN
1920.0000 mg/m2 | INTRAVENOUS | Status: DC
  Administered 2021-08-15: 14:00:00 3300 mg via INTRAVENOUS
  Filled 2021-08-15: qty 66

## 2021-08-15 NOTE — Progress Notes (Signed)
Hematology and Oncology Follow Up Visit  Vanessa Garrett [id number] 08-28-1941 80 y.o. 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  Her WBC count is now 3.0, ANC 1.9, Hgb 12.9, MCV 102 and platelets 101.  CA 19-9 last week was 574.  No fever, chills, n/v, cough, rash, dizziness, SOB, chest pain, palpitations, abdominal pain or changes in bowel or bladder habits.  No blood loss noted. No abnormal bruising, no petechiae.  Chronic swelling in both lower extremities is unchanged from baseline. Pedal pulses are 2+.  No tenderness, numbness or tingling in her extremities.  No falls or syncope to report.  She does not have much of an appetite but is supplementing with 2 high protein boost a day. She admits that she needs to hydrate better throughout the day.  Her weight is stable at 150 lbs.   ECOG Performance Status: 1 - Symptomatic but completely ambulatory  Medications:  Allergies as of 08/15/2021       Reactions   Megace Es [megestrol Acetate] Swelling   Facial swelling        Medication List        Accurate as of August 15, 2021  8:55 AM. If you have any questions, ask your nurse or doctor.          dexamethasone 4 MG tablet Commonly known as: DECADRON Take 2 tablets (8 mg total) by mouth daily. Start the day after chemotherapy for 2 days. Take with food.   dronabinol 2.5 MG capsule Commonly known as: MARINOL Take 1 capsule (2.5 mg  total) by mouth 2 (two) times daily before a meal.   furosemide 40 MG tablet Commonly known as: LASIX Take 40 mg by mouth daily.   Generlac 10 GM/15ML Soln Generic drug: lactulose (encephalopathy) Take 10 g by mouth daily as needed (constipation).   lidocaine-prilocaine cream Commonly known as: EMLA Apply to affected area once   lidocaine-prilocaine cream Commonly known as: EMLA Apply to affected area once   LORazepam 0.5 MG tablet Commonly known as: Ativan Take 1 tablet (0.5 mg total) by mouth every 6 (six) hours as needed (Nausea or vomiting).   metoprolol tartrate 25 MG tablet Commonly known as: LOPRESSOR TAKE 1 TABLET(25 MG) BY MOUTH TWICE DAILY   NON FORMULARY Take 2 tablets by mouth daily. Mega Food - Blood Builder   NON FORMULARY 1.25 mg every other day. Beet Root Powder   ondansetron 8 MG tablet Commonly known as: Zofran Take 1 tablet (8 mg total) by mouth 2 (two) times daily as needed for refractory nausea / vomiting. Start on day 3 after chemotherapy.   potassium chloride SA 20 MEQ tablet Commonly known as: KLOR-CON M Take 1 tablet (20 mEq total) by mouth daily.   prochlorperazine 10 MG tablet Commonly known as: COMPAZINE Take 1 tablet (10 mg total) by mouth every 6 (six) hours as needed (Nausea or vomiting).   Retacrit 10000 UNIT/ML injection  Generic drug: epoetin alfa-epbx INJECT 1 ML (10,000 UNITS TOTAL) SUBCUTANEOUSLY DAILY.   S.S.S. TONIC PO Take 45 mLs by mouth every other day.   triamterene-hydrochlorothiazide 75-50 MG tablet Commonly known as: MAXZIDE Take 1 tablet by mouth daily.   vitamin B-12 100 MCG tablet Commonly known as: CYANOCOBALAMIN Take 100 mcg by mouth in the morning and at bedtime.   vitamin D3 50 MCG (2000 UT) Caps Take by mouth daily.        Allergies:  Allergies  Allergen Reactions   Megace Es [Megestrol Acetate] Swelling    Facial swelling    Past Medical History, Surgical history, Social history, and Family  History were reviewed and updated.  Review of Systems: All other 10 point review of systems is negative.   Physical Exam:  weight is 150 lb 6.4 oz (68.2 kg). Her oral temperature is 98 F (36.7 C). Her blood pressure is 167/79 (abnormal) and her pulse is 61. Her respiration is 18 and oxygen saturation is 100%.   Wt Readings from Last 3 Encounters:  08/15/21 150 lb 6.4 oz (68.2 kg)  08/08/21 150 lb 12.8 oz (68.4 kg)  06/28/21 150 lb 8 oz (68.3 kg)    Ocular: Sclerae unicteric, pupils equal, round and reactive to light Ear-nose-throat: Oropharynx clear, dentition fair Lymphatic: No cervical or supraclavicular adenopathy Lungs no rales or rhonchi, good excursion bilaterally Heart regular rate and rhythm, no murmur appreciated Abd soft, nontender, positive bowel sounds MSK no focal spinal tenderness, no joint edema Neuro: non-focal, well-oriented, appropriate affect Breasts: Deferred   Lab Results  Component Value Date   WBC 3.0 (L) 08/15/2021   HGB 12.9 08/15/2021   HCT 40.7 08/15/2021   MCV 102.3 (H) 08/15/2021   PLT 101 (L) 08/15/2021   Lab Results  Component Value Date   FERRITIN 1,363 (H) 08/08/2021   IRON 33 (L) 08/08/2021   TIBC 220 (L) 08/08/2021   UIBC 187 08/08/2021   IRONPCTSAT 15 (L) 08/08/2021   Lab Results  Component Value Date   RETICCTPCT 2.2 08/08/2021   RBC 3.98 08/15/2021   No results found for: KPAFRELGTCHN, LAMBDASER, KAPLAMBRATIO No results found for: IGGSERUM, IGA, IGMSERUM No results found for: Ronnald Ramp, A1GS, A2GS, Tillman Sers, SPEI   Chemistry      Component Value Date/Time   NA 137 08/15/2021 0824   K 3.2 (L) 08/15/2021 0824   CL 99 08/15/2021 0824   CO2 32 08/15/2021 0824   BUN 25 (H) 08/15/2021 0824   CREATININE 1.26 (H) 08/15/2021 0824      Component Value Date/Time   CALCIUM 8.8 (L) 08/15/2021 0824   ALKPHOS 148 (H) 08/15/2021 0824   AST 21 08/15/2021 0824   ALT 12 08/15/2021 0824   BILITOT 0.5  08/15/2021 0824       Impression and Plan: Vanessa Garrett is a very pleasant 80 yo African American female with stage IV adenocarcinoma of the gallbladder.  Labs reviewed with Dr. Marin Olp and improved. We will proceed with treatment today as planned.  CA 19-9 is pending.  Follow-up in 3 weeks.   Lottie Dawson, NP 12/19/20228:55 AM

## 2021-08-15 NOTE — Patient Instructions (Addendum)
[facility]  Discharge Instructions: Thank you for choosing [facility] to provide your oncology and hematology care.   If you have a lab appointment with the [facility], please go directly to the [facility] and check in at the registration area.  Wear comfortable clothing and clothing appropriate for easy access to any Portacath or PICC line.   We strive to give you quality time with your provider. You may need to reschedule your appointment if you arrive late (15 or more minutes).  Arriving late affects you and other patients whose appointments are after yours.  Also, if you miss three or more appointments without notifying the office, you may be dismissed from the clinic at the providers discretion.      For prescription refill requests, have your pharmacy contact our office and allow 72 hours for refills to be completed.    Today you received the following chemotherapy and/or immunotherapy agents 5-fu, leucovorin,      To help prevent nausea and vomiting after your treatment, we encourage you to take your nausea medication as directed.  BELOW ARE SYMPTOMS THAT SHOULD BE REPORTED IMMEDIATELY: *FEVER GREATER THAN 100.4 F (38 C) OR HIGHER *CHILLS OR SWEATING *NAUSEA AND VOMITING THAT IS NOT CONTROLLED WITH YOUR NAUSEA MEDICATION *UNUSUAL SHORTNESS OF BREATH *UNUSUAL BRUISING OR BLEEDING *URINARY PROBLEMS (pain or burning when urinating, or frequent urination) *BOWEL PROBLEMS (unusual diarrhea, constipation, pain near the anus) TENDERNESS IN MOUTH AND THROAT WITH OR WITHOUT PRESENCE OF ULCERS (sore throat, sores in mouth, or a toothache) UNUSUAL RASH, SWELLING OR PAIN  UNUSUAL VAGINAL DISCHARGE OR ITCHING   Items with * indicate a potential emergency and should be followed up as soon as possible or go to the Emergency Department if any problems should occur.  Please show the CHEMOTHERAPY ALERT CARD or IMMUNOTHERAPY ALERT CARD at check-in  to the Emergency Department and triage nurse. Should you have questions after your visit or need to cancel or reschedule your appointment, please contact [facility]  [phone] and follow the prompts.  Office hours are 8:00 a.m. to 4:30 p.m. Monday - Friday. Please note that voicemails left after 4:00 p.m. may not be returned until the following business day.  We are closed weekends and major holidays. You have access to a nurse at all times for urgent questions. Please call the main number to the clinic [phone] and follow the prompts.  For any non-urgent questions, you may also contact your provider using MyChart. We now offer e-Visits for anyone 85 and older to request care online for non-urgent symptoms. For details visit [email or web address].   Also download the MyChart app! Go to the app store, search "MyChart", open the app, select [facility], and log in with your MyChart username and password.  Due to Covid, a mask is required upon entering the hospital/clinic. If you do not have a mask, one will be given to you upon arrival. For doctor visits, patients may have 1 support person aged 44 or older with them. For treatment visits, patients cannot have anyone with them due to current Covid guidelines and our immunocompromised population. Fluorouracil, 5-FU injection What is this medication? FLUOROURACIL, 5-FU (flure oh YOOR a sil) is a chemotherapy drug. It slows the growth of cancer cells. This medicine is used to treat many types of cancer like breast cancer, colon or rectal cancer, pancreatic cancer, and stomach cancer. This medicine may be used for other  purposes; ask your health care provider or pharmacist if you have questions. COMMON BRAND NAME(S): Adrucil, imfinzi, oxaliplatin What should I tell my care team before I take this medication? They need to know if you have any of these conditions: blood disorders dihydropyrimidine dehydrogenase (DPD)  deficiency infection (especially a virus infection such as chickenpox, cold sores, or herpes) kidney disease liver disease malnourished, poor nutrition recent or ongoing radiation therapy an unusual or allergic reaction to fluorouracil, other chemotherapy, other medicines, foods, dyes, or preservatives pregnant or trying to get pregnant breast-feeding How should I use this medication? This drug is given as an infusion or injection into a vein. It is administered in a hospital or clinic by a specially trained health care professional. Talk to your pediatrician regarding the use of this medicine in children. Special care may be needed. Overdosage: If you think you have taken too much of this medicine contact a poison control center or emergency room at once. NOTE: This medicine is only for you. Do not share this medicine with others. What if I miss a dose? It is important not to miss your dose. Call your doctor or health care professional if you are unable to keep an appointment. What may interact with this medication? Do not take this medicine with any of the following medications: live virus vaccines This medicine may also interact with the following medications: medicines that treat or prevent blood clots like warfarin, enoxaparin, and dalteparin This list may not describe all possible interactions. Give your health care provider a list of all the medicines, herbs, non-prescription drugs, or dietary supplements you use. Also tell them if you smoke, drink alcohol, or use illegal drugs. Some items may interact with your medicine. What should I watch for while using this medication? Visit your doctor for checks on your progress. This drug may make you feel generally unwell. This is not uncommon, as chemotherapy can affect healthy cells as well as cancer cells. Report any side effects. Continue your course of treatment even though you feel ill unless your doctor tells you to stop. In some cases,  you may be given additional medicines to help with side effects. Follow all directions for their use. Call your doctor or health care professional for advice if you get a fever, chills or sore throat, or other symptoms of a cold or flu. Do not treat yourself. This drug decreases your body's ability to fight infections. Try to avoid being around people who are sick. This medicine may increase your risk to bruise or bleed. Call your doctor or health care professional if you notice any unusual bleeding. Be careful brushing and flossing your teeth or using a toothpick because you may get an infection or bleed more easily. If you have any dental work done, tell your dentist you are receiving this medicine. Avoid taking products that contain aspirin, acetaminophen, ibuprofen, naproxen, or ketoprofen unless instructed by your doctor. These medicines may hide a fever. Do not become pregnant while taking this medicine. Women should inform their doctor if they wish to become pregnant or think they might be pregnant. There is a potential for serious side effects to an unborn child. Talk to your health care professional or pharmacist for more information. Do not breast-feed an infant while taking this medicine. Men should inform their doctor if they wish to father a child. This medicine may lower sperm counts. Do not treat diarrhea with over the counter products. Contact your doctor if you have diarrhea that lasts  more than 2 days or if it is severe and watery. This medicine can make you more sensitive to the sun. Keep out of the sun. If you cannot avoid being in the sun, wear protective clothing and use sunscreen. Do not use sun lamps or tanning beds/booths. What side effects may I notice from receiving this medication? Side effects that you should report to your doctor or health care professional as soon as possible: allergic reactions like skin rash, itching or hives, swelling of the face, lips, or tongue low  blood counts - this medicine may decrease the number of white blood cells, red blood cells and platelets. You may be at increased risk for infections and bleeding. signs of infection - fever or chills, cough, sore throat, pain or difficulty passing urine signs of decreased platelets or bleeding - bruising, pinpoint red spots on the skin, black, tarry stools, blood in the urine signs of decreased red blood cells - unusually weak or tired, fainting spells, lightheadedness breathing problems changes in vision chest pain mouth sores nausea and vomiting pain, swelling, redness at site where injected pain, tingling, numbness in the hands or feet redness, swelling, or sores on hands or feet stomach pain unusual bleeding Side effects that usually do not require medical attention (report to your doctor or health care professional if they continue or are bothersome): changes in finger or toe nails diarrhea dry or itchy skin hair loss headache loss of appetite sensitivity of eyes to the light stomach upset unusually teary eyes This list may not describe all possible side effects. Call your doctor for medical advice about side effects. You may report side effects to FDA at 1-800-FDA-1088. Where should I keep my medication? This drug is given in a hospital or clinic and will not be stored at home. NOTE: This sheet is a summary. It may not cover all possible information. If you have questions about this medicine, talk to your doctor, pharmacist, or health care provider.  2022 Elsevier/Gold Standard (2021-05-03 00:00:00)

## 2021-08-15 NOTE — Patient Instructions (Signed)

## 2021-08-16 ENCOUNTER — Telehealth: Payer: Self-pay | Admitting: *Deleted

## 2021-08-16 ENCOUNTER — Encounter: Payer: Self-pay | Admitting: Hematology & Oncology

## 2021-08-16 LAB — TSH: TSH: 1.212 u[IU]/mL (ref 0.308–3.960)

## 2021-08-16 LAB — CANCER ANTIGEN 19-9: CA 19-9: 718 U/mL — ABNORMAL HIGH (ref 0–35)

## 2021-08-16 LAB — T4: T4, Total: 8.6 ug/dL (ref 4.5–12.0)

## 2021-08-16 NOTE — Telephone Encounter (Signed)
Per 08/15/21 los - called and upcoming appointments - confirmed

## 2021-08-16 NOTE — Progress Notes (Signed)
Oncology Nurse Navigator Documentation  Oncology Nurse Navigator Flowsheets 08/15/2021  Abnormal Finding Date -  Planned Course of Treatment -  Phase of Treatment -  Chemotherapy Actual Start Date: -  Navigator Follow Up Date: 09/06/2021  Navigator Follow Up Reason: Follow-up Appointment;Chemotherapy  Navigator Location [facility]  Referral Date to RadOnc/MedOnc -  Navigator Encounter Type Appt/Treatment Plan Review;Treatment  Telephone -  Treatment Initiated Date -  Patient Visit Type MedOnc  Treatment Phase Active Tx  Barriers/Navigation Needs Coordination of Care;Education  Education -  Interventions Psycho-Social Support  Acuity Level 2-Minimal Needs (1-2 Barriers Identified)  Referrals -  Coordination of Care -  Education Method -  Support Groups/Services Friends and Family  Time Spent with Patient 15

## 2021-08-17 ENCOUNTER — Encounter: Payer: Self-pay | Admitting: *Deleted

## 2021-08-17 ENCOUNTER — Inpatient Hospital Stay: Payer: Medicare PPO

## 2021-08-17 ENCOUNTER — Other Ambulatory Visit: Payer: Self-pay

## 2021-08-17 VITALS — BP 143/65 | HR 60 | Temp 98.1°F | Resp 18

## 2021-08-17 DIAGNOSIS — R160 Hepatomegaly, not elsewhere classified: Secondary | ICD-10-CM

## 2021-08-17 DIAGNOSIS — Z5111 Encounter for antineoplastic chemotherapy: Secondary | ICD-10-CM | POA: Diagnosis not present

## 2021-08-17 MED ORDER — SODIUM CHLORIDE 0.9% FLUSH
10.0000 mL | INTRAVENOUS | Status: DC | PRN
  Administered 2021-08-17: 09:00:00 10 mL

## 2021-08-17 MED ORDER — HEPARIN SOD (PORK) LOCK FLUSH 100 UNIT/ML IV SOLN
500.0000 [IU] | Freq: Once | INTRAVENOUS | Status: AC | PRN
  Administered 2021-08-17: 09:00:00 500 [IU]

## 2021-08-17 NOTE — Patient Instructions (Signed)

## 2021-08-17 NOTE — Progress Notes (Signed)
Patient in for observation and port flush after chemo disconnected at home. Dr.Ennever is okay not giving anymore chemo today as she got most of it, her port site is clean, dry and no tenderness. Reaccessed and flushed and deaccessed  with no issues. Erline Levine, RN reviewed chemo and pump and discarded per protocol. Re-Educated patient on not showering while port and pump are connected. She verbalized understanding, Patient discharged with no questions or concerns at this time.

## 2021-08-17 NOTE — Progress Notes (Signed)
Received a call from patient's daughter that patient was in the shower this morning and accidentally pulled out her port needle. She is currently connected to a 5FU pump.   Instructed the daughter to place the pump and all it's tubing into a bag/container which would contain any fluids so that a potential chemo exposure can be avoided. Also instructed Vanessa Garrett to bring patient to the office now. She agreed.   Vanessa Garrett states the pump was at 14.81cc remaining to be infused when the port was deaccessed.   Nursing staff notified of above events.   Oncology Nurse Navigator Documentation  Oncology Nurse Navigator Flowsheets 08/17/2021  Abnormal Finding Date -  Planned Course of Treatment -  Phase of Treatment -  Chemotherapy Actual Start Date: -  Navigator Follow Up Date: 09/06/2021  Navigator Follow Up Reason: Follow-up Appointment;Chemotherapy  Navigator Location [facility]  Referral Date to RadOnc/MedOnc -  Navigator Encounter Type Telephone  Telephone Patient Update;Incoming Call  Treatment Initiated Date -  Patient Visit Type MedOnc  Treatment Phase Active Tx  Barriers/Navigation Needs Coordination of Care;Education  Education Other  Interventions Education  Acuity Level 2-Minimal Needs (1-2 Barriers Identified)  Referrals -  Coordination of Care -  Education Method Verbal  Support Groups/Services Friends and Family  Time Spent with Patient 15

## 2021-08-30 ENCOUNTER — Other Ambulatory Visit: Payer: Self-pay | Admitting: *Deleted

## 2021-08-30 MED ORDER — DRONABINOL 2.5 MG PO CAPS: 2.5000 mg | ORAL_CAPSULE | Freq: Two times a day (BID) | ORAL | 0 refills | Status: DC

## 2021-09-06 ENCOUNTER — Encounter: Payer: Self-pay | Admitting: *Deleted

## 2021-09-06 ENCOUNTER — Other Ambulatory Visit: Payer: Self-pay

## 2021-09-06 ENCOUNTER — Inpatient Hospital Stay: Payer: Medicare PPO

## 2021-09-06 ENCOUNTER — Encounter: Payer: Self-pay | Admitting: Hematology & Oncology

## 2021-09-06 ENCOUNTER — Inpatient Hospital Stay (HOSPITAL_BASED_OUTPATIENT_CLINIC_OR_DEPARTMENT_OTHER): Payer: Medicare PPO | Admitting: Hematology & Oncology

## 2021-09-06 ENCOUNTER — Inpatient Hospital Stay: Payer: Medicare PPO | Attending: Hematology & Oncology

## 2021-09-06 VITALS — BP 192/78 | HR 64 | Temp 98.2°F | Resp 17 | Wt 151.0 lb

## 2021-09-06 VITALS — BP 168/80 | HR 60 | Resp 18

## 2021-09-06 DIAGNOSIS — Z5112 Encounter for antineoplastic immunotherapy: Secondary | ICD-10-CM | POA: Diagnosis present

## 2021-09-06 DIAGNOSIS — R160 Hepatomegaly, not elsewhere classified: Secondary | ICD-10-CM

## 2021-09-06 DIAGNOSIS — Z5111 Encounter for antineoplastic chemotherapy: Secondary | ICD-10-CM | POA: Insufficient documentation

## 2021-09-06 DIAGNOSIS — C779 Secondary and unspecified malignant neoplasm of lymph node, unspecified: Secondary | ICD-10-CM | POA: Diagnosis not present

## 2021-09-06 DIAGNOSIS — C23 Malignant neoplasm of gallbladder: Secondary | ICD-10-CM | POA: Diagnosis not present

## 2021-09-06 DIAGNOSIS — Z79899 Other long term (current) drug therapy: Secondary | ICD-10-CM | POA: Insufficient documentation

## 2021-09-06 DIAGNOSIS — C787 Secondary malignant neoplasm of liver and intrahepatic bile duct: Secondary | ICD-10-CM | POA: Diagnosis not present

## 2021-09-06 DIAGNOSIS — D500 Iron deficiency anemia secondary to blood loss (chronic): Secondary | ICD-10-CM

## 2021-09-06 DIAGNOSIS — E032 Hypothyroidism due to medicaments and other exogenous substances: Secondary | ICD-10-CM

## 2021-09-06 LAB — CMP (CANCER CENTER ONLY)
ALT: 12 U/L (ref 0–44)
AST: 24 U/L (ref 15–41)
Albumin: 3.7 g/dL (ref 3.5–5.0)
Alkaline Phosphatase: 175 U/L — ABNORMAL HIGH (ref 38–126)
Anion gap: 7 (ref 5–15)
BUN: 15 mg/dL (ref 8–23)
CO2: 34 mmol/L — ABNORMAL HIGH (ref 22–32)
Calcium: 9.3 mg/dL (ref 8.9–10.3)
Chloride: 96 mmol/L — ABNORMAL LOW (ref 98–111)
Creatinine: 1.17 mg/dL — ABNORMAL HIGH (ref 0.44–1.00)
GFR, Estimated: 47 mL/min — ABNORMAL LOW (ref >60)
Glucose, Bld: 144 mg/dL — ABNORMAL HIGH (ref 70–99)
Potassium: 3.1 mmol/L — ABNORMAL LOW (ref 3.5–5.1)
Sodium: 137 mmol/L (ref 135–145)
Total Bilirubin: 0.5 mg/dL (ref 0.3–1.2)
Total Protein: 6 g/dL — ABNORMAL LOW (ref 6.5–8.1)

## 2021-09-06 LAB — CBC WITH DIFFERENTIAL (CANCER CENTER ONLY)
Abs Immature Granulocytes: 0.01 10*3/uL (ref 0.00–0.07)
Basophils Absolute: 0 10*3/uL (ref 0.0–0.1)
Basophils Relative: 0 %
Eosinophils Absolute: 0 10*3/uL (ref 0.0–0.5)
Eosinophils Relative: 0 %
HCT: 43.1 % (ref 36.0–46.0)
Hemoglobin: 13.7 g/dL (ref 12.0–15.0)
Immature Granulocytes: 0 %
Lymphocytes Relative: 20 %
Lymphs Abs: 0.5 10*3/uL — ABNORMAL LOW (ref 0.7–4.0)
MCH: 32.2 pg (ref 26.0–34.0)
MCHC: 31.8 g/dL (ref 30.0–36.0)
MCV: 101.4 fL — ABNORMAL HIGH (ref 80.0–100.0)
Monocytes Absolute: 0.5 10*3/uL (ref 0.1–1.0)
Monocytes Relative: 19 %
Neutro Abs: 1.4 10*3/uL — ABNORMAL LOW (ref 1.7–7.7)
Neutrophils Relative %: 61 %
Platelet Count: 98 10*3/uL — ABNORMAL LOW (ref 150–400)
RBC: 4.25 MIL/uL (ref 3.87–5.11)
RDW: 17.2 % — ABNORMAL HIGH (ref 11.5–15.5)
WBC Count: 2.4 10*3/uL — ABNORMAL LOW (ref 4.0–10.5)
nRBC: 0 % (ref 0.0–0.2)

## 2021-09-06 LAB — TSH: TSH: 1.582 u[IU]/mL (ref 0.308–3.960)

## 2021-09-06 LAB — LACTATE DEHYDROGENASE: LDH: 356 U/L — ABNORMAL HIGH (ref 98–192)

## 2021-09-06 MED ORDER — HEPARIN SOD (PORK) LOCK FLUSH 100 UNIT/ML IV SOLN: 500.0000 [IU] | Freq: Once | INTRAVENOUS | Status: DC | PRN

## 2021-09-06 MED ORDER — OXALIPLATIN CHEMO INJECTION 100 MG/20ML
68.0000 mg/m2 | Freq: Once | INTRAVENOUS | Status: AC
  Administered 2021-09-06: 115 mg via INTRAVENOUS
  Filled 2021-09-06: qty 20

## 2021-09-06 MED ORDER — SODIUM CHLORIDE 0.9 % IV SOLN
1500.0000 mg | Freq: Once | INTRAVENOUS | Status: AC
  Administered 2021-09-06: 1500 mg via INTRAVENOUS
  Filled 2021-09-06: qty 30

## 2021-09-06 MED ORDER — PALONOSETRON HCL INJECTION 0.25 MG/5ML
0.2500 mg | Freq: Once | INTRAVENOUS | Status: AC
  Administered 2021-09-06: 0.25 mg via INTRAVENOUS
  Filled 2021-09-06: qty 5

## 2021-09-06 MED ORDER — CLONIDINE HCL 0.1 MG PO TABS
0.2000 mg | ORAL_TABLET | Freq: Once | ORAL | Status: AC
  Administered 2021-09-06: 0.2 mg via ORAL
  Filled 2021-09-06: qty 2

## 2021-09-06 MED ORDER — SODIUM CHLORIDE 0.9 % IV SOLN: Freq: Once | INTRAVENOUS | Status: AC

## 2021-09-06 MED ORDER — LEUCOVORIN CALCIUM INJECTION 350 MG
400.0000 mg/m2 | Freq: Once | INTRAVENOUS | Status: AC
  Administered 2021-09-06: 684 mg via INTRAVENOUS
  Filled 2021-09-06: qty 34.2

## 2021-09-06 MED ORDER — DEXTROSE 5 % IV SOLN: Freq: Once | INTRAVENOUS | Status: AC

## 2021-09-06 MED ORDER — SODIUM CHLORIDE 0.9% FLUSH: 10.0000 mL | INTRAVENOUS | Status: DC | PRN

## 2021-09-06 MED ORDER — SODIUM CHLORIDE 0.9 % IV SOLN
10.0000 mg | Freq: Once | INTRAVENOUS | Status: AC
  Administered 2021-09-06: 10 mg via INTRAVENOUS
  Filled 2021-09-06: qty 10

## 2021-09-06 MED ORDER — SODIUM CHLORIDE 0.9 % IV SOLN
1920.0000 mg/m2 | INTRAVENOUS | Status: DC
  Administered 2021-09-06: 3300 mg via INTRAVENOUS
  Filled 2021-09-06: qty 66

## 2021-09-06 MED ORDER — FLUOROURACIL CHEMO INJECTION 2.5 GM/50ML
320.0000 mg/m2 | Freq: Once | INTRAVENOUS | Status: AC
  Administered 2021-09-06: 550 mg via INTRAVENOUS
  Filled 2021-09-06: qty 11

## 2021-09-06 NOTE — Patient Instructions (Signed)
[facility]  Discharge Instructions: Thank you for choosing [facility] to provide your oncology and hematology care.   If you have a lab appointment with the [facility], please go directly to the [facility] and check in at the registration area.  Wear comfortable clothing and clothing appropriate for easy access to any Portacath or PICC line.   We strive to give you quality time with your provider. You may need to reschedule your appointment if you arrive late (15 or more minutes).  Arriving late affects you and other patients whose appointments are after yours.  Also, if you miss three or more appointments without notifying the office, you may be dismissed from the clinic at the providers discretion.      For prescription refill requests, have your pharmacy contact our office and allow 72 hours for refills to be completed.    Today you received the following chemotherapy and/or immunotherapy agents Folfox       To help prevent nausea and vomiting after your treatment, we encourage you to take your nausea medication as directed.  BELOW ARE SYMPTOMS THAT SHOULD BE REPORTED IMMEDIATELY: *FEVER GREATER THAN 100.4 F (38 C) OR HIGHER *CHILLS OR SWEATING *NAUSEA AND VOMITING THAT IS NOT CONTROLLED WITH YOUR NAUSEA MEDICATION *UNUSUAL SHORTNESS OF BREATH *UNUSUAL BRUISING OR BLEEDING *URINARY PROBLEMS (pain or burning when urinating, or frequent urination) *BOWEL PROBLEMS (unusual diarrhea, constipation, pain near the anus) TENDERNESS IN MOUTH AND THROAT WITH OR WITHOUT PRESENCE OF ULCERS (sore throat, sores in mouth, or a toothache) UNUSUAL RASH, SWELLING OR PAIN  UNUSUAL VAGINAL DISCHARGE OR ITCHING   Items with * indicate a potential emergency and should be followed up as soon as possible or go to the Emergency Department if any problems should occur.  Please show the CHEMOTHERAPY ALERT CARD or IMMUNOTHERAPY ALERT CARD at check-in to the  Emergency Department and triage nurse. Should you have questions after your visit or need to cancel or reschedule your appointment, please contact [facility]  [phone] and follow the prompts.  Office hours are 8:00 a.m. to 4:30 p.m. Monday - Friday. Please note that voicemails left after 4:00 p.m. may not be returned until the following business day.  We are closed weekends and major holidays. You have access to a nurse at all times for urgent questions. Please call the main number to the clinic [phone] and follow the prompts.  For any non-urgent questions, you may also contact your provider using MyChart. We now offer e-Visits for anyone 44 and older to request care online for non-urgent symptoms. For details visit [email or web address].   Also download the MyChart app! Go to the app store, search "MyChart", open the app, select [facility], and log in with your MyChart username and password.  Due to Covid, a mask is required upon entering the hospital/clinic. If you do not have a mask, one will be given to you upon arrival. For doctor visits, patients may have 1 support person aged 66 or older with them. For treatment visits, patients cannot have anyone with them due to current Covid guidelines and our immunocompromised population.

## 2021-09-06 NOTE — Progress Notes (Signed)
Cbc and cmet reviewed by MD, ok to treat despite counts

## 2021-09-06 NOTE — Patient Instructions (Signed)

## 2021-09-06 NOTE — Progress Notes (Signed)
Hematology and Oncology Follow Up Visit  Vanessa Garrett [id number] 04/14/1941 81 y.o. 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 Bassette is here today with her daughter for follow-up and treatment.  So far, she has had 2 cycles of treatment.  She tolerated this pretty well.  However, her CA 19-9 has been creeping up slowly.  Last on that we saw her it was up to 700.  She has had no abdominal pain.  She has had no change in bowel or bladder habits.  She still has a leg swelling.  The left leg is more swollen than the right leg..  She does not Retacrit at home.  I told her to decrease the Retacrit down to twice a week.  This might be accounting for her elevated blood pressure today.  She has had no cough or shortness of breath.  She has had no bleeding.  There has been no rashes.  She has had no headaches.  Overall, I would say performance status is probably ECOG 1.  Medications:  Allergies as of 09/06/2021       Reactions   Megace Es [megestrol Acetate] Swelling   Facial swelling        Medication List        Accurate as of September 06, 2021  9:17 AM. If you have any questions, ask your nurse or doctor.          STOP taking these medications    Potassium Chloride ER 20 MEQ Tbcr Stopped by: Volanda Napoleon, MD       TAKE these medications    dexamethasone 4 MG tablet Commonly known as: DECADRON Take 2 tablets (8 mg total) by mouth daily. Start the day after chemotherapy for 2 days.  Take with food.   dronabinol 2.5 MG capsule Commonly known as: MARINOL Take 1 capsule (2.5 mg total) by mouth 2 (two) times daily before a meal.   furosemide 40 MG tablet Commonly known as: LASIX Take 40 mg by mouth daily.   Generlac 10 GM/15ML Soln Generic drug: lactulose (encephalopathy) Take 10 g by mouth daily as needed (constipation).   lidocaine-prilocaine cream Commonly known as: EMLA Apply to affected area once   lidocaine-prilocaine cream Commonly known as: EMLA Apply to affected area once   LORazepam 0.5 MG tablet Commonly known as: Ativan Take 1 tablet (0.5 mg total) by mouth every 6 (six) hours as needed (Nausea or vomiting).   metoprolol tartrate 25 MG tablet Commonly known as: LOPRESSOR TAKE 1 TABLET(25 MG) BY MOUTH TWICE DAILY   NON FORMULARY Take 2 tablets by mouth daily. Mega Food - Blood Builder   NON FORMULARY 1.25 mg every other day. Beet Root Powder   ondansetron 8 MG tablet Commonly known as: Zofran Take 1 tablet (8 mg total) by mouth 2 (two) times daily as needed for refractory nausea / vomiting. Start on day 3 after chemotherapy.   potassium chloride SA 20 MEQ tablet Commonly  known as: KLOR-CON M Take 1 tablet (20 mEq total) by mouth daily.   prochlorperazine 10 MG tablet Commonly known as: COMPAZINE Take 1 tablet (10 mg total) by mouth every 6 (six) hours as needed (Nausea or vomiting).   Retacrit 10000 UNIT/ML injection Generic drug: epoetin alfa-epbx INJECT 1 ML (10,000 UNITS TOTAL) SUBCUTANEOUSLY DAILY.   S.S.S. TONIC PO Take 45 mLs by mouth every other day.   triamterene-hydrochlorothiazide 75-50 MG tablet Commonly known as: MAXZIDE Take 1 tablet by mouth daily.   vitamin B-12 100 MCG tablet Commonly known as: CYANOCOBALAMIN Take 100 mcg by mouth in the morning and at bedtime.   vitamin D3 50 MCG (2000 UT) Caps Take by mouth daily.        Allergies:  Allergies  Allergen Reactions   Megace Es [Megestrol Acetate]  Swelling    Facial swelling    Past Medical History, Surgical history, Social history, and Family History were reviewed and updated.  Review of Systems: Review of Systems  Constitutional: Negative.   HENT: Negative.    Eyes: Negative.   Respiratory: Negative.    Cardiovascular:  Positive for leg swelling.  Gastrointestinal: Negative.   Genitourinary: Negative.   Musculoskeletal: Negative.   Skin: Negative.   Neurological: Negative.   Endo/Heme/Allergies: Negative.   Psychiatric/Behavioral: Negative.      Physical Exam:  weight is 151 lb (68.5 kg). Her oral temperature is 98.2 F (36.8 C). Her blood pressure is 192/78 (abnormal) and her pulse is 64. Her respiration is 17 and oxygen saturation is 99%.   Wt Readings from Last 3 Encounters:  09/06/21 151 lb (68.5 kg)  08/15/21 150 lb 6.4 oz (68.2 kg)  08/08/21 150 lb 12.8 oz (68.4 kg)   Physical Exam Vitals reviewed.  HENT:     Head: Normocephalic and atraumatic.  Eyes:     Pupils: Pupils are equal, round, and reactive to light.  Cardiovascular:     Rate and Rhythm: Normal rate and regular rhythm.     Heart sounds: Normal heart sounds.  Pulmonary:     Effort: Pulmonary effort is normal.     Breath sounds: Normal breath sounds.  Abdominal:     General: Bowel sounds are normal.     Palpations: Abdomen is soft.  Musculoskeletal:        General: No tenderness or deformity. Normal range of motion.     Cervical back: Normal range of motion.     Comments: Extremities shows some swelling in the lower extremities.  She has more swelling in the left leg than on the right leg.  This is about 2+ pitting in the left leg.  Lymphadenopathy:     Cervical: No cervical adenopathy.  Skin:    General: Skin is warm and dry.     Findings: No erythema or rash.  Neurological:     Mental Status: She is alert and oriented to person, place, and time.  Psychiatric:        Behavior: Behavior normal.        Thought Content: Thought content  normal.        Judgment: Judgment normal.      Lab Results  Component Value Date   WBC 2.4 (L) 09/06/2021   HGB 13.7 09/06/2021   HCT 43.1 09/06/2021   MCV 101.4 (H) 09/06/2021   PLT 98 (L) 09/06/2021   Lab Results  Component Value Date   FERRITIN 1,363 (H) 08/08/2021   IRON 33 (L) 08/08/2021   TIBC 220 (  L) 08/08/2021   UIBC 187 08/08/2021   IRONPCTSAT 15 (L) 08/08/2021   Lab Results  Component Value Date   RETICCTPCT 2.2 08/08/2021   RBC 4.25 09/06/2021   No results found for: KPAFRELGTCHN, LAMBDASER, KAPLAMBRATIO No results found for: IGGSERUM, IGA, IGMSERUM No results found for: Odetta Pink, SPEI   Chemistry      Component Value Date/Time   NA 137 09/06/2021 0827   K 3.1 (L) 09/06/2021 0827   CL 96 (L) 09/06/2021 0827   CO2 34 (H) 09/06/2021 0827   BUN 15 09/06/2021 0827   CREATININE 1.17 (H) 09/06/2021 0827      Component Value Date/Time   CALCIUM 9.3 09/06/2021 0827   ALKPHOS 175 (H) 09/06/2021 0827   AST 24 09/06/2021 0827   ALT 12 09/06/2021 0827   BILITOT 0.5 09/06/2021 0827       Impression and Plan: Ms. Ebey is a very pleasant 80 yo African American female with stage IV adenocarcinoma of the gallbladder.   We will go ahead and set her up with a CT scan after this cycle of treatment.  We will have to see what her CA 19-9 looks like.  Her blood counts are okay for treatment.  I think if we do find that she has progressive disease, I might be very difficult to figure out how we can treat her.  Her options are not all that great.  I am glad that her quality of life is still doing pretty well.  She has a wonderful family.  Hopefully, decreasing the Retacrit will help with her blood pressure.   Volanda Napoleon, MD 1/10/20239:17 AM

## 2021-09-07 LAB — CANCER ANTIGEN 19-9: CA 19-9: 866 U/mL — ABNORMAL HIGH (ref 0–35)

## 2021-09-07 NOTE — Addendum Note (Signed)
Addended by: Burney Gauze R on: 09/07/2021 02:06 PM   Modules accepted: Orders

## 2021-09-08 ENCOUNTER — Other Ambulatory Visit: Payer: Self-pay

## 2021-09-08 ENCOUNTER — Other Ambulatory Visit: Payer: Self-pay | Admitting: *Deleted

## 2021-09-08 ENCOUNTER — Inpatient Hospital Stay: Payer: Medicare PPO

## 2021-09-08 VITALS — BP 214/80 | HR 62

## 2021-09-08 DIAGNOSIS — Z95828 Presence of other vascular implants and grafts: Secondary | ICD-10-CM

## 2021-09-08 DIAGNOSIS — Z5111 Encounter for antineoplastic chemotherapy: Secondary | ICD-10-CM | POA: Diagnosis not present

## 2021-09-08 DIAGNOSIS — I10 Essential (primary) hypertension: Secondary | ICD-10-CM

## 2021-09-08 DIAGNOSIS — R160 Hepatomegaly, not elsewhere classified: Secondary | ICD-10-CM

## 2021-09-08 LAB — T4: T4, Total: 10.2 ug/dL (ref 4.5–12.0)

## 2021-09-08 MED ORDER — SODIUM CHLORIDE 0.9% FLUSH
10.0000 mL | Freq: Once | INTRAVENOUS | Status: AC
  Administered 2021-09-08: 10 mL via INTRAVENOUS

## 2021-09-08 MED ORDER — DRONABINOL 2.5 MG PO CAPS: 2.5000 mg | ORAL_CAPSULE | Freq: Two times a day (BID) | ORAL | 0 refills | Status: DC

## 2021-09-08 MED ORDER — CLONIDINE HCL 0.1 MG PO TABS
0.2000 mg | ORAL_TABLET | Freq: Once | ORAL | Status: AC
  Administered 2021-09-08: 0.2 mg via ORAL
  Filled 2021-09-08: qty 2

## 2021-09-08 MED ORDER — SODIUM CHLORIDE 0.9% FLUSH: 10.0000 mL | INTRAVENOUS | Status: DC | PRN

## 2021-09-08 MED ORDER — HEPARIN SOD (PORK) LOCK FLUSH 100 UNIT/ML IV SOLN
500.0000 [IU] | Freq: Once | INTRAVENOUS | Status: AC
  Administered 2021-09-08: 500 [IU] via INTRAVENOUS

## 2021-09-08 MED ORDER — HEPARIN SOD (PORK) LOCK FLUSH 100 UNIT/ML IV SOLN: 500.0000 [IU] | Freq: Once | INTRAVENOUS | Status: DC | PRN

## 2021-09-08 NOTE — Patient Instructions (Signed)
Pt BP 214/80, reviewed with MD,  order for Clonidine 0.2mg  once. Discussed with Pt daughter,  to retake pt Bp at the house and call if still elevated. What is this medication? CLONIDINE (KLOE ni deen) treats high blood pressure. It works by relaxing blood vessels, which decreases blood pressure and the amount of work the heart has to do. This medicine may be used for other purposes; ask your health care provider or pharmacist if you have questions. COMMON BRAND NAME(S): Catapres What should I tell my care team before I take this medication? They need to know if you have any of these conditions: Kidney disease An unusual or allergic reaction to clonidine, other medications, foods, dyes, or preservatives Pregnant or trying to get pregnant Breast-feeding How should I use this medication? Take this medication by mouth. Take it as directed on the prescription label at the same time every day. You can take it with or without food. If it upsets your stomach, take it with food. Keep taking it unless your care team tells you to stop. Talk to your care team about the use of this drug in children. Special care may be needed. Overdosage: If you think you have taken too much of this medicine contact a poison control center or emergency room at once. NOTE: This medicine is only for you. Do not share this medicine with others. What if I miss a dose? If you miss a dose, take it as soon as you can. If it is almost time for your next dose, take only that dose. Do not take double or extra doses. What may interact with this medication? Do not take this medication with any of the following: MAOIs like Carbex, Eldepryl, Marplan, Nardil, and Parnate This medication may also interact with the following: Barbiturate medications for inducing sleep or treating seizures like phenobarbital Certain medications for blood pressure, heart disease, irregular heart beat Certain medications for depression, anxiety, or  psychotic disturbances Prescription pain medications This list may not describe all possible interactions. Give your health care provider a list of all the medicines, herbs, non-prescription drugs, or dietary supplements you use. Also tell them if you smoke, drink alcohol, or use illegal drugs. Some items may interact with your medicine. What should I watch for while using this medication? Visit your care team for regular checks on your progress. Check your heart rate and blood pressure regularly while you are taking this medication. Ask your care team what your heart rate should be and when you should contact them. You may get drowsy or dizzy. Do not drive, use machinery, or do anything that needs mental alertness until you know how this medication affects you. To avoid dizzy or fainting spells, do not stand or sit up quickly, especially if you are an older person. Alcohol can make you more drowsy and dizzy. Avoid alcoholic drinks. Your mouth may get dry. Chewing sugarless gum or sucking hard candy, and drinking plenty of water will help. Do not treat yourself for coughs, colds, or pain while you are taking this medication without asking your care team for advice. Some ingredients may increase your blood pressure. If you are going to have surgery tell your care team that you are taking this medication. What side effects may I notice from receiving this medication? Side effects that you should report to your care team as soon as possible: Allergic reactions--skin rash, itching, hives, swelling of the face, lips, tongue, or throat Low blood pressure--dizziness, feeling faint or lightheaded, blurry  vision Slow heartbeat--dizziness, feeling faint or lightheaded, confusion, trouble breathing, unusual weakness or fatigue Side effects that usually do not require medical attention (report to your care team if they continue or are bothersome): Constipation Dizziness Drowsiness Dry eyes Dry  mouth Fatigue This list may not describe all possible side effects. Call your doctor for medical advice about side effects. You may report side effects to FDA at 1-800-FDA-1088. Where should I keep my medication? Keep out of the reach of children and pets. Store at room temperature between 15 and 30 degrees C (59 and 86 degrees F). Throw away any unused medication after the expiration date. NOTE: This sheet is a summary. It may not cover all possible information. If you have questions about this medicine, talk to your doctor, pharmacist, or health care provider.  2022 Elsevier/Gold Standard (2021-05-03 00:00:00)

## 2021-09-13 ENCOUNTER — Encounter: Payer: Self-pay | Admitting: Hematology & Oncology

## 2021-09-13 ENCOUNTER — Encounter: Payer: Self-pay | Admitting: *Deleted

## 2021-09-13 NOTE — Progress Notes (Signed)
Patient has not been scheduled for her MRI. Due to family scheduling difficulties with taking into account work and travel, I called and spoke to patient's daughter Vanessa Garrett. I gave her the number for centralized scheduling so that she could schedule Vanessa Garrett's MRI at a time that worked for them.   Patient daughter tried to schedule however they wouldn't schedule within desired time frame because of patient's recent iron infusion.   Due to patient's inability to get blood, she is dependant on iron and the infusion to MRI time must be overridden each time. Scheduled the MRI myself.  Daughter Vanessa Garrett is aware of appointment including time, date, location and NPO status.  Oncology Nurse Navigator Documentation  Oncology Nurse Navigator Flowsheets 09/13/2021  Abnormal Finding Date -  Planned Course of Treatment -  Phase of Treatment -  Chemotherapy Actual Start Date: -  Navigator Follow Up Date: 09/26/2021  Navigator Follow Up Reason: Scan Review  Navigator Location [facility]  Referral Date to RadOnc/MedOnc -  Navigator Encounter Type Appt/Treatment Plan Review;Telephone  Telephone Education;Outgoing Call  Treatment Initiated Date -  Patient Visit Type MedOnc  Treatment Phase Active Tx  Barriers/Navigation Needs Coordination of Care;Education  Education Other  Interventions Coordination of Care;Education  Acuity Level 2-Minimal Needs (1-2 Barriers Identified)  Referrals -  Coordination of Care Radiology  Education Method Verbal  Support Groups/Services Friends and Family  Time Spent with Patient 74

## 2021-09-13 NOTE — Progress Notes (Signed)
Patient needs MRI prior to her next appointment. Will follow up to schedule once PA is obtained.   Oncology Nurse Navigator Documentation  Oncology Nurse Navigator Flowsheets 09/06/2021  Abnormal Finding Date -  Planned Course of Treatment -  Phase of Treatment -  Chemotherapy Actual Start Date: -  Navigator Follow Up Date: 09/13/2021  Navigator Follow Up Reason: Appointment Review  Navigator Location [facility]  Referral Date to RadOnc/MedOnc -  Navigator Encounter Type Appt/Treatment Plan Review  Telephone -  Treatment Initiated Date -  Patient Visit Type MedOnc  Treatment Phase Active Tx  Barriers/Navigation Needs Coordination of Care;Education  Education -  Interventions None Required  Acuity Level 2-Minimal Needs (1-2 Barriers Identified)  Referrals -  Coordination of Care -  Education Method -  Support Groups/Services Friends and Family  Time Spent with Patient 15

## 2021-09-14 ENCOUNTER — Encounter: Payer: Self-pay | Admitting: *Deleted

## 2021-09-14 ENCOUNTER — Encounter: Payer: Self-pay | Admitting: Hematology & Oncology

## 2021-09-14 NOTE — Progress Notes (Signed)
Received a call from patient's daughter, Vanessa Garrett. They have been monitoring patient's blood pressure at home and the last few days patient has consistently been in the 170s over 90s. She is taking her lopressor 25mg  BID. The daughter would like to know if there is something else that should be done.  Spoke to Lottie Dawson NP. She would like patient to follow up with her PCP. We have prescribed initial BP treatment, but since this has been unresponsive, and her BP is unlikely to be affected by her treatment, the PCP should assess and treat.   Called and notified Vanessa Garrett of NP response. She will follow up with patient's PCP.   Oncology Nurse Navigator Documentation  Oncology Nurse Navigator Flowsheets 09/14/2021  Abnormal Finding Date -  Planned Course of Treatment -  Phase of Treatment -  Chemotherapy Actual Start Date: -  Navigator Follow Up Date: 09/26/2021  Navigator Follow Up Reason: Scan Review  Navigator Location [facility]  Referral Date to RadOnc/MedOnc -  Navigator Encounter Type Telephone  Telephone Symptom Mgt;Incoming Call  Treatment Initiated Date -  Patient Visit Type MedOnc  Treatment Phase Active Tx  Barriers/Navigation Needs Coordination of Care;Education  Education Pain/ Symptom Management  Interventions Education  Acuity Level 2-Minimal Needs (1-2 Barriers Identified)  Referrals -  Coordination of Care -  Education Method Verbal  Support Groups/Services Friends and Family  Time Spent with Patient 30

## 2021-09-19 ENCOUNTER — Other Ambulatory Visit: Payer: Self-pay | Admitting: *Deleted

## 2021-09-19 DIAGNOSIS — D500 Iron deficiency anemia secondary to blood loss (chronic): Secondary | ICD-10-CM

## 2021-09-19 DIAGNOSIS — D631 Anemia in chronic kidney disease: Secondary | ICD-10-CM

## 2021-09-19 DIAGNOSIS — C23 Malignant neoplasm of gallbladder: Secondary | ICD-10-CM

## 2021-09-19 MED ORDER — RETACRIT 10000 UNIT/ML IJ SOLN: INTRAMUSCULAR | 2 refills | Status: DC

## 2021-09-22 ENCOUNTER — Other Ambulatory Visit: Payer: Self-pay | Admitting: *Deleted

## 2021-09-22 DIAGNOSIS — D500 Iron deficiency anemia secondary to blood loss (chronic): Secondary | ICD-10-CM

## 2021-09-22 DIAGNOSIS — C23 Malignant neoplasm of gallbladder: Secondary | ICD-10-CM

## 2021-09-22 DIAGNOSIS — D631 Anemia in chronic kidney disease: Secondary | ICD-10-CM

## 2021-09-22 MED ORDER — RETACRIT 10000 UNIT/ML IJ SOLN: INTRAMUSCULAR | 2 refills | Status: AC

## 2021-09-24 ENCOUNTER — Ambulatory Visit (HOSPITAL_COMMUNITY)
Admission: RE | Admit: 2021-09-24 | Discharge: 2021-09-24 | Disposition: A | Discharge status: Home / Self Care | Payer: Medicare PPO | Source: Ambulatory Visit | Attending: Hematology & Oncology | Admitting: Hematology & Oncology

## 2021-09-24 DIAGNOSIS — C23 Malignant neoplasm of gallbladder: Secondary | ICD-10-CM | POA: Diagnosis not present

## 2021-09-24 IMAGING — MR MR ABDOMEN WO/W CM
20 series · 48 of 48 positions shown · IV contrast (gadavist)
Comparison: [DATE]

CLINICAL DATA: Metastatic gallbladder cancer.

EXAM:
MRI ABDOMEN WITHOUT AND WITH CONTRAST
TECHNIQUE: Multiplanar multisequence MR imaging of the abdomen was performed
both before and after the administration of intravenous contrast.
CONTRAST:  7mL GADAVIST GADOBUTROL 1 MMOL/ML IV SOLN

[Series 5: T2 · coronal · 6.0mm · 1.56mm/px · 2 of 34 slices shown (1 of 2)]
[im 1/34]
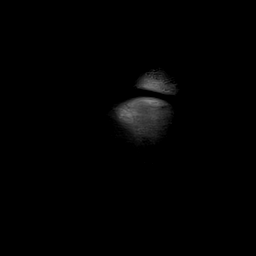
[im 34/34]
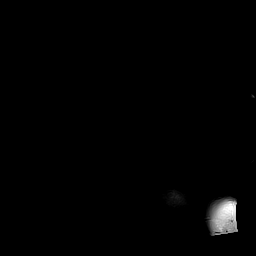

[Series 6: T2 fat-sat · axial · 6.0mm · 1.56mm/px · 1 of 42 slices shown (1 of 3)]
[im 1/42]
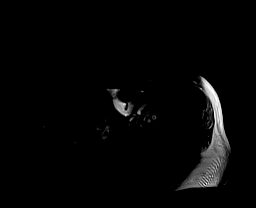

[Series 7: T2 fat-sat · 1 of 7 slices shown (2 of 3)]
[im 1/7]
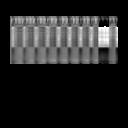

[Series 8: T2 fat-sat · axial · 6.0mm · 1.25mm/px · 1 of 40 slices shown (3 of 3)]
[im 1/40]
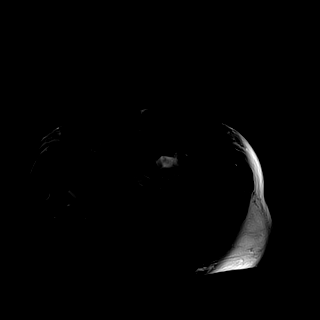

[Series 9: T1 · axial · 3.0mm · 1.25mm/px · z∈[-100,+161]mm · 3 of 88 slices shown (1 of 2)]
[im 1/88]
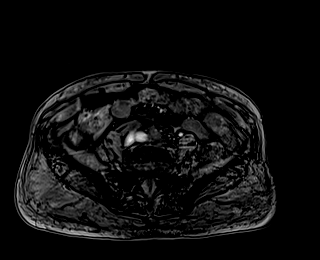
[im 44/88]
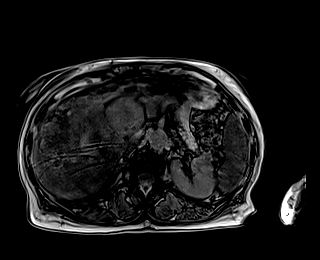
[im 88/88]
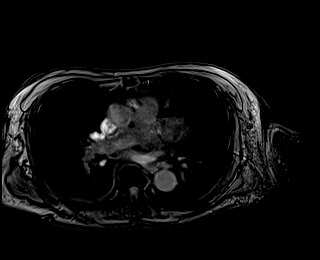

[Series 10: T1 · axial · 3.0mm · 1.25mm/px · z∈[-100,+161]mm · 3 of 88 slices shown (2 of 2)]
[im 1/88]
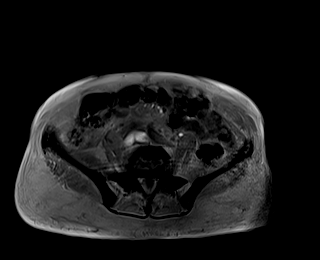
[im 44/88]
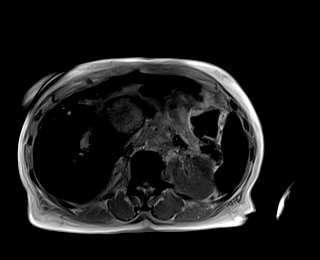
[im 88/88]
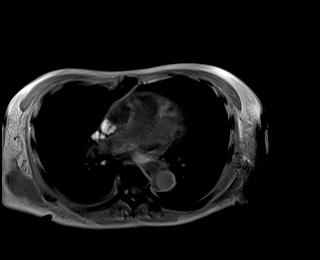

[Series 11: DWI · axial · 6.0mm · 1.49mm/px · z∈[-120,+161]mm · 3 of 79 slices shown (1 of 2)]
[im 1/79]
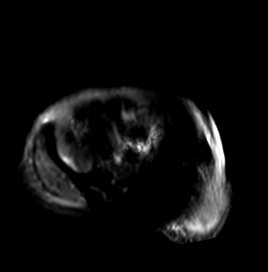
[im 40/79]
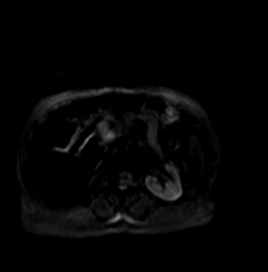
[im 79/79]
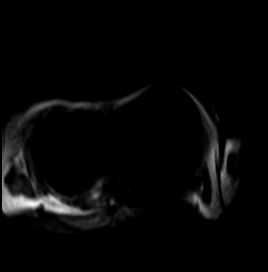

[Series 12: DWI · axial · 6.0mm · 1.49mm/px · 1 of 40 slices shown (2 of 2)]
[im 1/40]
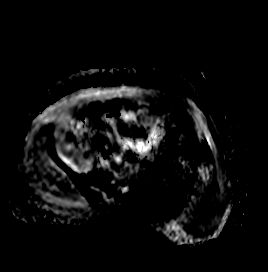

[Series 13: bSSFP · axial · 4.0mm · 0.84mm/px · z∈[-97,+159]mm · 2 of 65 slices shown]
[im 1/65]
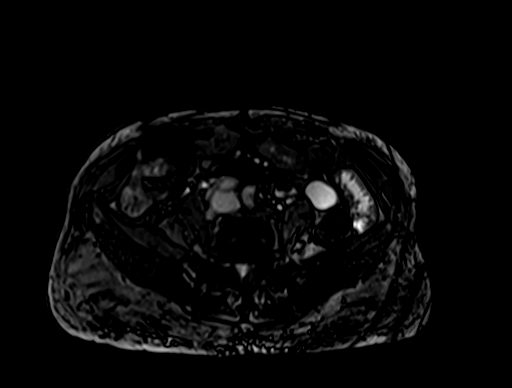
[im 65/65]
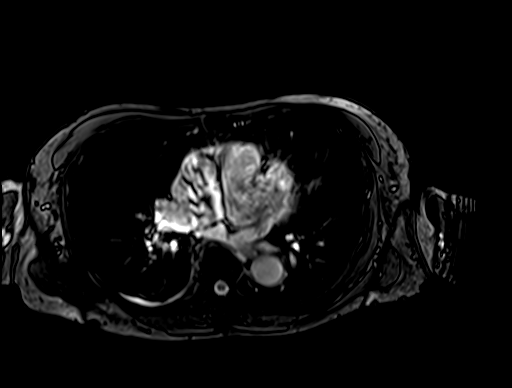

[Series 15: T1 dynamic · axial · 3.0mm · 1.25mm/px · z∈[-106,+155]mm · 3 of 88 slices shown (1 of 10)]
[im 1/88]
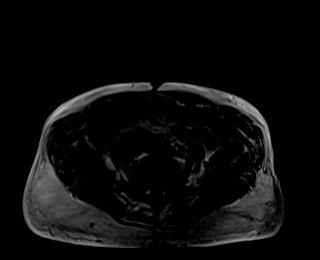
[im 44/88]
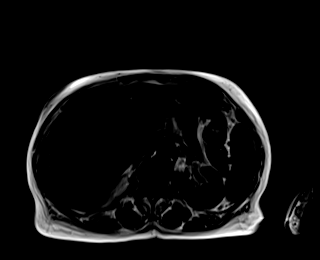
[im 88/88]
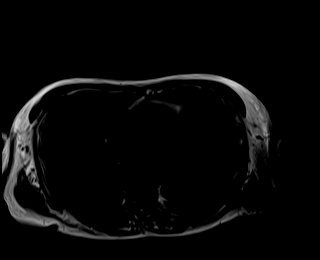

[Series 19: T1 dynamic · axial · 3.0mm · 1.25mm/px · z∈[-106,+155]mm · 3 of 88 slices shown (2 of 10)]
[im 1/88]
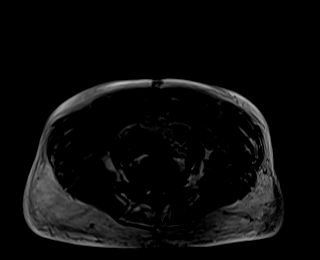
[im 44/88]
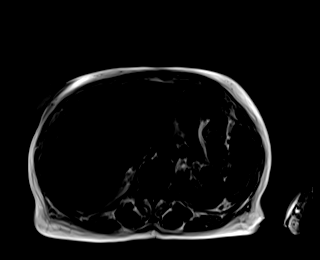
[im 88/88]
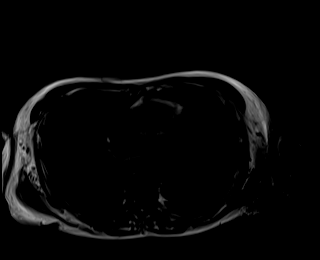

[Series 20: T1 dynamic · axial · 3.0mm · 1.25mm/px · z∈[-103,+155]mm · 3 of 87 slices shown (3 of 10)]
[im 1/87]
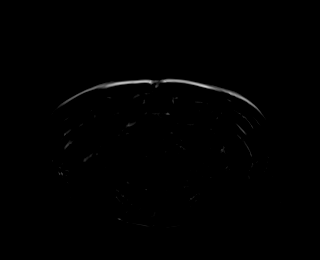
[im 44/87]
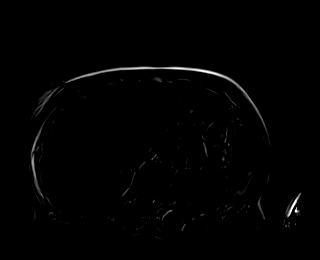
[im 87/87]
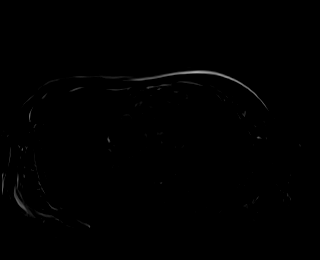

[Series 23: T1 dynamic · axial · 3.0mm · 1.25mm/px · z∈[-106,+155]mm · 3 of 88 slices shown (4 of 10)]
[im 1/88]
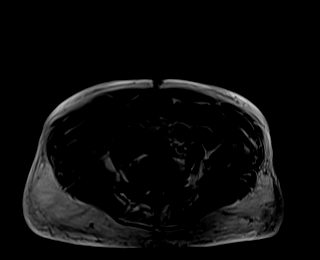
[im 44/88]
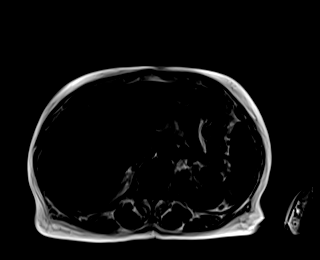
[im 88/88]
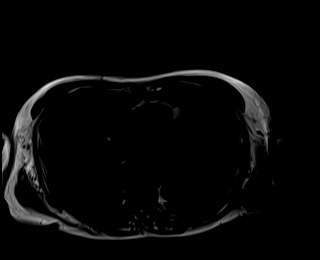

[Series 24: T1 dynamic · axial · 3.0mm · 1.25mm/px · z∈[-106,+155]mm · 3 of 87 slices shown (5 of 10)]
[im 1/87]
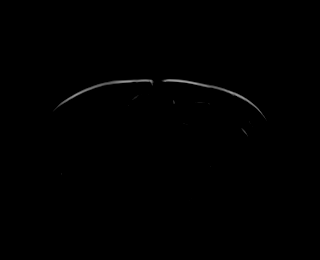
[im 44/87]
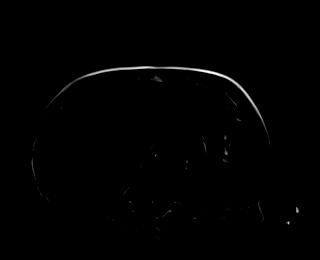
[im 87/87]
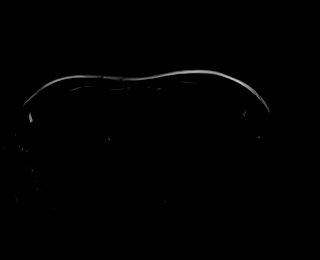

[Series 27: T1 dynamic · axial · 3.0mm · 1.25mm/px · z∈[-106,+155]mm · 3 of 88 slices shown (6 of 10)]
[im 1/88]
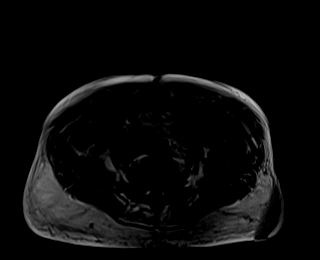
[im 44/88]
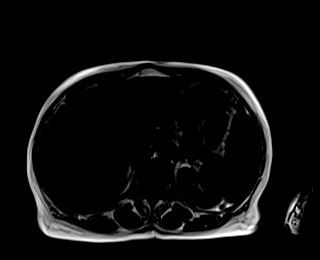
[im 88/88]
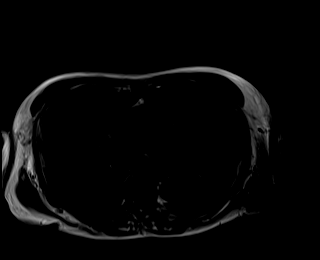

[Series 28: T1 dynamic · axial · 3.0mm · 1.25mm/px · z∈[-106,+155]mm · 3 of 88 slices shown (7 of 10)]
[im 1/88]
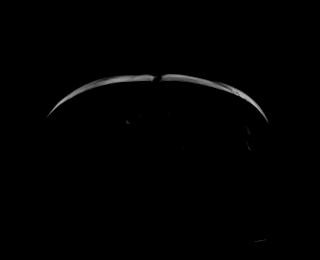
[im 44/88]
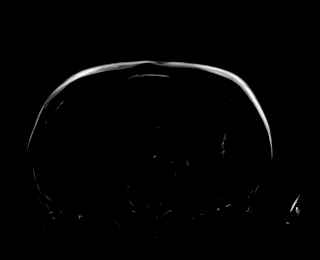
[im 88/88]
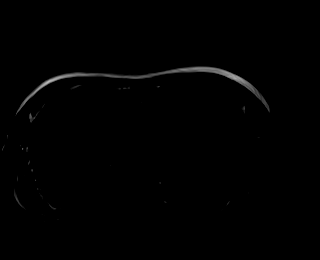

[Series 30: T1 dynamic · coronal · 3.0mm · 1.41mm/px · 3 of 80 slices shown (8 of 10)]
[im 1/80]
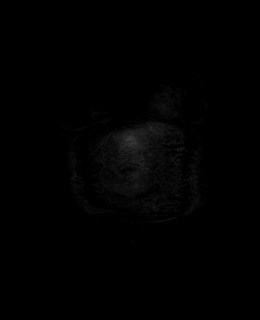
[im 40/80]
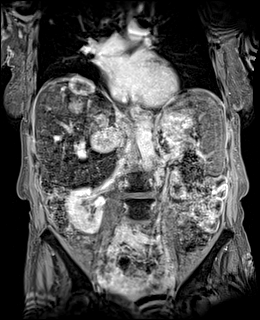
[im 80/80]
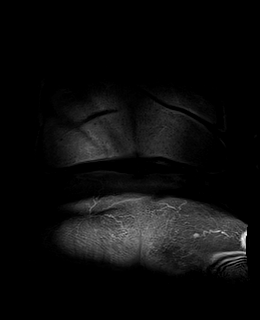

[Series 31: T2 · axial · 6.0mm · 1.56mm/px · 1 of 36 slices shown (2 of 2)]
[im 1/36]
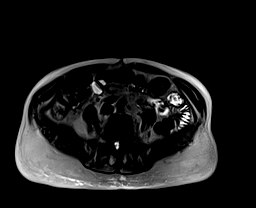

[Series 34: T1 dynamic · axial · 3.0mm · 1.25mm/px · z∈[-106,+155]mm · 3 of 88 slices shown (9 of 10)]
[im 1/88]
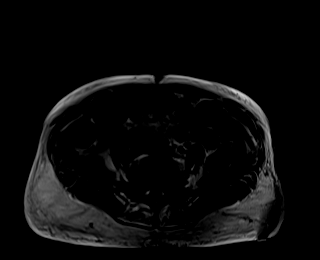
[im 44/88]
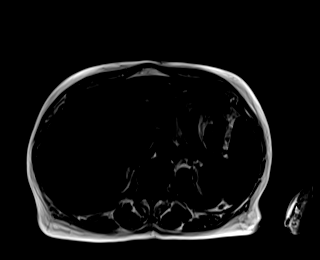
[im 88/88]
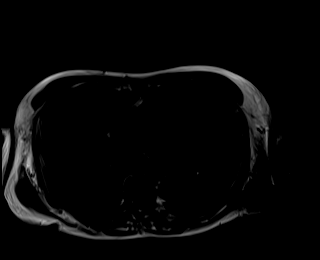

[Series 35: T1 dynamic · axial · 3.0mm · 1.25mm/px · z∈[-106,+155]mm · 3 of 87 slices shown (10 of 10)]
[im 1/87]
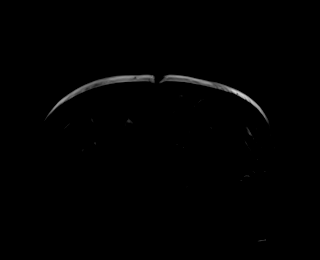
[im 44/87]
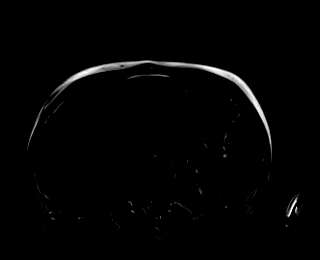
[im 87/87]
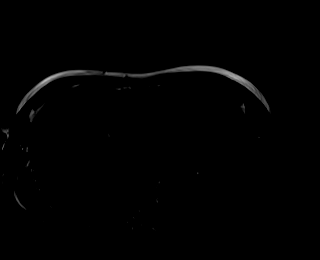

[48 of 48 positions shown; findings below may reference images not displayed]

FINDINGS: Lower chest: Unremarkable.

Hepatobiliary: Marked loss of signal intensity in the liver and
spleen on inphase T1 imaging is compatible with iron deposition
disease.

Multiple hepatic metastases again noted. Measuring on axial T2
imaging today, medial right hepatic lobe lesion measures 4.7 cm on
[DATE] which compares to 3.8 cm when remeasured in a similar fashion
on the prior study.

Immediately adjacent more posterior lesion measures 4.1 cm on [DATE],
markedly increased from 1.9 cm remeasured on the prior study.

3.7 cm lesion in the dome of the liver today (image 7/series 8) was
2.0 cm when remeasured in a similar fashion on the prior study.

Final index lesion measured today inferior right liver at 3.9 cm
([DATE]) was 2.9 cm previously.

Lesions towards the porta hepatis generate mass-effect and there is
similar intrahepatic biliary duct dilatation on today's exam.

Pancreas: No focal mass lesion. No dilatation of the main duct. No
intraparenchymal cyst. No peripancreatic edema.

Spleen: Loss of signal intensity compatible with iron deposition
disease.

Adrenals/Urinary Tract: No adrenal nodule or mass. No hydronephrosis
in either kidney.

Stomach/Bowel: Stomach is unremarkable. No gastric wall thickening.
No evidence of outlet obstruction. Duodenum is normally positioned
as is the ligament of Treitz. No small bowel or colonic dilatation
within the visualized abdomen.

Vascular/Lymphatic: No abdominal aortic aneurysm mild
lymphadenopathy in the hepatoduodenal ligament.

Other:  Trace ascites.

Musculoskeletal: No definite bony metastatic involvement although
assessment limited on postcontrast imaging.
IMPRESSION: 1. Postcontrast imaging limited by poor fat suppression and no
discernible enhancement.
2. Within the above-stated limitation, there has been clear
progression of the multiple hepatic metastases.
3. Stable appearance of the substantial intrahepatic biliary duct
dilatation.
4. Diffuse loss of signal intensity in the liver and spleen
compatible with iron deposition disease.

## 2021-09-24 MED ORDER — GADOBUTROL 1 MMOL/ML IV SOLN
7.0000 mL | Freq: Once | INTRAVENOUS | Status: AC | PRN
  Administered 2021-09-24: 7 mL via INTRAVENOUS

## 2021-09-26 ENCOUNTER — Other Ambulatory Visit: Payer: Self-pay | Admitting: *Deleted

## 2021-09-26 ENCOUNTER — Encounter: Payer: Self-pay | Admitting: *Deleted

## 2021-09-26 DIAGNOSIS — C23 Malignant neoplasm of gallbladder: Secondary | ICD-10-CM

## 2021-09-26 MED ORDER — POTASSIUM CHLORIDE CRYS ER 20 MEQ PO TBCR: 20.0000 meq | EXTENDED_RELEASE_TABLET | Freq: Two times a day (BID) | ORAL | 2 refills | Status: AC

## 2021-09-26 NOTE — Progress Notes (Signed)
MRI reviewed. Shows signs of progression. She will be in the office Wednesday to discuss results.   [name], patient's daughter, called with request for potassium refill. Patient has been taking it BID but the prescription still states daily. New prescription sent.   Oncology Nurse Navigator Documentation  Oncology Nurse Navigator Flowsheets 09/26/2021  Abnormal Finding Date -  Planned Course of Treatment -  Phase of Treatment -  Chemotherapy Actual Start Date: -  Navigator Follow Up Date: 09/28/2021  Navigator Follow Up Reason: Follow-up Appointment;Chemotherapy  Navigator Location [facility]  Referral Date to RadOnc/MedOnc -  Navigator Encounter Type Scan Review;Telephone  Telephone Medication Assistance;Incoming Call  Treatment Initiated Date -  Patient Visit Type MedOnc  Treatment Phase Active Tx  Barriers/Navigation Needs Coordination of Care;Education  Education -  Interventions Medication Assistance  Acuity Level 2-Minimal Needs (1-2 Barriers Identified)  Referrals -  Coordination of Care -  Education Method -  Support Groups/Services Friends and Family  Time Spent with Patient 15

## 2021-09-28 ENCOUNTER — Encounter: Payer: Self-pay | Admitting: *Deleted

## 2021-09-28 ENCOUNTER — Encounter: Payer: Self-pay | Admitting: Hematology & Oncology

## 2021-09-28 ENCOUNTER — Inpatient Hospital Stay: Payer: Medicare PPO | Attending: Hematology & Oncology

## 2021-09-28 ENCOUNTER — Ambulatory Visit: Payer: Medicare PPO

## 2021-09-28 ENCOUNTER — Inpatient Hospital Stay: Payer: Medicare PPO | Admitting: Hematology & Oncology

## 2021-09-28 ENCOUNTER — Other Ambulatory Visit: Payer: Self-pay

## 2021-09-28 ENCOUNTER — Inpatient Hospital Stay: Payer: Medicare PPO

## 2021-09-28 VITALS — BP 161/73 | HR 70 | Temp 97.9°F | Resp 17 | Wt 156.0 lb

## 2021-09-28 DIAGNOSIS — D509 Iron deficiency anemia, unspecified: Secondary | ICD-10-CM | POA: Diagnosis not present

## 2021-09-28 DIAGNOSIS — M7989 Other specified soft tissue disorders: Secondary | ICD-10-CM | POA: Diagnosis not present

## 2021-09-28 DIAGNOSIS — Z79899 Other long term (current) drug therapy: Secondary | ICD-10-CM | POA: Diagnosis not present

## 2021-09-28 DIAGNOSIS — C23 Malignant neoplasm of gallbladder: Secondary | ICD-10-CM | POA: Insufficient documentation

## 2021-09-28 DIAGNOSIS — C787 Secondary malignant neoplasm of liver and intrahepatic bile duct: Secondary | ICD-10-CM | POA: Insufficient documentation

## 2021-09-28 LAB — CBC WITH DIFFERENTIAL (CANCER CENTER ONLY)
Abs Immature Granulocytes: 0.01 10*3/uL (ref 0.00–0.07)
Basophils Absolute: 0 10*3/uL (ref 0.0–0.1)
Basophils Relative: 0 %
Eosinophils Absolute: 0 10*3/uL (ref 0.0–0.5)
Eosinophils Relative: 0 %
HCT: 46 % (ref 36.0–46.0)
Hemoglobin: 14.6 g/dL (ref 12.0–15.0)
Immature Granulocytes: 0 %
Lymphocytes Relative: 20 %
Lymphs Abs: 0.5 10*3/uL — ABNORMAL LOW (ref 0.7–4.0)
MCH: 31.8 pg (ref 26.0–34.0)
MCHC: 31.7 g/dL (ref 30.0–36.0)
MCV: 100.2 fL — ABNORMAL HIGH (ref 80.0–100.0)
Monocytes Absolute: 0.5 10*3/uL (ref 0.1–1.0)
Monocytes Relative: 19 %
Neutro Abs: 1.5 10*3/uL — ABNORMAL LOW (ref 1.7–7.7)
Neutrophils Relative %: 61 %
Platelet Count: 94 10*3/uL — ABNORMAL LOW (ref 150–400)
RBC: 4.59 MIL/uL (ref 3.87–5.11)
RDW: 16.8 % — ABNORMAL HIGH (ref 11.5–15.5)
WBC Count: 2.6 10*3/uL — ABNORMAL LOW (ref 4.0–10.5)
nRBC: 0 % (ref 0.0–0.2)

## 2021-09-28 LAB — CMP (CANCER CENTER ONLY)
ALT: 12 U/L (ref 0–44)
AST: 25 U/L (ref 15–41)
Albumin: 3.8 g/dL (ref 3.5–5.0)
Alkaline Phosphatase: 189 U/L — ABNORMAL HIGH (ref 38–126)
Anion gap: 10 (ref 5–15)
BUN: 22 mg/dL (ref 8–23)
CO2: 29 mmol/L (ref 22–32)
Calcium: 9.7 mg/dL (ref 8.9–10.3)
Chloride: 102 mmol/L (ref 98–111)
Creatinine: 1.27 mg/dL — ABNORMAL HIGH (ref 0.44–1.00)
GFR, Estimated: 42 mL/min — ABNORMAL LOW (ref >60)
Glucose, Bld: 165 mg/dL — ABNORMAL HIGH (ref 70–99)
Potassium: 3.5 mmol/L (ref 3.5–5.1)
Sodium: 141 mmol/L (ref 135–145)
Total Bilirubin: 0.5 mg/dL (ref 0.3–1.2)
Total Protein: 6.6 g/dL (ref 6.5–8.1)

## 2021-09-28 LAB — TSH: TSH: 1.532 u[IU]/mL (ref 0.308–3.960)

## 2021-09-28 LAB — IRON AND IRON BINDING CAPACITY (CC-WL,HP ONLY)
Iron: 32 ug/dL (ref 28–170)
Saturation Ratios: 12 % (ref 10.4–31.8)
TIBC: 276 ug/dL (ref 250–450)
UIBC: 244 ug/dL (ref 148–442)

## 2021-09-28 LAB — FERRITIN: Ferritin: 1705 ng/mL — ABNORMAL HIGH (ref 11–307)

## 2021-09-28 NOTE — Progress Notes (Signed)
Recent scan shows progression. Current regimen will be discontinued. Patient will need biopsy to attempt molecular testing.   Will follow for biopsy scheduling.   Oncology Nurse Navigator Documentation  Oncology Nurse Navigator Flowsheets 09/28/2021  Abnormal Finding Date -  Planned Course of Treatment -  Phase of Treatment -  Chemotherapy Actual Start Date: -  Navigator Follow Up Date: 09/30/2021  Navigator Follow Up Reason: Appointment Review  Navigator Location [facility]  Referral Date to RadOnc/MedOnc -  Navigator Encounter Type Follow-up Appt;Appt/Treatment Plan Review  Telephone -  Treatment Initiated Date -  Patient Visit Type MedOnc  Treatment Phase Active Tx  Barriers/Navigation Needs Coordination of Care;Education  Education -  Interventions Coordination of Care  Acuity Level 2-Minimal Needs (1-2 Barriers Identified)  Referrals -  Coordination of Care Other  Education Method -  Support Groups/Services Friends and Family  Time Spent with Patient 30

## 2021-09-28 NOTE — Progress Notes (Signed)
Hematology and Oncology Follow Up Visit  Vanessa Garrett [id number] 02/23/1941 81 y.o. 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  Unfortunately, she does have progressive disease.  We did do an MRI of her liver last week.  The MRI did show there was progression.  I think that were going to have to get a biopsy.  I think with a biopsy, we can see if there is any change in her molecular markers.  The last biopsy that was done was back in 2021 when she was initially was diagnosed up in [place].  I talked to she and her son-in-law at length.  Again I explained to them why I thought that it was important to get a biopsy.  We really need to see if there is any actionable mutations that we can use targeted therapy for.  She feels well.  She is doing incredibly well with the Retacrit at home.  Her hemoglobin is 14.6.  I told her daughter, who is on the cell phone to just do once a week Retacrit.  She has had no problems with pain.  There is no problems with nausea or vomiting.  She has chronic leg swelling.  She is on diuretics.  She has had no issues with headaches.  She has had no problems with cough or shortness of breath.  Her CA 19-9 in early January was 866.  Overall, I would say performance status is probably ECOG 2.     Medications:  Allergies as of 09/28/2021       Reactions   Megace Es [megestrol Acetate] Swelling   Facial swelling        Medication  List        Accurate as of September 28, 2021 10:03 AM. If you have any questions, ask your nurse or doctor.          dexamethasone 4 MG tablet Commonly known as: DECADRON Take 2 tablets (8 mg total) by mouth daily. Start the day after chemotherapy for 2 days. Take with food.   dronabinol 2.5 MG capsule Commonly known as: MARINOL Take 1 capsule (2.5 mg total) by mouth 2 (two) times daily before a meal.   furosemide 40 MG tablet Commonly known as: LASIX Take 40 mg by mouth daily.   Generlac 10 GM/15ML Soln Generic drug: lactulose (encephalopathy) Take 10 g by mouth daily as needed (constipation).   lidocaine-prilocaine cream Commonly known as: EMLA Apply to affected area once   lidocaine-prilocaine cream Commonly known as: EMLA Apply to affected area once   LORazepam 0.5 MG tablet Commonly known as: Ativan Take 1 tablet (0.5 mg total) by mouth every 6 (six) hours as needed (Nausea or vomiting).   metoprolol tartrate 25 MG tablet Commonly known as: LOPRESSOR TAKE 1 TABLET(25 MG) BY MOUTH TWICE DAILY   NON FORMULARY Take 2 tablets by mouth daily. Mega Food - Blood Builder   NON FORMULARY 1.25  mg every other day. Beet Root Powder   ondansetron 8 MG tablet Commonly known as: Zofran Take 1 tablet (8 mg total) by mouth 2 (two) times daily as needed for refractory nausea / vomiting. Start on day 3 after chemotherapy.   potassium chloride SA 20 MEQ tablet Commonly known as: KLOR-CON M Take 1 tablet (20 mEq total) by mouth 2 (two) times daily.   prochlorperazine 10 MG tablet Commonly known as: COMPAZINE Take 1 tablet (10 mg total) by mouth every 6 (six) hours as needed (Nausea or vomiting).   Retacrit 10000 UNIT/ML injection Generic drug: epoetin alfa-epbx INJECT 1 ML (10,000 UNITS TOTAL) TWICE A WEEK   S.S.S. TONIC PO Take 45 mLs by mouth every other day.   triamterene-hydrochlorothiazide 75-50 MG tablet Commonly known as: MAXZIDE Take 1 tablet by mouth  daily.   vitamin B-12 100 MCG tablet Commonly known as: CYANOCOBALAMIN Take 100 mcg by mouth in the morning and at bedtime.   vitamin D3 50 MCG (2000 UT) Caps Take by mouth daily.        Allergies:  Allergies  Allergen Reactions   Megace Es [Megestrol Acetate] Swelling    Facial swelling    Past Medical History, Surgical history, Social history, and Family History were reviewed and updated.  Review of Systems: Review of Systems  Constitutional: Negative.   HENT: Negative.    Eyes: Negative.   Respiratory: Negative.    Cardiovascular:  Positive for leg swelling.  Gastrointestinal: Negative.   Genitourinary: Negative.   Musculoskeletal: Negative.   Skin: Negative.   Neurological: Negative.   Endo/Heme/Allergies: Negative.   Psychiatric/Behavioral: Negative.      Physical Exam:  weight is 156 lb (70.8 kg). Her oral temperature is 97.9 F (36.6 C). Her blood pressure is 161/73 (abnormal) and her pulse is 70. Her respiration is 17 and oxygen saturation is 99%.   Wt Readings from Last 3 Encounters:  09/28/21 156 lb (70.8 kg)  09/06/21 151 lb (68.5 kg)  08/15/21 150 lb 6.4 oz (68.2 kg)   Physical Exam Vitals reviewed.  HENT:     Head: Normocephalic and atraumatic.  Eyes:     Pupils: Pupils are equal, round, and reactive to light.  Cardiovascular:     Rate and Rhythm: Normal rate and regular rhythm.     Heart sounds: Normal heart sounds.  Pulmonary:     Effort: Pulmonary effort is normal.     Breath sounds: Normal breath sounds.  Abdominal:     General: Bowel sounds are normal.     Palpations: Abdomen is soft.  Musculoskeletal:        General: No tenderness or deformity. Normal range of motion.     Cervical back: Normal range of motion.     Comments: Extremities shows some swelling in the lower extremities.  She has more swelling in the left leg than on the right leg.  This is about 2+ pitting in the left leg.  Lymphadenopathy:     Cervical: No cervical  adenopathy.  Skin:    General: Skin is warm and dry.     Findings: No erythema or rash.  Neurological:     Mental Status: She is alert and oriented to person, place, and time.  Psychiatric:        Behavior: Behavior normal.        Thought Content: Thought content normal.        Judgment: Judgment normal.      Lab Results  Component Value  Date   WBC 2.6 (L) 09/28/2021   HGB 14.6 09/28/2021   HCT 46.0 09/28/2021   MCV 100.2 (H) 09/28/2021   PLT 94 (L) 09/28/2021   Lab Results  Component Value Date   FERRITIN 1,363 (H) 08/08/2021   IRON 33 (L) 08/08/2021   TIBC 220 (L) 08/08/2021   UIBC 187 08/08/2021   IRONPCTSAT 15 (L) 08/08/2021   Lab Results  Component Value Date   RETICCTPCT 2.2 08/08/2021   RBC 4.59 09/28/2021   No results found for: KPAFRELGTCHN, LAMBDASER, KAPLAMBRATIO No results found for: IGGSERUM, IGA, IGMSERUM No results found for: Odetta Pink, SPEI   Chemistry      Component Value Date/Time   NA 141 09/28/2021 0852   K 3.5 09/28/2021 0852   CL 102 09/28/2021 0852   CO2 29 09/28/2021 0852   BUN 22 09/28/2021 0852   CREATININE 1.27 (H) 09/28/2021 0852      Component Value Date/Time   CALCIUM 9.7 09/28/2021 0852   ALKPHOS 189 (H) 09/28/2021 0852   AST 25 09/28/2021 0852   ALT 12 09/28/2021 0852   BILITOT 0.5 09/28/2021 0852       Impression and Plan: Ms. Defranco is a very pleasant 81 yo African American female with stage IV adenocarcinoma of the gallbladder.   We will see about getting a biopsy.  I will set this up for next week.  We will then see about getting molecular studies.  If we cannot use targeted therapy, that the next option would be liposomal irinotecan with 5-FU.  She still has a very good performance status.  As such, I think that we can still have some flexibility with our protocols.  Again, quality of life is what is important.  She is always wanted her quality of life to be  as good as possible.  Once we get the molecular studies back, then we will plan to get her back to see [place].   Volanda Napoleon, MD 2/1/202310:03 AM

## 2021-09-29 LAB — CANCER ANTIGEN 19-9: CA 19-9: 946 U/mL — ABNORMAL HIGH (ref 0–35)

## 2021-09-29 LAB — T4: T4, Total: 10.1 ug/dL (ref 4.5–12.0)

## 2021-09-30 ENCOUNTER — Inpatient Hospital Stay: Payer: Medicare PPO

## 2021-09-30 ENCOUNTER — Encounter: Payer: Self-pay | Admitting: *Deleted

## 2021-09-30 NOTE — Progress Notes (Signed)
..  Patient Assist/Replace for the following has been terminated. Medication: Imfinzi Reason for Termination: Due to disease progression, medication was discontinued 09/28/2021. Last DOS: 09/06/2021.  Marland KitchenJuan Quam, CPhT IV Drug Replacement Specialist [place] Phone: [phone]

## 2021-09-30 NOTE — Progress Notes (Signed)
Patient's biopsy has not yet been scheduled but is still under review. Will follow up next week for scheduling.   Oncology Nurse Navigator Documentation  Oncology Nurse Navigator Flowsheets 09/30/2021  Abnormal Finding Date -  Planned Course of Treatment -  Phase of Treatment -  Chemotherapy Actual Start Date: -  Navigator Follow Up Date: 10/04/2021  Navigator Follow Up Reason: Appointment Review  Navigator Location [facility]  Referral Date to RadOnc/MedOnc -  Navigator Encounter Type Appt/Treatment Plan Review  Telephone -  Treatment Initiated Date -  Patient Visit Type MedOnc  Treatment Phase Active Tx  Barriers/Navigation Needs Coordination of Care;Education  Education -  Interventions Other  Acuity Level 2-Minimal Needs (1-2 Barriers Identified)  Referrals -  Coordination of Care Radiology  Education Method -  Support Groups/Services Friends and Family  Time Spent with Patient 15

## 2021-10-04 ENCOUNTER — Other Ambulatory Visit: Payer: Self-pay | Admitting: *Deleted

## 2021-10-04 MED ORDER — METOPROLOL TARTRATE 25 MG PO TABS: ORAL_TABLET | ORAL | 2 refills | Status: DC

## 2021-10-06 ENCOUNTER — Encounter: Payer: Self-pay | Admitting: *Deleted

## 2021-10-06 ENCOUNTER — Encounter (HOSPITAL_COMMUNITY): Payer: Self-pay

## 2021-10-06 NOTE — Progress Notes (Signed)
Patient is still not scheduled for biopsy. Still listed as pending dated 09/29/2021. Reached out to the scheduler and she has resubmitted the request today. Will follow up tomorrow to see if biopsy is scheduled.   Oncology Nurse Navigator Documentation  Oncology Nurse Navigator Flowsheets 10/06/2021  Abnormal Finding Date -  Planned Course of Treatment -  Phase of Treatment -  Chemotherapy Actual Start Date: -  Navigator Follow Up Date: 10/07/2021  Navigator Follow Up Reason: Appointment Review  Navigator Location [facility]  Referral Date to RadOnc/MedOnc -  Navigator Encounter Type Appt/Treatment Plan Review  Telephone -  Treatment Initiated Date -  Patient Visit Type MedOnc  Treatment Phase Active Tx  Barriers/Navigation Needs Coordination of Care;Education  Education -  Interventions Coordination of Care  Acuity Level 2-Minimal Needs (1-2 Barriers Identified)  Referrals -  Coordination of Care Radiology  Education Method -  Support Groups/Services Friends and Family  Time Spent with Patient 30

## 2021-10-06 NOTE — Progress Notes (Signed)
Vanessa Garrett, Vanessa Garrett Legal Sex  Female DOB  12/15/1940 SSN  [id number] Address  [address]  [place] [address] Phone  [phone] (Home) *[phone] (Mobile)    RE: US BIOPSY (LIVER) Received: Today Corrie Mckusick, DO  Valli Glance OK for US guided liver mass biopsy.   [name]        Previous Messages   ----- Message -----  From: Valli Glance  Sent: 10/06/2021  10:52 AM EST  To: Ir Procedure Requests  Subject: FW: US BIOPSY (LIVER)                           Still pending review    ----- Message -----  From: Valli Glance  Sent: 09/29/2021   4:41 PM EST  To: Ir Procedure Requests  Subject: US BIOPSY (LIVER)                               US BIOPSY (LIVER)        Reason:Progressive Gallbladder Cancer         History: MRI and [place] In computer        Provider: Volanda Napoleon

## 2021-10-11 ENCOUNTER — Other Ambulatory Visit: Payer: Self-pay | Admitting: *Deleted

## 2021-10-11 ENCOUNTER — Encounter: Payer: Self-pay | Admitting: *Deleted

## 2021-10-11 MED ORDER — DRONABINOL 2.5 MG PO CAPS: 2.5000 mg | ORAL_CAPSULE | Freq: Two times a day (BID) | ORAL | 0 refills | Status: AC

## 2021-10-11 NOTE — Progress Notes (Signed)
Per scheduling, patient has not called back regarding scheduling their biopsy. Called and spoke to Vanessa Garrett, and gave her call back number. She will call to schedule.   Oncology Nurse Navigator Documentation  Oncology Nurse Navigator Flowsheets 10/11/2021  Abnormal Finding Date -  Planned Course of Treatment -  Phase of Treatment -  Chemotherapy Actual Start Date: -  Navigator Follow Up Date: 10/19/2021  Navigator Follow Up Reason: Surgery  Navigator Location [facility]  Referral Date to RadOnc/MedOnc -  Navigator Encounter Type Telephone  Telephone Outgoing Call  Treatment Initiated Date -  Patient Visit Type MedOnc  Treatment Phase Active Tx  Barriers/Navigation Needs Coordination of Care;Education  Education Other  Interventions Coordination of Care  Acuity Level 2-Minimal Needs (1-2 Barriers Identified)  Referrals -  Coordination of Care Radiology  Education Method Verbal  Support Groups/Services Friends and Family  Time Spent with Patient 15

## 2021-10-18 ENCOUNTER — Other Ambulatory Visit: Payer: Self-pay | Admitting: Student

## 2021-10-19 ENCOUNTER — Other Ambulatory Visit: Payer: Self-pay

## 2021-10-19 ENCOUNTER — Ambulatory Visit (HOSPITAL_COMMUNITY)
Admission: RE | Admit: 2021-10-19 | Discharge: 2021-10-19 | Disposition: A | Discharge status: Home / Self Care | Payer: Medicare PPO | Source: Ambulatory Visit | Attending: Hematology & Oncology | Admitting: Hematology & Oncology

## 2021-10-19 ENCOUNTER — Encounter: Payer: Self-pay | Admitting: *Deleted

## 2021-10-19 DIAGNOSIS — C23 Malignant neoplasm of gallbladder: Secondary | ICD-10-CM | POA: Diagnosis not present

## 2021-10-19 DIAGNOSIS — C787 Secondary malignant neoplasm of liver and intrahepatic bile duct: Secondary | ICD-10-CM | POA: Insufficient documentation

## 2021-10-19 LAB — CBC
HCT: 46.4 % — ABNORMAL HIGH (ref 36.0–46.0)
Hemoglobin: 15.3 g/dL — ABNORMAL HIGH (ref 12.0–15.0)
MCH: 31.9 pg (ref 26.0–34.0)
MCHC: 33 g/dL (ref 30.0–36.0)
MCV: 96.9 fL (ref 80.0–100.0)
Platelets: 151 10*3/uL (ref 150–400)
RBC: 4.79 MIL/uL (ref 3.87–5.11)
RDW: 16.1 % — ABNORMAL HIGH (ref 11.5–15.5)
WBC: 4.3 10*3/uL (ref 4.0–10.5)
nRBC: 0 % (ref 0.0–0.2)

## 2021-10-19 LAB — APTT: aPTT: 29 seconds (ref 24–36)

## 2021-10-19 LAB — PROTIME-INR
INR: 1.1 (ref 0.8–1.2)
Prothrombin Time: 13.9 seconds (ref 11.4–15.2)

## 2021-10-19 MED ORDER — FENTANYL CITRATE (PF) 100 MCG/2ML IJ SOLN
INTRAMUSCULAR | Status: AC | PRN
Start: 2021-10-19 — End: 2021-10-19
  Administered 2021-10-19: 25 ug via INTRAVENOUS

## 2021-10-19 MED ORDER — FENTANYL CITRATE (PF) 100 MCG/2ML IJ SOLN
INTRAMUSCULAR | Status: AC
  Filled 2021-10-19: qty 2

## 2021-10-19 MED ORDER — MIDAZOLAM HCL 2 MG/2ML IJ SOLN
INTRAMUSCULAR | Status: AC | PRN
  Administered 2021-10-19 (×2): .5 mg via INTRAVENOUS

## 2021-10-19 MED ORDER — LIDOCAINE HCL (PF) 1 % IJ SOLN
INTRAMUSCULAR | Status: AC
  Filled 2021-10-19: qty 30

## 2021-10-19 MED ORDER — GELATIN ABSORBABLE 12-7 MM EX MISC
CUTANEOUS | Status: AC
  Filled 2021-10-19: qty 1

## 2021-10-19 MED ORDER — MIDAZOLAM HCL 2 MG/2ML IJ SOLN
INTRAMUSCULAR | Status: AC
  Filled 2021-10-19: qty 2

## 2021-10-19 MED ORDER — SODIUM CHLORIDE 0.9 % IV SOLN: INTRAVENOUS | Status: DC

## 2021-10-19 NOTE — H&P (Signed)
Chief Complaint: Hepatic metastasis  Referring Physician(s): Ennever  Supervising Physician: Mir, Sharen Heck  Patient Status: [facility] - Out-pt  History of Present Illness: Vanessa Garrett is a 80 y.o. female with known stage IV adenocarcinoma of the gallbladder.  MRI done 09/26/21 showed progression of the multiple hepatic metastases.  She is here today for image guided biopsy to evaluate for change in her molecular markers.  She is NPO. No nausea/vomiting. No Fever/chills. ROS negative.   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   IR IMAGING GUIDED PORT INSERTION  06/30/2020   UPPER GASTROINTESTINAL ENDOSCOPY      Allergies: Megace es [megestrol acetate]  Medications: Prior to Admission medications   Medication Sig Start Date End Date Taking? Authorizing Provider  Cholecalciferol (VITAMIN D3) 50 MCG (2000 UT) CAPS Take by mouth daily.   Yes [provider]  dronabinol (MARINOL) 2.5 MG capsule Take 1 capsule (2.5 mg total) by mouth 2 (two) times daily before a meal. 10/11/21  Yes Ennever, Rudell Cobb, MD  epoetin alfa-epbx (RETACRIT) 26948 UNIT/ML injection INJECT 1 ML (10,000 UNITS TOTAL) TWICE A WEEK 09/22/21  Yes Ennever, Rudell Cobb, MD  metoprolol tartrate (LOPRESSOR) 25 MG tablet TAKE 1 TABLET(25 MG) BY MOUTH TWICE DAILY 10/04/21  Yes Ennever, Rudell Cobb, MD  NON FORMULARY Take 2 tablets by mouth daily. Mega Food - Blood Builder   Yes [provider]  potassium chloride SA (KLOR-CON M) 20 MEQ tablet Take 1 tablet (20 mEq total) by mouth 2 (two) times daily. 09/26/21  Yes Ennever, Rudell Cobb, MD  dexamethasone (DECADRON) 4 MG tablet Take 2 tablets (8 mg total) by mouth daily. Start the day after chemotherapy for 2 days. Take with food. 07/18/21   Volanda Napoleon, MD  furosemide (LASIX) 40 MG tablet Take 40 mg by mouth daily. 05/25/21   [provider]  GENERLAC 10 GM/15ML SOLN Take 10 g by mouth daily as needed (constipation). 02/07/21   [provider]  Iron-Vitamins (S.S.S. TONIC PO) Take 45 mLs by mouth every other day.    [provider]  lidocaine-prilocaine (EMLA) cream Apply to affected area once 11/26/20   Volanda Napoleon, MD  lidocaine-prilocaine (EMLA) cream Apply to affected area once 07/18/21   Ennever, Rudell Cobb, MD  LORazepam (ATIVAN) 0.5 MG tablet Take 1 tablet (0.5 mg total) by mouth every 6 (six) hours as needed (Nausea or vomiting). Patient not taking: Reported on 09/06/2021 07/18/21   Volanda Napoleon, MD  NON FORMULARY 1.25 mg every other day. Beet Root Powder    [provider]  ondansetron (ZOFRAN) 8 MG tablet Take 1 tablet (8 mg total) by mouth 2 (two) times daily as needed for refractory nausea / vomiting. Start on day 3 after chemotherapy. Patient not taking: Reported on 09/06/2021 07/18/21   Volanda Napoleon, MD  prochlorperazine (COMPAZINE) 10 MG tablet  Take 1 tablet (10 mg total) by mouth every 6 (six) hours as needed (Nausea or vomiting). 07/18/21   Volanda Napoleon, MD  triamterene-hydrochlorothiazide (MAXZIDE) 75-50 MG tablet Take 1 tablet by mouth daily. 03/21/21   Volanda Napoleon, MD  vitamin B-12 (CYANOCOBALAMIN) 100 MCG tablet Take 100 mcg by mouth in the morning and at bedtime.    [provider]     Family History  Problem Relation Age of Onset   Cancer Maternal Aunt        not sure the type   Colon cancer Neg Hx    Esophageal cancer Neg Hx    Stomach  cancer Neg Hx    Rectal cancer Neg Hx     Social History   Socioeconomic History   Marital status: Divorced    Spouse name: Not on file   Number of children: Not on file   Years of education: Not on file   Highest education level: Not on file  Occupational History   Not on file  Tobacco Use   Smoking status: Former    Packs/day: 0.25    Years: 10.00    Pack years: 2.50    Types: Cigarettes    Quit date: 08/26/1969    Years since quitting: 52.1   Smokeless tobacco: Never  Vaping Use   Vaping Use: Never used  Substance and Sexual Activity   Alcohol use: Not Currently   Drug use: Never   Sexual activity: Not on file  Other Topics Concern   Not on file  Social History Narrative   Not on file   Social Determinants of Health   Financial Resource Strain: Not on file  Food Insecurity: Not on file  Transportation Needs: Not on file  Physical Activity: Not on file  Stress: Not on file  Social Connections: Not on file     Review of Systems: A 12 point ROS discussed and pertinent positives are indicated in the HPI above.  All other systems are negative.  Review of Systems  Vital Signs: BP (!) 144/92    Pulse 72    Temp 97.9 F (36.6 C) (Oral)    Resp 18    Ht 5\' 2"  (1.575 m)    Wt 150 lb (68 kg)    SpO2 94%    BMI 27.44 kg/m   Physical Exam Vitals reviewed.  HENT:     Head: Normocephalic and atraumatic.  Eyes:     Extraocular Movements: Extraocular movements intact.  Cardiovascular:     Rate and Rhythm: Normal rate and regular rhythm.  Pulmonary:     Effort: Pulmonary effort is normal. No respiratory distress.     Breath sounds: Normal breath sounds.  Abdominal:     Palpations: Abdomen is soft.  Musculoskeletal:        General: Normal range of motion.     Cervical back: Normal range of motion.  Skin:    General: Skin is warm and dry.  Neurological:     General: No focal deficit present.     Mental Status: She is alert and oriented to person, place, and  time.  Psychiatric:        Mood and Affect: Mood normal.        Behavior: Behavior normal.        Thought Content: Thought content normal.        Judgment: Judgment normal.    Imaging: MR LIVER W WO CONTRAST  Result Date: 09/26/2021 CLINICAL DATA:  Metastatic gallbladder cancer. EXAM: MRI ABDOMEN WITHOUT AND WITH CONTRAST TECHNIQUE: Multiplanar multisequence MR imaging of the abdomen was performed both before and after the administration of intravenous contrast. CONTRAST:  65mL GADAVIST GADOBUTROL 1 MMOL/ML IV SOLN COMPARISON:  06/16/2021 FINDINGS: Lower chest: Unremarkable. Hepatobiliary: Marked loss of signal intensity in the liver and spleen on inphase T1 imaging is compatible with iron deposition disease. Multiple hepatic metastases again noted. Measuring on axial T2 imaging today, medial right hepatic lobe lesion measures 4.7 cm on 19/8 which compares to 3.8 cm when remeasured in a similar fashion on the prior study. Immediately adjacent more posterior lesion measures 4.1 cm on 18/8, markedly increased from 1.9 cm remeasured on the prior study. 3.7 cm lesion in the dome of the liver today (image 7/series 8) was 2.0 cm when remeasured in a similar fashion on the prior study. Final index lesion measured today inferior right liver at 3.9 cm (26/8) was 2.9 cm previously. Lesions towards the porta hepatis generate mass-effect and there is similar intrahepatic biliary duct dilatation on today's exam. Pancreas: No focal mass lesion. No dilatation of the main duct. No intraparenchymal cyst. No peripancreatic edema. Spleen: Loss of signal intensity compatible with iron deposition disease. Adrenals/Urinary Tract: No adrenal nodule or mass. No hydronephrosis in either kidney. Stomach/Bowel: Stomach is unremarkable. No gastric wall thickening. No evidence of outlet obstruction. Duodenum is normally positioned as is the ligament of Treitz. No small bowel or colonic dilatation within the visualized abdomen.  Vascular/Lymphatic: No abdominal aortic aneurysm mild lymphadenopathy in the hepatoduodenal ligament. Other:  Trace ascites. Musculoskeletal: No definite bony metastatic involvement although assessment limited on postcontrast imaging. IMPRESSION: 1. Postcontrast imaging limited by poor fat suppression and no discernible enhancement. 2. Within the above-stated limitation, there has been clear progression of the multiple hepatic metastases. 3. Stable appearance of the substantial intrahepatic biliary duct dilatation. 4. Diffuse loss of signal intensity in the liver and spleen compatible with iron deposition disease. Electronically Signed   By: Misty Stanley M.D.   On: 09/26/2021 07:17    Labs:  CBC: Recent Labs    08/15/21 0824 09/06/21 0827 09/28/21 0852 10/19/21 1129  WBC 3.0* 2.4* 2.6* 4.3  HGB 12.9 13.7 14.6 15.3*  HCT 40.7 43.1 46.0 46.4*  PLT 101* 98* 94* 151    COAGS: No results for input(s): INR, APTT in the last 8760 hours.  BMP: Recent Labs    08/08/21 0845 08/15/21 0824 09/06/21 0827 09/28/21 0852  NA 136 137 137 141  K 3.3* 3.2* 3.1* 3.5  CL 99 99 96* 102  CO2 31 32 34* 29  GLUCOSE 162* 158* 144* 165*  BUN 24* 25* 15 22  CALCIUM 9.4 8.8* 9.3 9.7  CREATININE 1.30* 1.26* 1.17* 1.27*  GFRNONAA 42* 43* 47* 42*    LIVER FUNCTION TESTS: Recent Labs    08/08/21 0845 08/15/21 0824 09/06/21 0827 09/28/21 0852  BILITOT 0.4 0.5 0.5 0.5  AST 22 21 24 25   ALT 12 12 12 12   ALKPHOS 155* 148* 175* 189*  PROT 5.8* 6.0* 6.0* 6.6  ALBUMIN 3.6 3.4* 3.7 3.8    TUMOR MARKERS: No results for input(s): AFPTM, CEA, CA199, CHROMGRNA in the last 8760 hours.  Assessment and Plan:  Stage IV adenocarcinoma of the gallbladder.  MRI done 09/26/21 showed progression of the multiple hepatic metastases.  Will proceed with image guided biopsy today by Dr. Dwaine Gale.  Risks and benefits of biopsy of liver lesion was discussed with the patient and/or patient's family  including, but not  limited to bleeding, infection, damage to adjacent structures or low yield requiring additional tests.  All of the questions were answered and there is agreement to proceed.  Consent signed and in chart.   Thank you for allowing our service to participate in Vanessa Garrett 's care.  Electronically Signed: Murrell Redden, PA-C   10/19/2021, 12:24 PM      I spent a total of  15 Minutes   in face to face in clinical consultation, greater than 50% of which was counseling/coordinating care for liver lesion biopsy.

## 2021-10-19 NOTE — Procedures (Signed)
Interventional Radiology Procedure Note  Procedure: US guided biopsy of right liver lesion  Indication: Multiple liver lesions  Findings: Please refer to procedural dictation for full description.  Complications: None  EBL: < 10 mL  Miachel Roux, MD [phone]

## 2021-10-19 NOTE — Progress Notes (Signed)
Patient had liver biopsy today. Will follow for path and send for molecular testing.   Oncology Nurse Navigator Documentation  Oncology Nurse Navigator Flowsheets 10/19/2021  Abnormal Finding Date -  Planned Course of Treatment -  Phase of Treatment -  Chemotherapy Actual Start Date: -  Navigator Follow Up Date: 10/21/2021  Navigator Follow Up Reason: Pathology  Navigator Location [facility]  Referral Date to RadOnc/MedOnc -  Navigator Encounter Type Appt/Treatment Plan Review  Telephone -  Treatment Initiated Date -  Patient Visit Type MedOnc  Treatment Phase Active Tx  Barriers/Navigation Needs Coordination of Care;Education  Education -  Interventions None Required  Acuity Level 2-Minimal Needs (1-2 Barriers Identified)  Referrals -  Coordination of Care -  Education Method -  Support Groups/Services Friends and Family  Time Spent with Patient 15

## 2021-10-20 LAB — SURGICAL PATHOLOGY

## 2021-10-21 ENCOUNTER — Encounter: Payer: Self-pay | Admitting: *Deleted

## 2021-10-21 NOTE — Progress Notes (Signed)
Per Dr Marin Olp, request for Foundation One testing sent on specimen [id number] DOS 10/19/2021.  Dr Marin Olp would like to bring patient in for follow up once results of Foundation One received.   Oncology Nurse Navigator Documentation  Oncology Nurse Navigator Flowsheets 10/21/2021  Abnormal Finding Date -  Planned Course of Treatment -  Phase of Treatment -  Chemotherapy Actual Start Date: -  Navigator Follow Up Date: 10/28/2021  Navigator Follow Up Reason: Pathology  Navigator Location [facility]  Referral Date to RadOnc/MedOnc -  Navigator Encounter Type Molecular Studies;Pathology Review  Telephone -  Treatment Initiated Date -  Patient Visit Type MedOnc  Treatment Phase Active Tx  Barriers/Navigation Needs Coordination of Care;Education  Education -  Interventions Coordination of Care  Acuity Level 2-Minimal Needs (1-2 Barriers Identified)  Referrals -  Coordination of Care Pathology  Education Method -  Support Groups/Services Friends and Family  Time Spent with Patient 30

## 2021-10-28 ENCOUNTER — Encounter: Payer: Self-pay | Admitting: *Deleted

## 2021-10-28 ENCOUNTER — Telehealth: Payer: Self-pay | Admitting: Hematology & Oncology

## 2021-10-28 NOTE — Progress Notes (Signed)
Checked status of Foundation One testing. Specimen was delayed in getting to lab, but they do have it now. ETA on results is March 16th.  ? ?Patient has no current follow up with the office. Dr Marin Olp updated to the above. We will go ahead and schedule follow up appointment for discussion of results.  ? ?Oncology Nurse Navigator Documentation ? ?Oncology Nurse Navigator Flowsheets 10/28/2021  ?Abnormal Finding Date -  ?Planned Course of Treatment -  ?Phase of Treatment -  ?Chemotherapy Actual Start Date: -  ?Navigator Follow Up Date: 11/11/2021  ?Navigator Follow Up Reason: Follow-up Appointment  ?Navigator Location [facility]  ?Referral Date to RadOnc/MedOnc -  ?Navigator Encounter Type Pathology Review  ?Telephone -  ?Treatment Initiated Date -  ?Patient Visit Type MedOnc  ?Treatment Phase Active Tx  ?Barriers/Navigation Needs Coordination of Care;Education  ?Education -  ?Interventions Coordination of Care  ?Acuity Level 2-Minimal Needs (1-2 Barriers Identified)  ?Referrals -  ?Coordination of Care Appts  ?Education Method -  ?Support Groups/Services Friends and Family  ?Time Spent with Patient 15  ?  ?

## 2021-10-28 NOTE — Telephone Encounter (Signed)
Called to schedule follow up with Dr. Marin Olp , per 3/3 sch msg , left voicemail  ?

## 2021-10-31 ENCOUNTER — Telehealth: Payer: Self-pay | Admitting: Hematology & Oncology

## 2021-11-01 ENCOUNTER — Encounter: Payer: Self-pay | Admitting: Hematology & Oncology

## 2021-11-11 ENCOUNTER — Inpatient Hospital Stay: Payer: Medicare PPO | Attending: Hematology & Oncology

## 2021-11-11 ENCOUNTER — Encounter: Payer: Self-pay | Admitting: Hematology & Oncology

## 2021-11-11 ENCOUNTER — Other Ambulatory Visit: Payer: Self-pay

## 2021-11-11 ENCOUNTER — Inpatient Hospital Stay: Payer: Medicare PPO | Admitting: Hematology & Oncology

## 2021-11-11 ENCOUNTER — Inpatient Hospital Stay: Payer: Medicare PPO

## 2021-11-11 ENCOUNTER — Encounter: Payer: Self-pay | Admitting: *Deleted

## 2021-11-11 VITALS — BP 125/77 | HR 83 | Temp 98.3°F | Resp 17 | Ht 64.96 in

## 2021-11-11 DIAGNOSIS — Z79899 Other long term (current) drug therapy: Secondary | ICD-10-CM | POA: Insufficient documentation

## 2021-11-11 DIAGNOSIS — C23 Malignant neoplasm of gallbladder: Secondary | ICD-10-CM | POA: Diagnosis present

## 2021-11-11 DIAGNOSIS — D500 Iron deficiency anemia secondary to blood loss (chronic): Secondary | ICD-10-CM | POA: Insufficient documentation

## 2021-11-11 DIAGNOSIS — R110 Nausea: Secondary | ICD-10-CM | POA: Insufficient documentation

## 2021-11-11 DIAGNOSIS — C787 Secondary malignant neoplasm of liver and intrahepatic bile duct: Secondary | ICD-10-CM | POA: Insufficient documentation

## 2021-11-11 DIAGNOSIS — E860 Dehydration: Secondary | ICD-10-CM | POA: Insufficient documentation

## 2021-11-11 DIAGNOSIS — R160 Hepatomegaly, not elsewhere classified: Secondary | ICD-10-CM

## 2021-11-11 LAB — CBC WITH DIFFERENTIAL (CANCER CENTER ONLY)
Abs Immature Granulocytes: 0.01 10*3/uL (ref 0.00–0.07)
Basophils Absolute: 0 10*3/uL (ref 0.0–0.1)
Basophils Relative: 0 %
Eosinophils Absolute: 0 10*3/uL (ref 0.0–0.5)
Eosinophils Relative: 0 %
HCT: 45.1 % (ref 36.0–46.0)
Hemoglobin: 14.3 g/dL (ref 12.0–15.0)
Immature Granulocytes: 0 %
Lymphocytes Relative: 14 %
Lymphs Abs: 0.7 10*3/uL (ref 0.7–4.0)
MCH: 31.2 pg (ref 26.0–34.0)
MCHC: 31.7 g/dL (ref 30.0–36.0)
MCV: 98.5 fL (ref 80.0–100.0)
Monocytes Absolute: 0.7 10*3/uL (ref 0.1–1.0)
Monocytes Relative: 14 %
Neutro Abs: 3.3 10*3/uL (ref 1.7–7.7)
Neutrophils Relative %: 72 %
Platelet Count: 118 10*3/uL — ABNORMAL LOW (ref 150–400)
RBC: 4.58 MIL/uL (ref 3.87–5.11)
RDW: 16.3 % — ABNORMAL HIGH (ref 11.5–15.5)
WBC Count: 4.6 10*3/uL (ref 4.0–10.5)
nRBC: 0 % (ref 0.0–0.2)

## 2021-11-11 LAB — CMP (CANCER CENTER ONLY)
ALT: 9 U/L (ref 0–44)
AST: 22 U/L (ref 15–41)
Albumin: 3.8 g/dL (ref 3.5–5.0)
Alkaline Phosphatase: 164 U/L — ABNORMAL HIGH (ref 38–126)
Anion gap: 9 (ref 5–15)
BUN: 33 mg/dL — ABNORMAL HIGH (ref 8–23)
CO2: 30 mmol/L (ref 22–32)
Calcium: 9.7 mg/dL (ref 8.9–10.3)
Chloride: 99 mmol/L (ref 98–111)
Creatinine: 1.31 mg/dL — ABNORMAL HIGH (ref 0.44–1.00)
GFR, Estimated: 41 mL/min — ABNORMAL LOW (ref >60)
Glucose, Bld: 129 mg/dL — ABNORMAL HIGH (ref 70–99)
Potassium: 3.6 mmol/L (ref 3.5–5.1)
Sodium: 138 mmol/L (ref 135–145)
Total Bilirubin: 0.7 mg/dL (ref 0.3–1.2)
Total Protein: 7.2 g/dL (ref 6.5–8.1)

## 2021-11-11 MED ORDER — HEPARIN SOD (PORK) LOCK FLUSH 100 UNIT/ML IV SOLN
500.0000 [IU] | Freq: Once | INTRAVENOUS | Status: AC
  Administered 2021-11-11: 500 [IU] via INTRAVENOUS

## 2021-11-11 MED ORDER — SODIUM CHLORIDE 0.9 % IV SOLN
40.0000 mg | Freq: Once | INTRAVENOUS | Status: AC
  Administered 2021-11-11: 40 mg via INTRAVENOUS
  Filled 2021-11-11: qty 4

## 2021-11-11 MED ORDER — DEXAMETHASONE SODIUM PHOSPHATE 100 MG/10ML IJ SOLN
20.0000 mg | Freq: Once | INTRAMUSCULAR | Status: AC
  Administered 2021-11-11: 20 mg via INTRAVENOUS
  Filled 2021-11-11: qty 20

## 2021-11-11 MED ORDER — SODIUM CHLORIDE 0.9% FLUSH
10.0000 mL | Freq: Once | INTRAVENOUS | Status: AC
  Administered 2021-11-11: 10 mL via INTRAVENOUS

## 2021-11-11 MED ORDER — SODIUM CHLORIDE 0.9 % IV SOLN
INTRAVENOUS | Status: AC
Start: 2021-11-11 — End: 2021-11-11

## 2021-11-11 NOTE — Progress Notes (Signed)
?Hematology and Oncology Follow Up Visit ? ?Vanessa Garrett ?[id number] ?1940/10/14 81 y.o. ?11/11/2021 ? ? 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  ?Past Therapy: ?Carboplatin/Gemzar -- s/p cycle #12 -- started on 07/02/2020 -- d/c on 06/28/2021 ?  ?Current Therapy:        ?FOLFOX -- s/p cycle #3 -- start  on 07/04/2021 ?Durvalumab 10 mg/Kg IV q 3 weeks -- start on 11/26/2020 ?IV iron as indicated  ?Retacrit 40,000 units subcu 2x a week --done at home ?  ?Interim History:  Vanessa Garrett is here today with her son-in-law.  His wife and I think cystoscopy on the cell phone listening. ? ?We did go ahead and get another biopsy.  This was done on 10/20/2021.  The pathology report ([id number]) showed metastatic adenocarcinoma consistent with gallbladder primary. ? ?We did do molecular markers.  Unfortunately, there is no molecular mutations that we can target. ? ?She is weak.  She is not eating well.  She has been falling.  She obviously is declining.  She is dehydrated. ? ?I think her options are clearly limited.  Her performance status is just not that great.  We really only have chemotherapy that we could consider.  Again she just is not in great shape right now. ? ?Again she is dehydrated.  We will get have to try to see if we cannot rehydrate her and see if she feels a little bit better. ? ?She is not hurting.  She is having no diarrhea.  There is no nausea or vomiting. ? ?There is no bleeding. ? ?Her last CA 19-9 was of 946.  This is usually a very good marker for [place] as to whether or not she is progressing or responding. ? ?She has been getting Retacrit at home.  She definitely is not anemic. ? ?Currently, I would have to say that her performance status is probably ECOG 3. ? ? ?Medications:  ?Allergies as of 11/11/2021   ? ?   Reactions  ? Megace Es [megestrol Acetate]  Swelling  ? Facial swelling  ? ?  ? ?  ?Medication List  ?  ? ?  ? Accurate as of November 11, 2021  3:23 PM. If you have any questions, ask your nurse or doctor.  ?  ?  ? ?  ? ?dexamethasone 4 MG tablet ?Commonly known as: DECADRON ?Take 2 tablets (8 mg total) by mouth daily. Start the day after chemotherapy for 2 days. Take with food. ?  ?dronabinol 2.5 MG capsule ?Commonly known as: MARINOL ?Take 1 capsule (2.5 mg total) by mouth 2 (two) times daily before a meal. ?  ?furosemide 40 MG tablet ?Commonly known as: LASIX ?Take 40 mg by mouth daily. ?  ?Generlac 10 GM/15ML Soln ?Generic drug: lactulose (encephalopathy) ?Take 10 g by mouth daily as needed (constipation). ?  ?lidocaine-prilocaine cream ?Commonly known as: EMLA ?Apply to affected area once ?  ?lidocaine-prilocaine cream ?Commonly known as: EMLA ?Apply to affected area once ?  ?LORazepam 0.5 MG tablet ?Commonly known as: Ativan ?Take 1 tablet (0.5 mg total) by mouth every 6 (six) hours as needed (Nausea or vomiting). ?  ?metoprolol tartrate 25 MG tablet ?Commonly known as: LOPRESSOR ?TAKE 1 TABLET(25 MG) BY MOUTH TWICE DAILY ?  ?NON FORMULARY ?Take 2 tablets by mouth daily. Mega Food - Blood Builder ?  ?NON FORMULARY ?1.25 mg every other  day. Beet Root Powder ?  ?ondansetron 8 MG tablet ?Commonly known as: Zofran ?Take 1 tablet (8 mg total) by mouth 2 (two) times daily as needed for refractory nausea / vomiting. Start on day 3 after chemotherapy. ?  ?potassium chloride SA 20 MEQ tablet ?Commonly known as: KLOR-CON M ?Take 1 tablet (20 mEq total) by mouth 2 (two) times daily. ?  ?prochlorperazine 10 MG tablet ?Commonly known as: COMPAZINE ?Take 1 tablet (10 mg total) by mouth every 6 (six) hours as needed (Nausea or vomiting). ?  ?Retacrit 10000 UNIT/ML injection ?Generic drug: epoetin alfa-epbx ?INJECT 1 ML (10,000 UNITS TOTAL) TWICE A WEEK ?  ?S.S.S. TONIC PO ?Take 45 mLs by mouth every other day. ?  ?triamterene-hydrochlorothiazide 75-50 MG  tablet ?Commonly known as: MAXZIDE ?Take 1 tablet by mouth daily. ?  ?vitamin B-12 100 MCG tablet ?Commonly known as: CYANOCOBALAMIN ?Take 100 mcg by mouth in the morning and at bedtime. ?  ?vitamin D3 50 MCG (2000 UT) Caps ?Take by mouth daily. ?  ? ?  ? ? ?Allergies:  ?Allergies  ?Allergen Reactions  ? Megace Es [Megestrol Acetate] Swelling  ?  Facial swelling  ? ? ?Past Medical History, Surgical history, Social history, and Family History were reviewed and updated. ? ?Review of Systems: ?Review of Systems  ?Constitutional: Negative.   ?HENT: Negative.    ?Eyes: Negative.   ?Respiratory: Negative.    ?Cardiovascular:  Positive for leg swelling.  ?Gastrointestinal: Negative.   ?Genitourinary: Negative.   ?Musculoskeletal: Negative.   ?Skin: Negative.   ?Neurological: Negative.   ?Endo/Heme/Allergies: Negative.   ?Psychiatric/Behavioral: Negative.    ? ? ?Physical Exam: ? height is 5' 4.96" (1.65 m). Her oral temperature is 98.3 ?F (36.8 ?C). Her blood pressure is 125/77 and her pulse is 83. Her respiration is 17 and oxygen saturation is 99%.  ? ?Wt Readings from Last 3 Encounters:  ?10/19/21 68 kg  ?09/28/21 70.8 kg  ?09/06/21 68.5 kg  ? ?Physical Exam ?Vitals reviewed.  ?HENT:  ?   Head: Normocephalic and atraumatic.  ?Eyes:  ?   Pupils: Pupils are equal, round, and reactive to light.  ?Cardiovascular:  ?   Rate and Rhythm: Normal rate and regular rhythm.  ?   Heart sounds: Normal heart sounds.  ?Pulmonary:  ?   Effort: Pulmonary effort is normal.  ?   Breath sounds: Normal breath sounds.  ?Abdominal:  ?   General: Bowel sounds are normal.  ?   Palpations: Abdomen is soft.  ?Musculoskeletal:     ?   General: No tenderness or deformity. Normal range of motion.  ?   Cervical back: Normal range of motion.  ?   Comments: Extremities shows some swelling in the lower extremities.  She has more swelling in the left leg than on the right leg.  This is about 2+ pitting in the left leg.  ?Lymphadenopathy:  ?   Cervical: No  cervical adenopathy.  ?Skin: ?   General: Skin is warm and dry.  ?   Findings: No erythema or rash.  ?Neurological:  ?   Mental Status: She is alert and oriented to person, place, and time.  ?Psychiatric:     ?   Behavior: Behavior normal.     ?   Thought Content: Thought content normal.     ?   Judgment: Judgment normal.  ? ?  ? ?Lab Results  ?Component Value Date  ? WBC 4.6 11/11/2021  ? HGB 14.3 11/11/2021  ?  HCT 45.1 11/11/2021  ? MCV 98.5 11/11/2021  ? PLT 118 (L) 11/11/2021  ? ?Lab Results  ?Component Value Date  ? FERRITIN 1,705 (H) 09/28/2021  ? IRON 32 09/28/2021  ? TIBC 276 09/28/2021  ? UIBC 244 09/28/2021  ? IRONPCTSAT 12 09/28/2021  ? ?Lab Results  ?Component Value Date  ? RETICCTPCT 2.2 08/08/2021  ? RBC 4.58 11/11/2021  ? ?No results found for: KPAFRELGTCHN, LAMBDASER, KAPLAMBRATIO ?No results found for: IGGSERUM, IGA, IGMSERUM ?No results found for: TOTALPROTELP, ALBUMINELP, A1GS, A2GS, BETS, BETA2SER, GAMS, MSPIKE, SPEI ?  Chemistry   ?   ?Component Value Date/Time  ? NA 138 11/11/2021 1227  ? K 3.6 11/11/2021 1227  ? CL 99 11/11/2021 1227  ? CO2 30 11/11/2021 1227  ? BUN 33 (H) 11/11/2021 1227  ? CREATININE 1.31 (H) 11/11/2021 1227  ?    ?Component Value Date/Time  ? CALCIUM 9.7 11/11/2021 1227  ? ALKPHOS 164 (H) 11/11/2021 1227  ? AST 22 11/11/2021 1227  ? ALT 9 11/11/2021 1227  ? BILITOT 0.7 11/11/2021 1227  ?  ? ? ? ?Impression and Plan: Vanessa Garrett is a very pleasant 81 yo [place] American female with stage IV adenocarcinoma of the gallbladder.  ? ?She she has done pretty well considering her chronic cancer and her age.  She has had a wonderful support from her family.  She had a decent quality of life. ? ?Again, is hard to say will not work will be able to treat her with chemotherapy.  Again I think the only option would be liposomal irinotecan with 5-FU.  I just do not know if she can tolerate this right now. ? ?We will see about given some IV fluid today.  Give her some IV Pepcid and  Decadron to see if this may perk her up a little bit. ? ?We will have to get her back on Monday and see if she feels a little bit better. ? ?I think if we cannot get her better with IV fluid, then we just may not be able

## 2021-11-11 NOTE — Progress Notes (Signed)
Patient seen today for possible new treatment. She is deconditioned and will get fluids today, and we will follow up Monday to see how she responds.  ? ?Oncology Nurse Navigator Documentation ? ?Oncology Nurse Navigator Flowsheets 11/11/2021  ?Abnormal Finding Date -  ?Planned Course of Treatment -  ?Phase of Treatment -  ?Chemotherapy Actual Start Date: -  ?Navigator Follow Up Date: 11/15/2021  ?Navigator Follow Up Reason: Appointment Review  ?Navigator Location [facility]  ?Referral Date to RadOnc/MedOnc -  ?Navigator Encounter Type Appt/Treatment Plan Review  ?Telephone -  ?Treatment Initiated Date -  ?Patient Visit Type MedOnc  ?Treatment Phase Active Tx  ?Barriers/Navigation Needs Coordination of Care;Education  ?Education -  ?Interventions None Required  ?Acuity Level 2-Minimal Needs (1-2 Barriers Identified)  ?Referrals -  ?Coordination of Care -  ?Education Method -  ?Support Groups/Services Friends and Family  ?Time Spent with Patient 15  ?  ?

## 2021-11-11 NOTE — Patient Instructions (Signed)

## 2021-11-11 NOTE — Patient Instructions (Signed)

## 2021-11-12 ENCOUNTER — Other Ambulatory Visit: Payer: Self-pay | Admitting: Hematology & Oncology

## 2021-11-12 LAB — T4: T4, Total: 10.1 ug/dL (ref 4.5–12.0)

## 2021-11-12 LAB — CANCER ANTIGEN 19-9: CA 19-9: 2045 U/mL — ABNORMAL HIGH (ref 0–35)

## 2021-11-14 ENCOUNTER — Inpatient Hospital Stay: Payer: Medicare PPO | Admitting: Hematology & Oncology

## 2021-11-14 ENCOUNTER — Inpatient Hospital Stay: Payer: Medicare PPO

## 2021-11-14 ENCOUNTER — Encounter: Payer: Self-pay | Admitting: Hematology & Oncology

## 2021-11-14 ENCOUNTER — Telehealth: Payer: Self-pay | Admitting: *Deleted

## 2021-11-14 ENCOUNTER — Other Ambulatory Visit: Payer: Self-pay

## 2021-11-14 VITALS — BP 166/70 | HR 70 | Temp 97.8°F | Resp 17 | Wt 133.0 lb

## 2021-11-14 DIAGNOSIS — C23 Malignant neoplasm of gallbladder: Secondary | ICD-10-CM | POA: Diagnosis not present

## 2021-11-14 DIAGNOSIS — I10 Essential (primary) hypertension: Secondary | ICD-10-CM

## 2021-11-14 LAB — CBC WITH DIFFERENTIAL (CANCER CENTER ONLY)
Abs Immature Granulocytes: 0.01 10*3/uL (ref 0.00–0.07)
Basophils Absolute: 0 10*3/uL (ref 0.0–0.1)
Basophils Relative: 1 %
Eosinophils Absolute: 0 10*3/uL (ref 0.0–0.5)
Eosinophils Relative: 0 %
HCT: 46.9 % — ABNORMAL HIGH (ref 36.0–46.0)
Hemoglobin: 15 g/dL (ref 12.0–15.0)
Immature Granulocytes: 0 %
Lymphocytes Relative: 12 %
Lymphs Abs: 0.7 10*3/uL (ref 0.7–4.0)
MCH: 31.4 pg (ref 26.0–34.0)
MCHC: 32 g/dL (ref 30.0–36.0)
MCV: 98.1 fL (ref 80.0–100.0)
Monocytes Absolute: 0.9 10*3/uL (ref 0.1–1.0)
Monocytes Relative: 15 %
Neutro Abs: 4.4 10*3/uL (ref 1.7–7.7)
Neutrophils Relative %: 72 %
Platelet Count: 121 10*3/uL — ABNORMAL LOW (ref 150–400)
RBC: 4.78 MIL/uL (ref 3.87–5.11)
RDW: 15.9 % — ABNORMAL HIGH (ref 11.5–15.5)
WBC Count: 6 10*3/uL (ref 4.0–10.5)
nRBC: 0 % (ref 0.0–0.2)

## 2021-11-14 LAB — CMP (CANCER CENTER ONLY)
ALT: 10 U/L (ref 0–44)
AST: 24 U/L (ref 15–41)
Albumin: 3.8 g/dL (ref 3.5–5.0)
Alkaline Phosphatase: 171 U/L — ABNORMAL HIGH (ref 38–126)
Anion gap: 7 (ref 5–15)
BUN: 37 mg/dL — ABNORMAL HIGH (ref 8–23)
CO2: 31 mmol/L (ref 22–32)
Calcium: 9.6 mg/dL (ref 8.9–10.3)
Chloride: 100 mmol/L (ref 98–111)
Creatinine: 1.15 mg/dL — ABNORMAL HIGH (ref 0.44–1.00)
GFR, Estimated: 48 mL/min — ABNORMAL LOW (ref >60)
Glucose, Bld: 125 mg/dL — ABNORMAL HIGH (ref 70–99)
Potassium: 3.5 mmol/L (ref 3.5–5.1)
Sodium: 138 mmol/L (ref 135–145)
Total Bilirubin: 0.6 mg/dL (ref 0.3–1.2)
Total Protein: 7 g/dL (ref 6.5–8.1)

## 2021-11-14 LAB — LACTATE DEHYDROGENASE: LDH: 471 U/L — ABNORMAL HIGH (ref 98–192)

## 2021-11-14 LAB — PREALBUMIN: Prealbumin: 16 mg/dL — ABNORMAL LOW (ref 18–38)

## 2021-11-14 LAB — TSH: TSH: 1.268 u[IU]/mL (ref 0.308–3.960)

## 2021-11-14 MED ORDER — LORAZEPAM 2 MG/ML IJ SOLN: 0.5000 mg | Freq: Once | INTRAMUSCULAR | Status: DC

## 2021-11-14 MED ORDER — SODIUM CHLORIDE 0.9 % IV SOLN: INTRAVENOUS | Status: DC

## 2021-11-14 MED ORDER — LORAZEPAM 2 MG/ML IJ SOLN
0.2500 mg | Freq: Once | INTRAMUSCULAR | Status: AC
  Administered 2021-11-14: 0.25 mg via INTRAVENOUS
  Filled 2021-11-14: qty 1

## 2021-11-14 NOTE — Telephone Encounter (Signed)
Referral made to [facility] per dr Marin Olp request.   ?

## 2021-11-14 NOTE — Patient Instructions (Signed)

## 2021-11-14 NOTE — Patient Instructions (Signed)

## 2021-11-14 NOTE — Progress Notes (Signed)
?Hematology and Oncology Follow Up Visit ? ?Vanessa Garrett ?[id number] ?[birth year] 81 y.o. ?11/14/2021 ? ? 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  ?Past Therapy: ?Carboplatin/Gemzar -- s/p cycle #12 -- started on 07/02/2020 -- d/c on 06/28/2021 ?  ?Current Therapy:        ?FOLFOX -- s/p cycle #3 -- start  on 07/04/2021 ?Durvalumab 10 mg/Kg IV q 3 weeks -- start on 11/26/2020 ?IV iron as indicated  ?Retacrit 40,000 units subcu 2x a week --done at home ?  ?Interim History:  Vanessa Garrett is here today with her daughter.  Unfortunately, she just is no better.  The fluids really did not help all that much over the weekend and.  She still is not eating much.  She said that she feels miserable. ? ?I think the important aspect of what is going on is the fact that her CA 19-9 is now over 2000.  I think this is a clear indicator that this tumor is becoming much more aggressive. ? ?I just do not believe that she is going to be a candidate for any additional systemic therapy.  She has done incredibly well with metastatic gallbladder cancer for over a year.  She has had a good quality of life for most of this. ? ?I had a long talk with she and her daughter.  I think we have to get hospice involved now.  [facility] I think would be a reasonable way to go.  We will make a referral. ? ?I just hate the fact that she is progressing and that she is declining.  Again, she is done incredibly well.  She has done everything that we have asked her to do. ? ?I did talk to her about end-of-life issues.  She does not want to be kept alive by machines.  I told agree with this.  I think if she were to be kept alive artificially, she would absolutely not get off life support and would not have a miserable existence. ? ?As such, she is a DO NOT RESUSCITATE. ? ?She just seems to be  uncomfortable.  I will try little Ativan with her to see if this may not help her a little bit. ? ?She has had no vomiting.  She does feel little nauseated.  She is not having any diarrhea.  There is no bleeding. ? ?She has had no cough.  There is no shortness of breath. ? ?Overall, I would say performance status is probably ECOG 3-4.   ? ? ? ?Medications:  ?Allergies as of 11/14/2021   ? ?   Reactions  ? Megace Es [megestrol Acetate] Swelling  ? Facial swelling  ? ?  ? ?  ?Medication List  ?  ? ?  ? Accurate as of November 14, 2021  3:10 PM. If you have any questions, ask your nurse or doctor.  ?  ?  ? ?  ? ?dexamethasone 4 MG tablet ?Commonly known as: DECADRON ?Take 2 tablets (8 mg total) by mouth daily. Start the day after chemotherapy for 2 days. Take with food. ?  ?dronabinol 2.5 MG capsule ?Commonly known as: MARINOL ?Take 1 capsule (2.5 mg total) by mouth 2 (two) times daily before a meal. ?  ?furosemide 40 MG tablet ?Commonly known as: LASIX ?Take 40 mg by mouth daily. ?  ?Generlac 10 GM/15ML Soln ?Generic drug:  lactulose (encephalopathy) ?Take 10 g by mouth daily as needed (constipation). ?  ?lidocaine-prilocaine cream ?Commonly known as: EMLA ?Apply to affected area once ?  ?lidocaine-prilocaine cream ?Commonly known as: EMLA ?Apply to affected area once ?  ?LORazepam 0.5 MG tablet ?Commonly known as: Ativan ?Take 1 tablet (0.5 mg total) by mouth every 6 (six) hours as needed (Nausea or vomiting). ?  ?metoprolol tartrate 25 MG tablet ?Commonly known as: LOPRESSOR ?TAKE 1 TABLET(25 MG) BY MOUTH TWICE DAILY ?  ?NON FORMULARY ?Take 2 tablets by mouth daily. Mega Food - Blood Builder ?  ?NON FORMULARY ?1.25 mg every other day. Beet Root Powder ?  ?ondansetron 8 MG tablet ?Commonly known as: Zofran ?Take 1 tablet (8 mg total) by mouth 2 (two) times daily as needed for refractory nausea / vomiting. Start on day 3 after chemotherapy. ?  ?potassium chloride SA 20 MEQ tablet ?Commonly known as: KLOR-CON M ?Take 1  tablet (20 mEq total) by mouth 2 (two) times daily. ?  ?prochlorperazine 10 MG tablet ?Commonly known as: COMPAZINE ?Take 1 tablet (10 mg total) by mouth every 6 (six) hours as needed (Nausea or vomiting). ?  ?Retacrit 10000 UNIT/ML injection ?Generic drug: epoetin alfa-epbx ?INJECT 1 ML (10,000 UNITS TOTAL) TWICE A WEEK ?  ?S.S.S. TONIC PO ?Take 45 mLs by mouth every other day. ?  ?triamterene-hydrochlorothiazide 75-50 MG tablet ?Commonly known as: MAXZIDE ?TAKE 1 TABLET BY MOUTH DAILY ?  ?vitamin B-12 100 MCG tablet ?Commonly known as: CYANOCOBALAMIN ?Take 100 mcg by mouth in the morning and at bedtime. ?  ?vitamin D3 50 MCG (2000 UT) Caps ?Take by mouth daily. ?  ? ?  ? ? ?Allergies:  ?Allergies  ?Allergen Reactions  ? Megace Es [Megestrol Acetate] Swelling  ?  Facial swelling  ? ? ?Past Medical History, Surgical history, Social history, and Family History were reviewed and updated. ? ?Review of Systems: ?Review of Systems  ?Constitutional: Negative.   ?HENT: Negative.    ?Eyes: Negative.   ?Respiratory: Negative.    ?Cardiovascular:  Positive for leg swelling.  ?Gastrointestinal: Negative.   ?Genitourinary: Negative.   ?Musculoskeletal: Negative.   ?Skin: Negative.   ?Neurological: Negative.   ?Endo/Heme/Allergies: Negative.   ?Psychiatric/Behavioral: Negative.    ? ? ?Physical Exam: ? weight is 133 lb (60.3 kg). Her oral temperature is 97.8 ?F (36.6 ?C). Her blood pressure is 166/70 (abnormal) and her pulse is 70. Her respiration is 17 and oxygen saturation is 98%.  ? ?Wt Readings from Last 3 Encounters:  ?11/14/21 133 lb (60.3 kg)  ?10/19/21 150 lb (68 kg)  ?09/28/21 156 lb (70.8 kg)  ? ?Physical Exam ?Vitals reviewed.  ?HENT:  ?   Head: Normocephalic and atraumatic.  ?Eyes:  ?   Pupils: Pupils are equal, round, and reactive to light.  ?Cardiovascular:  ?   Rate and Rhythm: Normal rate and regular rhythm.  ?   Heart sounds: Normal heart sounds.  ?Pulmonary:  ?   Effort: Pulmonary effort is normal.  ?   Breath  sounds: Normal breath sounds.  ?Abdominal:  ?   General: Bowel sounds are normal.  ?   Palpations: Abdomen is soft.  ?Musculoskeletal:     ?   General: No tenderness or deformity. Normal range of motion.  ?   Cervical back: Normal range of motion.  ?   Comments: Extremities shows some swelling in the lower extremities.  She has more swelling in the left leg than on the right leg.  This is about 2+ pitting in the left leg.  ?Lymphadenopathy:  ?   Cervical: No cervical adenopathy.  ?Skin: ?   General: Skin is warm and dry.  ?   Findings: No erythema or rash.  ?Neurological:  ?   Mental Status: She is alert and oriented to person, place, and time.  ?Psychiatric:     ?   Behavior: Behavior normal.     ?   Thought Content: Thought content normal.     ?   Judgment: Judgment normal.  ? ?  ? ?Lab Results  ?Component Value Date  ? WBC 6.0 11/14/2021  ? HGB 15.0 11/14/2021  ? HCT 46.9 (H) 11/14/2021  ? MCV 98.1 11/14/2021  ? PLT 121 (L) 11/14/2021  ? ?Lab Results  ?Component Value Date  ? FERRITIN 1,705 (H) 09/28/2021  ? IRON 32 09/28/2021  ? TIBC 276 09/28/2021  ? UIBC 244 09/28/2021  ? IRONPCTSAT 12 09/28/2021  ? ?Lab Results  ?Component Value Date  ? RETICCTPCT 2.2 08/08/2021  ? RBC 4.78 11/14/2021  ? ?No results found for: KPAFRELGTCHN, LAMBDASER, KAPLAMBRATIO ?No results found for: IGGSERUM, IGA, IGMSERUM ?No results found for: TOTALPROTELP, ALBUMINELP, A1GS, A2GS, BETS, BETA2SER, GAMS, MSPIKE, SPEI ?  Chemistry   ?   ?Component Value Date/Time  ? NA 138 11/14/2021 1400  ? K 3.5 11/14/2021 1400  ? CL 100 11/14/2021 1400  ? CO2 31 11/14/2021 1400  ? BUN 37 (H) 11/14/2021 1400  ? CREATININE 1.15 (H) 11/14/2021 1400  ?    ?Component Value Date/Time  ? CALCIUM 9.6 11/14/2021 1400  ? ALKPHOS 171 (H) 11/14/2021 1400  ? AST 24 11/14/2021 1400  ? ALT 10 11/14/2021 1400  ? BILITOT 0.6 11/14/2021 1400  ?  ? ? ? ?Impression and Plan: Ms. Putzier is a very pleasant 81 yo [place] American female with stage IV adenocarcinoma of the  gallbladder.  ? ?We are now at a point where we have to consider comfort care.  Again, I think hospice would be appropriate for her.  I think her family is ready for this.  They certainly would be amenable to

## 2021-11-15 ENCOUNTER — Encounter: Payer: Self-pay | Admitting: *Deleted

## 2021-11-15 LAB — IRON AND IRON BINDING CAPACITY (CC-WL,HP ONLY)
Iron: 49 ug/dL (ref 28–170)
Saturation Ratios: 21 % (ref 10.4–31.8)
TIBC: 230 ug/dL — ABNORMAL LOW (ref 250–450)
UIBC: 181 ug/dL (ref 148–442)

## 2021-11-15 LAB — CANCER ANTIGEN 19-9: CA 19-9: 2214 U/mL — ABNORMAL HIGH (ref 0–35)

## 2021-11-15 LAB — FERRITIN: Ferritin: 1791 ng/mL — ABNORMAL HIGH (ref 11–307)

## 2021-11-15 LAB — T4: T4, Total: 9.4 ug/dL (ref 4.5–12.0)

## 2021-11-15 LAB — TSH: TSH: 1.296 u[IU]/mL (ref 0.308–3.960)

## 2021-11-15 NOTE — Progress Notes (Signed)
Patient and family seen in the office yesterday to establish plan for patient. It was decided that she will enroll in hospice due to progression of disease and poor functional status. Will discontinue active navigation, however will still be available to the patient and family as needed. ? ?Oncology Nurse Navigator Documentation ? ?Oncology Nurse Navigator Flowsheets 11/15/2021  ?Abnormal Finding Date -  ?Planned Course of Treatment -  ?Phase of Treatment -  ?Chemotherapy Actual Start Date: -  ?Navigator Follow Up Date: -  ?Navigator Follow Up Reason: -  ?Navigation Complete Date: 11/15/2021  ?Post Navigation: Continue to Follow Patient? No  ?Reason Not Navigating Patient: Hospice/Death  ?Navigator Location [facility]  ?Referral Date to RadOnc/MedOnc -  ?Navigator Encounter Type Appt/Treatment Plan Review  ?Telephone -  ?Treatment Initiated Date -  ?Patient Visit Type MedOnc  ?Treatment Phase Other  ?Barriers/Navigation Needs -  ?Education -  ?Interventions -  ?Acuity -  ?Referrals -  ?Coordination of Care -  ?Education Method -  ?Support Groups/Services Friends and Family  ?Time Spent with Patient 15  ?   ?

## 2021-12-07 ENCOUNTER — Other Ambulatory Visit: Payer: Self-pay | Admitting: Hematology & Oncology

## 2022-09-19 ENCOUNTER — Other Ambulatory Visit: Payer: Self-pay | Admitting: Pharmacist
